# Patient Record
Sex: Female | Born: 1940 | Hispanic: No | Marital: Married | Smoking: Former smoker
Health system: Southern US, Community
[De-identification: ages and names within clinical notes are randomized; demographics above are authoritative.]

## PROBLEM LIST (undated history)

## (undated) DIAGNOSIS — Z9851 Tubal ligation status: Secondary | ICD-10-CM

## (undated) DIAGNOSIS — F32A Depression, unspecified: Secondary | ICD-10-CM

## (undated) DIAGNOSIS — Z9889 Other specified postprocedural states: Secondary | ICD-10-CM

## (undated) DIAGNOSIS — I219 Acute myocardial infarction, unspecified: Secondary | ICD-10-CM

## (undated) DIAGNOSIS — E039 Hypothyroidism, unspecified: Secondary | ICD-10-CM

## (undated) DIAGNOSIS — F0280 Dementia in other diseases classified elsewhere without behavioral disturbance: Secondary | ICD-10-CM

## (undated) DIAGNOSIS — F329 Major depressive disorder, single episode, unspecified: Secondary | ICD-10-CM

## (undated) DIAGNOSIS — E0590 Thyrotoxicosis, unspecified without thyrotoxic crisis or storm: Secondary | ICD-10-CM

## (undated) DIAGNOSIS — E538 Deficiency of other specified B group vitamins: Secondary | ICD-10-CM

## (undated) DIAGNOSIS — M1990 Unspecified osteoarthritis, unspecified site: Secondary | ICD-10-CM

## (undated) DIAGNOSIS — M810 Age-related osteoporosis without current pathological fracture: Secondary | ICD-10-CM

## (undated) DIAGNOSIS — F0390 Unspecified dementia without behavioral disturbance: Secondary | ICD-10-CM

## (undated) HISTORY — DX: Tubal ligation status: Z98.51

## (undated) HISTORY — DX: Dementia in other diseases classified elsewhere, unspecified severity, without behavioral disturbance, psychotic disturbance, mood disturbance, and anxiety: F02.80

## (undated) HISTORY — DX: Other specified postprocedural states: Z98.890

## (undated) HISTORY — PX: OTHER SURGICAL HISTORY: SHX169

## (undated) HISTORY — DX: Depression, unspecified: F32.A

## (undated) HISTORY — PX: TUBAL LIGATION: SHX77

## (undated) HISTORY — PX: REDUCTION MAMMAPLASTY: SUR839

## (undated) HISTORY — PX: BREAST SURGERY: SHX581

## (undated) HISTORY — DX: Age-related osteoporosis without current pathological fracture: M81.0

## (undated) HISTORY — DX: Major depressive disorder, single episode, unspecified: F32.9

## (undated) HISTORY — DX: Deficiency of other specified B group vitamins: E53.8

## (undated) HISTORY — PX: TONSILLECTOMY: SUR1361

## (undated) HISTORY — DX: Thyrotoxicosis, unspecified without thyrotoxic crisis or storm: E05.90

---

## 1997-11-13 ENCOUNTER — Ambulatory Visit (HOSPITAL_COMMUNITY): Admission: RE | Admit: 1997-11-13 | Discharge: 1997-11-13 | Payer: Self-pay | Admitting: Nephrology

## 1997-11-20 ENCOUNTER — Ambulatory Visit (HOSPITAL_COMMUNITY): Admission: RE | Admit: 1997-11-20 | Discharge: 1997-11-20 | Payer: Self-pay | Admitting: Nephrology

## 1998-07-06 ENCOUNTER — Encounter: Payer: Self-pay | Admitting: Nephrology

## 1998-07-06 ENCOUNTER — Ambulatory Visit (HOSPITAL_COMMUNITY): Admission: RE | Admit: 1998-07-06 | Discharge: 1998-07-06 | Payer: Self-pay | Admitting: Nephrology

## 1999-02-20 ENCOUNTER — Emergency Department (HOSPITAL_COMMUNITY): Admission: EM | Admit: 1999-02-20 | Discharge: 1999-02-20 | Payer: Self-pay | Admitting: Emergency Medicine

## 1999-02-20 ENCOUNTER — Encounter: Payer: Self-pay | Admitting: Emergency Medicine

## 1999-12-27 ENCOUNTER — Ambulatory Visit (HOSPITAL_BASED_OUTPATIENT_CLINIC_OR_DEPARTMENT_OTHER): Admission: RE | Admit: 1999-12-27 | Discharge: 1999-12-27 | Payer: Self-pay | Admitting: Orthopedic Surgery

## 2001-12-25 ENCOUNTER — Encounter: Payer: Self-pay | Admitting: Nephrology

## 2001-12-25 ENCOUNTER — Encounter: Admission: RE | Admit: 2001-12-25 | Discharge: 2001-12-25 | Payer: Self-pay | Admitting: Nephrology

## 2002-11-08 ENCOUNTER — Emergency Department (HOSPITAL_COMMUNITY): Admission: EM | Admit: 2002-11-08 | Discharge: 2002-11-08 | Payer: Self-pay | Admitting: Emergency Medicine

## 2002-11-08 ENCOUNTER — Encounter: Payer: Self-pay | Admitting: Emergency Medicine

## 2002-11-29 ENCOUNTER — Encounter: Payer: Self-pay | Admitting: Nephrology

## 2002-11-29 ENCOUNTER — Ambulatory Visit (HOSPITAL_COMMUNITY): Admission: RE | Admit: 2002-11-29 | Discharge: 2002-11-29 | Payer: Self-pay | Admitting: Nephrology

## 2002-12-09 ENCOUNTER — Encounter: Payer: Self-pay | Admitting: Cardiology

## 2002-12-09 ENCOUNTER — Ambulatory Visit (HOSPITAL_COMMUNITY): Admission: RE | Admit: 2002-12-09 | Discharge: 2002-12-09 | Payer: Self-pay | Admitting: Cardiology

## 2003-03-20 ENCOUNTER — Other Ambulatory Visit: Admission: RE | Admit: 2003-03-20 | Discharge: 2003-03-20 | Payer: Self-pay | Admitting: Gynecology

## 2003-11-12 ENCOUNTER — Ambulatory Visit (HOSPITAL_COMMUNITY): Admission: RE | Admit: 2003-11-12 | Discharge: 2003-11-12 | Payer: Self-pay | Admitting: Neurosurgery

## 2004-03-12 ENCOUNTER — Ambulatory Visit (HOSPITAL_COMMUNITY): Admission: RE | Admit: 2004-03-12 | Discharge: 2004-03-12 | Payer: Self-pay | Admitting: Nephrology

## 2004-03-22 ENCOUNTER — Other Ambulatory Visit: Admission: RE | Admit: 2004-03-22 | Discharge: 2004-03-22 | Payer: Self-pay | Admitting: Gynecology

## 2004-05-01 ENCOUNTER — Encounter: Admission: RE | Admit: 2004-05-01 | Discharge: 2004-05-01 | Payer: Self-pay | Admitting: Orthopedic Surgery

## 2005-03-31 ENCOUNTER — Other Ambulatory Visit: Admission: RE | Admit: 2005-03-31 | Discharge: 2005-03-31 | Payer: Self-pay | Admitting: Gynecology

## 2005-12-06 ENCOUNTER — Ambulatory Visit (HOSPITAL_COMMUNITY): Admission: RE | Admit: 2005-12-06 | Discharge: 2005-12-06 | Payer: Self-pay | Admitting: Gastroenterology

## 2005-12-06 ENCOUNTER — Encounter (INDEPENDENT_AMBULATORY_CARE_PROVIDER_SITE_OTHER): Payer: Self-pay | Admitting: Specialist

## 2006-04-03 ENCOUNTER — Other Ambulatory Visit: Admission: RE | Admit: 2006-04-03 | Discharge: 2006-04-03 | Payer: Self-pay | Admitting: Gynecology

## 2007-04-05 ENCOUNTER — Other Ambulatory Visit: Admission: RE | Admit: 2007-04-05 | Discharge: 2007-04-05 | Payer: Self-pay | Admitting: Gynecology

## 2010-03-31 ENCOUNTER — Encounter: Admission: RE | Admit: 2010-03-31 | Discharge: 2010-03-31 | Payer: Self-pay | Admitting: Nephrology

## 2010-10-08 NOTE — Op Note (Signed)
[place]  Patient:    [name], [name]                      MRN: [id number] Proc. Date: 12/27/99 Adm. Date:  [id number] Attending:  Aldean Baker V                           Operative Report  PREOPERATIVE DIAGNOSIS:  Metatarsalgia left foot second metatarsal head.  POSTOPERATIVE DIAGNOSIS:  Metatarsalgia left foot second metatarsal head.  PROCEDURE:  Shortening osteotomy left second metatarsal.  SURGEON:  Nadara Mustard, M.D.  ANESTHESIA:  General endotracheal anesthesia.  ESTIMATED BLOOD LOSS:  Minimal  ANTIBIOTICS:  1 gram of Kefzol.  DRAINS:  None.  COMPLICATIONS:  None.  TOURNIQUET TIME: With the Esmarch at the ankle at 30 minutes.  DISPOSITION:  To PACU in stable condition.  INDICATIONS FOR PROCEDURE:  The patient is a 70 year old woman who previously had undergone a bunion procedure for her left great toe.  She had a significant amount of shortening and dorsal angulation of the first metatarsal after her bunion procedure and subsequent to this has had increasing pain over the second metatarsal head.  The patient has undergone conservative care with wide shoes custom orthotics to unload the metatarsal heads and has been able to perform activities of daily living, unable to walk barefoot in the house and presents at this time for shortening osteotomy of the second metatarsal.  Risks and benefits were discussed including infection, neurovascular injury, persistent pain, nonunion at the osteotomy site, failure of the hardware, need for additional surgery.  The patient states she understands and wishes to proceed at this time.  DESCRIPTION OF PROCEDURE:  The patient was brought to outpatient room 2 and underwent a general anesthetic.  After adequate level of anesthesia was obtained the patients left lower extremity was prepped using DuraPrep and draped into a sterile field.  A dorsal incision was made over the second metatarsal.  The extensor tendons were reflected laterally and medially and an oblique osteotomy was made in the mid shaft with approximately 4 cm of bone resected.  The osteotomy was shortened and stabilized with lab screw technique and reinforced with a 20 gauge cerclage wire.  C-arm fluoroscopy showed good alignment and proper length of the metatarsal compared to the other bones. The wound was irrigated with normal saline.  The skin incision was closed using a vertical mattress with 2-0 nylon.  The wound was covered with Adaptic, orthopedic sponges, sterile Webril and a loosely wrapped Coban.  The patient was extubated and taken to PACU in stable condition. Total Esmarch time at the ankle approximately 30 minutes.  PLAN:  Followup in the office in 2 weeks, and nonweightbearing on the left lower extremity. DD:  12/27/99 TD:  12/28/99 Job: [id number]

## 2011-02-16 ENCOUNTER — Other Ambulatory Visit: Payer: Self-pay | Admitting: Nephrology

## 2011-02-16 ENCOUNTER — Ambulatory Visit
Admission: RE | Admit: 2011-02-16 | Discharge: 2011-02-16 | Disposition: A | Discharge status: Home / Self Care | Payer: Medicare HMO | Source: Ambulatory Visit | Attending: Nephrology | Admitting: Nephrology

## 2011-02-16 DIAGNOSIS — R059 Cough, unspecified: Secondary | ICD-10-CM

## 2011-02-16 DIAGNOSIS — R05 Cough: Secondary | ICD-10-CM

## 2011-10-14 ENCOUNTER — Ambulatory Visit: Payer: Medicare Other | Attending: Podiatry | Admitting: Physical Therapy

## 2011-10-14 DIAGNOSIS — M25676 Stiffness of unspecified foot, not elsewhere classified: Secondary | ICD-10-CM | POA: Insufficient documentation

## 2011-10-14 DIAGNOSIS — R262 Difficulty in walking, not elsewhere classified: Secondary | ICD-10-CM | POA: Insufficient documentation

## 2011-10-14 DIAGNOSIS — M25579 Pain in unspecified ankle and joints of unspecified foot: Secondary | ICD-10-CM | POA: Insufficient documentation

## 2011-10-14 DIAGNOSIS — IMO0001 Reserved for inherently not codable concepts without codable children: Secondary | ICD-10-CM | POA: Insufficient documentation

## 2011-10-14 DIAGNOSIS — M25673 Stiffness of unspecified ankle, not elsewhere classified: Secondary | ICD-10-CM | POA: Insufficient documentation

## 2011-10-19 ENCOUNTER — Ambulatory Visit: Payer: Medicare Other | Admitting: Physical Therapy

## 2011-10-21 ENCOUNTER — Ambulatory Visit: Payer: Medicare Other | Admitting: Physical Therapy

## 2011-10-24 ENCOUNTER — Ambulatory Visit: Payer: Medicare Other | Attending: Podiatry | Admitting: Physical Therapy

## 2011-10-24 DIAGNOSIS — M25673 Stiffness of unspecified ankle, not elsewhere classified: Secondary | ICD-10-CM | POA: Insufficient documentation

## 2011-10-24 DIAGNOSIS — M25676 Stiffness of unspecified foot, not elsewhere classified: Secondary | ICD-10-CM | POA: Insufficient documentation

## 2011-10-24 DIAGNOSIS — IMO0001 Reserved for inherently not codable concepts without codable children: Secondary | ICD-10-CM | POA: Insufficient documentation

## 2011-10-24 DIAGNOSIS — R262 Difficulty in walking, not elsewhere classified: Secondary | ICD-10-CM | POA: Insufficient documentation

## 2011-10-24 DIAGNOSIS — M25579 Pain in unspecified ankle and joints of unspecified foot: Secondary | ICD-10-CM | POA: Insufficient documentation

## 2011-10-26 ENCOUNTER — Ambulatory Visit: Payer: Medicare Other | Admitting: Physical Therapy

## 2011-10-28 ENCOUNTER — Ambulatory Visit: Payer: Medicare Other | Admitting: Physical Therapy

## 2011-10-31 ENCOUNTER — Ambulatory Visit: Payer: Medicare Other | Admitting: Physical Therapy

## 2011-11-02 ENCOUNTER — Ambulatory Visit: Payer: Medicare Other | Admitting: Physical Therapy

## 2011-11-07 ENCOUNTER — Ambulatory Visit: Payer: Medicare Other | Admitting: Physical Therapy

## 2011-11-09 ENCOUNTER — Ambulatory Visit: Payer: Medicare Other | Admitting: Physical Therapy

## 2011-11-11 ENCOUNTER — Ambulatory Visit: Payer: Medicare Other | Admitting: Physical Therapy

## 2011-11-16 ENCOUNTER — Encounter: Payer: Medicare HMO | Admitting: Physical Therapy

## 2011-11-18 ENCOUNTER — Encounter: Payer: Medicare HMO | Admitting: Physical Therapy

## 2011-12-07 ENCOUNTER — Other Ambulatory Visit: Payer: Self-pay | Admitting: Nephrology

## 2012-03-02 ENCOUNTER — Other Ambulatory Visit: Payer: Self-pay | Admitting: Obstetrics and Gynecology

## 2012-03-29 ENCOUNTER — Other Ambulatory Visit: Payer: Self-pay | Admitting: Orthopedic Surgery

## 2012-03-29 ENCOUNTER — Encounter (HOSPITAL_BASED_OUTPATIENT_CLINIC_OR_DEPARTMENT_OTHER): Payer: Self-pay | Admitting: *Deleted

## 2012-03-29 NOTE — Progress Notes (Signed)
Pt instructed npo p mn11/10.  X meds w sip of water.  To [facility] 11/11 @ 0715.  Needs hgb

## 2012-03-29 NOTE — H&P (Signed)
Jaime Dome Ernest is an 71 y.o. female.   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  She has failed nonoperative management and wished to proceed surgically.  Past Medical History  Diagnosis Date  . Hypothyroidism   . Dementia     Past Surgical History  Procedure Date  . Breast surgery     reduction    History reviewed. No pertinent family history. Social History:  reports that she has quit smoking. Her smoking use included Cigarettes. She does not have any smokeless tobacco history on file. She reports that she does not drink alcohol. Her drug history not on file.  Allergies:  Allergies  Allergen Reactions  . Asa (Aspirin) Nausea And Vomiting    No prescriptions prior to admission    No results found for this or any previous visit (from the past 48 hour(s)). No results found.  ROS  Height 5\' 4"  (1.626 m), weight 72.576 kg (160 lb). Physical Exam Constitutional:  WD, WN, NAD HEENT:  NCAT, EOMI Neuro/Psych:  Alert & oriented to person, place, and time; appropriate mood & affect Lymphatic: No generalized UE edema or lymphadenopathy Extremities / MSK:  Both UE are normal with respect to appearance, ranges of motion, joint stability, muscle strength/tone, sensation, & perfusion except as otherwise noted:  Left thumb is exquisitely tender with palpation at the [facility] joint, with positive stress and grind testing.  Crepitus is experienced.  Assessment/Plan Left thumb TMC osteoarthritis  These findings were discussed with the patient and her husband I proposed a traditional thumb suspension arthroplasty (LRTI).    The details of the operative procedure were discussed with the patient.  Specifically, we discussed the extended time line to full patient's satisfaction that is typically seen with this operation.  Questions were invited and answered.  In addition to the goal of  the procedure, the risks of the procedure to include but not limited to bleeding; infection; damage to the nerves or blood vessels that could result in bleeding, numbness, weakness, chronic pain, and the need for additional procedures; stiffness; the need for revision surgery; and anesthetic risks, the worst of which is death, were reviewed.  No specific outcome was guaranteed or implied.  Informed consent was obtained.  Prescriptions for postoperative analgesia were also written.  A return appointment 10-15 days postop was made.  Saintclair Schroader A. 03/29/2012, 3:12 PM

## 2012-04-02 ENCOUNTER — Ambulatory Visit (HOSPITAL_BASED_OUTPATIENT_CLINIC_OR_DEPARTMENT_OTHER): Payer: Medicare Other | Admitting: Anesthesiology

## 2012-04-02 ENCOUNTER — Encounter (HOSPITAL_BASED_OUTPATIENT_CLINIC_OR_DEPARTMENT_OTHER): Payer: Self-pay | Admitting: *Deleted

## 2012-04-02 ENCOUNTER — Encounter (HOSPITAL_BASED_OUTPATIENT_CLINIC_OR_DEPARTMENT_OTHER)
Admission: RE | Disposition: A | Discharge status: Home / Self Care | Payer: Self-pay | Source: Ambulatory Visit | Attending: Orthopedic Surgery

## 2012-04-02 ENCOUNTER — Encounter (HOSPITAL_BASED_OUTPATIENT_CLINIC_OR_DEPARTMENT_OTHER): Payer: Self-pay | Admitting: Anesthesiology

## 2012-04-02 ENCOUNTER — Ambulatory Visit (HOSPITAL_BASED_OUTPATIENT_CLINIC_OR_DEPARTMENT_OTHER)
Admission: RE | Admit: 2012-04-02 | Discharge: 2012-04-02 | Disposition: A | Discharge status: Home / Self Care | Payer: Medicare Other | Source: Ambulatory Visit | Attending: Orthopedic Surgery | Admitting: Orthopedic Surgery

## 2012-04-02 DIAGNOSIS — E039 Hypothyroidism, unspecified: Secondary | ICD-10-CM | POA: Insufficient documentation

## 2012-04-02 DIAGNOSIS — M19049 Primary osteoarthritis, unspecified hand: Secondary | ICD-10-CM | POA: Insufficient documentation

## 2012-04-02 HISTORY — DX: Hypothyroidism, unspecified: E03.9

## 2012-04-02 HISTORY — PX: FINGER ARTHROPLASTY: SHX5017

## 2012-04-02 HISTORY — DX: Unspecified dementia, unspecified severity, without behavioral disturbance, psychotic disturbance, mood disturbance, and anxiety: F03.90

## 2012-04-02 SURGERY — ARTHROPLASTY, FINGER
Anesthesia: General | Site: Thumb | Laterality: Left | Wound class: Clean

## 2012-04-02 MED ORDER — HYDROMORPHONE HCL PF 1 MG/ML IJ SOLN
0.5000 mg | INTRAMUSCULAR | Status: DC | PRN
  Filled 2012-04-02: qty 1

## 2012-04-02 MED ORDER — PROPOFOL 10 MG/ML IV BOLUS
INTRAVENOUS | Status: DC | PRN
  Administered 2012-04-02: 40 mg via INTRAVENOUS
  Administered 2012-04-02: 160 mg via INTRAVENOUS

## 2012-04-02 MED ORDER — LACTATED RINGERS IV SOLN
INTRAVENOUS | Status: DC
  Filled 2012-04-02: qty 1000

## 2012-04-02 MED ORDER — HYDROCODONE-ACETAMINOPHEN 5-325 MG PO TABS
1.0000 | ORAL_TABLET | ORAL | Status: DC | PRN
  Filled 2012-04-02: qty 2

## 2012-04-02 MED ORDER — ONDANSETRON HCL 4 MG/2ML IJ SOLN
4.0000 mg | Freq: Four times a day (QID) | INTRAMUSCULAR | Status: DC | PRN
  Filled 2012-04-02: qty 2

## 2012-04-02 MED ORDER — ONDANSETRON HCL 4 MG/2ML IJ SOLN
INTRAMUSCULAR | Status: DC | PRN
  Administered 2012-04-02: 4 mg via INTRAVENOUS

## 2012-04-02 MED ORDER — OXYCODONE-ACETAMINOPHEN 5-325 MG PO TABS
1.0000 | ORAL_TABLET | ORAL | Status: DC | PRN
Start: 2012-04-02 — End: 2012-04-02
  Filled 2012-04-02: qty 2

## 2012-04-02 MED ORDER — CEFAZOLIN SODIUM-DEXTROSE 2-3 GM-% IV SOLR
2.0000 g | INTRAVENOUS | Status: DC
  Filled 2012-04-02: qty 50

## 2012-04-02 MED ORDER — OXYCODONE-ACETAMINOPHEN 5-325 MG PO TABS
1.0000 | ORAL_TABLET | ORAL | Status: DC | PRN
  Administered 2012-04-02: 1 via ORAL
  Filled 2012-04-02: qty 2

## 2012-04-02 MED ORDER — CHLORHEXIDINE GLUCONATE 4 % EX LIQD
60.0000 mL | Freq: Once | CUTANEOUS | Status: DC
  Filled 2012-04-02: qty 60

## 2012-04-02 MED ORDER — DEXAMETHASONE SODIUM PHOSPHATE 4 MG/ML IJ SOLN
INTRAMUSCULAR | Status: DC | PRN
  Administered 2012-04-02: 10 mg via INTRAVENOUS

## 2012-04-02 MED ORDER — LACTATED RINGERS IV SOLN
INTRAVENOUS | Status: DC | PRN
  Administered 2012-04-02 (×2): via INTRAVENOUS

## 2012-04-02 MED ORDER — BUPIVACAINE-EPINEPHRINE PF 0.5-1:200000 % IJ SOLN
INTRAMUSCULAR | Status: DC | PRN
  Administered 2012-04-02: 10 mL

## 2012-04-02 MED ORDER — CEFAZOLIN SODIUM-DEXTROSE 2-3 GM-% IV SOLR
INTRAVENOUS | Status: DC | PRN
  Administered 2012-04-02: 2 g via INTRAVENOUS

## 2012-04-02 MED ORDER — FENTANYL CITRATE 0.05 MG/ML IJ SOLN
25.0000 ug | INTRAMUSCULAR | Status: DC | PRN
  Administered 2012-04-02 (×2): 0.25 ug via INTRAVENOUS
  Filled 2012-04-02: qty 1

## 2012-04-02 MED ORDER — LIDOCAINE HCL (CARDIAC) 20 MG/ML IV SOLN
INTRAVENOUS | Status: DC | PRN
  Administered 2012-04-02: 75 mg via INTRAVENOUS

## 2012-04-02 MED ORDER — SODIUM CHLORIDE 0.9 % IR SOLN
Status: DC | PRN
  Administered 2012-04-02: 500 mL

## 2012-04-02 MED ORDER — LACTATED RINGERS IV SOLN
INTRAVENOUS | Status: DC
  Administered 2012-04-02: 08:00:00 via INTRAVENOUS
  Filled 2012-04-02: qty 1000

## 2012-04-02 MED ORDER — FENTANYL CITRATE 0.05 MG/ML IJ SOLN
INTRAMUSCULAR | Status: DC | PRN
  Administered 2012-04-02 (×3): 25 ug via INTRAVENOUS
  Administered 2012-04-02: 50 ug via INTRAVENOUS
  Administered 2012-04-02 (×3): 25 ug via INTRAVENOUS

## 2012-04-02 SURGICAL SUPPLY — 55 items
BANDAGE CONFORM 3  STR LF (GAUZE/BANDAGES/DRESSINGS) ×2 IMPLANT
BANDAGE GAUZE ELAST BULKY 4 IN (GAUZE/BANDAGES/DRESSINGS) ×2 IMPLANT
BLADE AVERAGE 25X9 (BLADE) ×1 IMPLANT
BLADE MINI RND TIP GREEN BEAV (BLADE) ×1 IMPLANT
BLADE SURG 15 STRL LF DISP TIS (BLADE) ×1 IMPLANT
BLADE SURG 15 STRL SS (BLADE) ×2
BNDG CMPR 9X4 STRL LF SNTH (GAUZE/BANDAGES/DRESSINGS) ×1
BNDG COHESIVE 3X5 TAN STRL LF (GAUZE/BANDAGES/DRESSINGS) ×4 IMPLANT
BNDG COHESIVE 4X5 TAN NS LF (GAUZE/BANDAGES/DRESSINGS) ×1 IMPLANT
BNDG ESMARK 4X9 LF (GAUZE/BANDAGES/DRESSINGS) ×1 IMPLANT
CHLORAPREP W/TINT 26ML (MISCELLANEOUS) ×2 IMPLANT
CLOTH BEACON ORANGE TIMEOUT ST (SAFETY) ×2 IMPLANT
CORDS BIPOLAR (ELECTRODE) ×1 IMPLANT
COVER MAYO STAND STRL (DRAPES) ×4 IMPLANT
COVER TABLE BACK 60X90 (DRAPES) ×2 IMPLANT
DRAPE EXTREMITY T 121X128X90 (DRAPE) ×2 IMPLANT
DRAPE SURG 17X23 STRL (DRAPES) ×2 IMPLANT
DRSG EMULSION OIL 3X3 NADH (GAUZE/BANDAGES/DRESSINGS) ×2 IMPLANT
ELECT NDL BLADE 2-5/6 (NEEDLE) IMPLANT
ELECT NEEDLE BLADE 2-5/6 (NEEDLE) IMPLANT
GAUZE SPONGE 4X4 12PLY STRL LF (GAUZE/BANDAGES/DRESSINGS) ×1 IMPLANT
GLOVE BIO SURGEON STRL SZ7.5 (GLOVE) ×2 IMPLANT
GLOVE INDICATOR 8.0 STRL GRN (GLOVE) ×4 IMPLANT
GOWN PREVENTION PLUS LG XLONG (DISPOSABLE) ×2 IMPLANT
GOWN PREVENTION PLUS XLARGE (GOWN DISPOSABLE) ×2 IMPLANT
KWIRE 4.0 X .062IN (WIRE) ×1 IMPLANT
LOOP VESSEL MAXI BLUE (MISCELLANEOUS) IMPLANT
LOOP VESSEL MINI RED (MISCELLANEOUS) IMPLANT
NEEDLE HYPO 22GX1.5 SAFETY (NEEDLE) ×1 IMPLANT
NS IRRIG 500ML POUR BTL (IV SOLUTION) ×1 IMPLANT
PACK BASIN DAY SURGERY FS (CUSTOM PROCEDURE TRAY) ×2 IMPLANT
PAD CAST 3X4 CTTN HI CHSV (CAST SUPPLIES) ×1 IMPLANT
PAD CAST 4YDX4 CTTN HI CHSV (CAST SUPPLIES) ×1 IMPLANT
PADDING CAST ABS 3INX4YD NS (CAST SUPPLIES) ×1
PADDING CAST ABS 4INX4YD NS (CAST SUPPLIES) ×1
PADDING CAST ABS COTTON 3X4 (CAST SUPPLIES) ×1 IMPLANT
PADDING CAST ABS COTTON 4X4 ST (CAST SUPPLIES) ×1 IMPLANT
PADDING CAST COTTON 3X4 STRL (CAST SUPPLIES) ×2
PADDING CAST COTTON 4X4 STRL (CAST SUPPLIES)
SPLINT PLASTER CAST XFAST 3X15 (CAST SUPPLIES) IMPLANT
SPLINT PLASTER CAST XFAST 4X15 (CAST SUPPLIES) ×1 IMPLANT
SPLINT PLASTER XTRA FAST SET 4 (CAST SUPPLIES) ×1
SPLINT PLASTER XTRA FASTSET 3X (CAST SUPPLIES)
SPONGE GAUZE 4X4 12PLY (GAUZE/BANDAGES/DRESSINGS) ×2 IMPLANT
SUT ETHIBOND 3-0 V-5 (SUTURE) ×1 IMPLANT
SUT MERSILENE 4 0 P 3 (SUTURE) IMPLANT
SUT PDS AB 2-0 CT2 27 (SUTURE) IMPLANT
SUT STEEL 4 0 (SUTURE) IMPLANT
SUT VIC AB 2-0 PS2 27 (SUTURE) IMPLANT
SUT VICRYL 4-0 PS2 18IN ABS (SUTURE) IMPLANT
SUT VICRYL RAPID 5 0 P 3 (SUTURE) IMPLANT
SUT VICRYL RAPIDE 4/0 PS 2 (SUTURE) ×2 IMPLANT
SYRINGE 10CC LL (SYRINGE) ×2 IMPLANT
TUBE CONNECTING 12X1/4 (SUCTIONS) IMPLANT
UNDERPAD 30X30 INCONTINENT (UNDERPADS AND DIAPERS) ×2 IMPLANT

## 2012-04-02 NOTE — Op Note (Addendum)
04/02/2012  9:21 AM  PATIENT:  [name]  71 y.o. female  PRE-OPERATIVE DIAGNOSIS:  left thumb TMC osteoarthritis   POST-OPERATIVE DIAGNOSIS:  same  PROCEDURE:  Left thumb Suspension Arthroplasty (LRTI)  SURGEON:  Surgeon(s): Cliffton Asters. [name], MD  PHYSICIAN ASSISTANT: None  ANESTHESIA:  general  SPECIMENS:  None  DRAINS:   None  PREOPERATIVE INDICATIONS:  [name] is a  71 y.o. female with a diagnosis of left thumb TMC osteoarthritis  who failed conservative measures and elected for surgical management.    The risks benefits and alternatives were discussed with the patient preoperatively including but not limited to the risks of infection, bleeding, nerve injury, cardiopulmonary complications, the need for revision surgery, among others, and the patient verbalized understanding and consented to proceed.  OPERATIVE IMPLANTS: None  OPERATIVE FINDINGS: Eburnated bone at the [facility] joint, a focal chondral lesion of the scaphoid at the scaphotrapezial joint  OPERATIVE PROCEDURE: After receiving prophylactic antibiotics, the patient was escorted to the operative theatre and placed in a supine position. A surgical "time-out" was performed during which the planned procedure, proposed operative site, and the correct patient identity were compared to the operative consent and agreement confirmed by the circulating nurse according to current facility policy. Following application of a tourniquet to the operative extremity, the exposed skin was prepped with Chloraprep and draped in the usual sterile fashion. The limb was exsanguinated with an Esmarch bandage and the tourniquet inflated to approximately higher than systolic BP.  A Wagner incision was marked at the thumb as was an oblique incision over the FCR just distal to the muscle tendon junction. TMC joint was then made sharply with a scalpel. Subcutaneous tissues were dissected with blunt, spreading dissection with care to  identify and protect and preserve the cutaneous nerve branches that were retracted and the dorsal flap. The thenar muscles were reflected extra periosteally and then the longitudinal deep incision was made at the palmar edge of the abductor tendon, allowing some of the volar portion to go with the volar capsular reflection and the remainder with a dorsal capsular reflection. The [facility] joint was inspected and found to be badly arthritic. The trapezium was excised piecemeal. The scaphotrapezoid joint was inspected and found to be acceptable. The base of the metacarpal was resected with an oscillating saw and then the hole was made in the trapezium to accept the suspension. It was made with a 0.062 inch K wire first, followed by a 2.7 then 3.5 mm drill bit. A longitudinal half strip of the FCR was then harvested making an accessory incision more proximally. It was brought into the wound where it was then used to create a suspension for the metacarpal of the thumb, first bringing it through the tunnel that had been prepared, then passing it back down through the capsule between APL and EPB tendons. It was tied and a half hitch upon itself and this was secured with 3-0 Ethibond suture. Everything was again copiously irrigated and the remainder of the tendon fanfolded and secured with additional 3-0 suture that had been placed into the capsule and the depth of the wound. This allowed for retention of the interposition material within the resection space. The tourniquet was released and the distal hemostasis obtained. Wounds were again copiously irrigated and then the capsule at the [facility] joint was closed with 3-0 Ethibond suture. A 4-0 Vicryl Rapide suture was used to repair the reflected muscle of the thenar cone and then all  the skin was closed with 4-0 Vicryl Rapide sutures. The proximal 2 incisions were with running interrupted subcuticular suture, the most distal incision was with interrupted horizontal mattress and  simple sutures. Applied to the 2 proximal incisions and then 10 mL's of half percent Marcaine with epinephrine was instilled around the incisions for postoperative pain control. The hand dressing with a thumb spica plaster component was applied and the patient was awakened and taken to the recovery room in stable condition, breathing spontaneously   DISPOSITION: Will be discharged home today with typical postop instructions, returning in 10-15 days for reassessment. At that time her splint dressing should be removed and she will transition directly to hand therapy for fabrication of a custom forearm-based thumb spica splint and initiation of rehabilitation.

## 2012-04-02 NOTE — Anesthesia Procedure Notes (Signed)
Procedure Name: LMA Insertion Date/Time: 04/02/2012 9:30 AM Performed by: Jessica Priest Pre-anesthesia Checklist: Patient identified, Emergency Drugs available, Suction available and Patient being monitored Patient Re-evaluated:Patient Re-evaluated prior to inductionOxygen Delivery Method: Circle System Utilized Preoxygenation: Pre-oxygenation with 100% oxygen Intubation Type: IV induction Ventilation: Mask ventilation without difficulty LMA: LMA inserted LMA Size: 4.0 Number of attempts: 1 Airway Equipment and Method: bite block Placement Confirmation: positive ETCO2 Tube secured with: Tape Dental Injury: Teeth and Oropharynx as per pre-operative assessment

## 2012-04-02 NOTE — Interval H&P Note (Signed)
History and Physical Interval Note:  04/02/2012 9:00 AM  [name]  has presented today for surgery, with the diagnosis of left thumb TMC osteoarthritis   The various methods of treatment have been discussed with the patient and family. After consideration of risks, benefits and other options for treatment, the patient has consented to  Procedure(s) (LRB) with comments: FINGER ARTHROPLASTY (Left) - Left Thumb Suspension Arthroplasty  as a surgical intervention .  The patient's history has been reviewed, patient examined, no change in status, stable for surgery.  I have reviewed the patient's chart and labs.  Questions were answered to the patient's satisfaction.     [name] A.

## 2012-04-02 NOTE — Anesthesia Postprocedure Evaluation (Signed)
  Anesthesia Post-op Note  Patient: [name]  Procedure(s) Performed: Procedure(s) (LRB): FINGER ARTHROPLASTY (Left)  Patient Location: PACU  Anesthesia Type: General  Level of Consciousness: awake and alert   Airway and Oxygen Therapy: Patient Spontanous Breathing  Post-op Pain: mild  Post-op Assessment: Post-op Vital signs reviewed, Patient's Cardiovascular Status Stable, Respiratory Function Stable, Patent Airway and No signs of Nausea or vomiting  Post-op Vital Signs: stable  Complications: No apparent anesthesia complications

## 2012-04-02 NOTE — Transfer of Care (Signed)
Immediate Anesthesia Transfer of Care Note  Patient: [name]  Procedure(s) Performed: Procedure(s) (LRB): FINGER ARTHROPLASTY (Left)  Patient Location: PACU  Anesthesia Type: General  Level of Consciousness: awake, sedated, patient cooperative and responds to stimulation  Airway & Oxygen Therapy: Patient Spontanous Breathing and Patient connected to face mask oxygen  Post-op Assessment: Report given to PACU RN, Post -op Vital signs reviewed and stable and Patient moving all extremities  Post vital signs: Reviewed and stable  Complications: No apparent anesthesia complications

## 2012-04-02 NOTE — Anesthesia Preprocedure Evaluation (Addendum)
Anesthesia Evaluation  Patient identified by MRN, date of birth, ID band Patient awake    Reviewed: Allergy & Precautions, H&P , NPO status , Patient's Chart, lab work & pertinent test results  Airway Mallampati: II TM Distance: >3 FB Neck ROM: full    Dental No notable dental hx. (+) Teeth Intact and Dental Advisory Given   Pulmonary neg pulmonary ROS,  breath sounds clear to auscultation  Pulmonary exam normal       Cardiovascular Exercise Tolerance: Good negative cardio ROS  Rhythm:regular Rate:Normal     Neuro/Psych Mild dementia negative neurological ROS  negative psych ROS   GI/Hepatic negative GI ROS, Neg liver ROS,   Endo/Other  negative endocrine ROSHypothyroidism   Renal/GU negative Renal ROS  negative genitourinary   Musculoskeletal   Abdominal   Peds  Hematology negative hematology ROS (+)   Anesthesia Other Findings   Reproductive/Obstetrics negative OB ROS                          Anesthesia Physical Anesthesia Plan  ASA: II  Anesthesia Plan: General   Post-op Pain Management:    Induction: Intravenous  Airway Management Planned: LMA  Additional Equipment:   Intra-op Plan:   Post-operative Plan:   Informed Consent: I have reviewed the patients History and Physical, chart, labs and discussed the procedure including the risks, benefits and alternatives for the proposed anesthesia with the patient or authorized representative who has indicated his/her understanding and acceptance.   Dental Advisory Given  Plan Discussed with: CRNA and Surgeon  Anesthesia Plan Comments:         Anesthesia Quick Evaluation

## 2012-04-03 ENCOUNTER — Encounter (HOSPITAL_BASED_OUTPATIENT_CLINIC_OR_DEPARTMENT_OTHER): Payer: Self-pay | Admitting: Orthopedic Surgery

## 2012-04-03 LAB — POCT HEMOGLOBIN-HEMACUE: Hemoglobin: 13 g/dL (ref 12.0–15.0)

## 2012-06-13 ENCOUNTER — Other Ambulatory Visit: Payer: Self-pay | Admitting: Gastroenterology

## 2012-10-01 ENCOUNTER — Telehealth: Payer: Self-pay | Admitting: Neurology

## 2012-10-01 ENCOUNTER — Telehealth: Payer: Self-pay | Admitting: Nurse Practitioner

## 2012-10-01 MED ORDER — DONEPEZIL HCL 10 MG PO TABS: 10.0000 mg | ORAL_TABLET | Freq: Every day | ORAL | Status: DC

## 2012-10-01 MED ORDER — MEMANTINE HCL ER 28 MG PO CP24: 28.0000 mg | ORAL_CAPSULE | Freq: Every day | ORAL | Status: DC

## 2012-10-01 NOTE — Telephone Encounter (Signed)
I clarified what patient needed.  They said regular Namenda BID was d/c and they need a new rx for the Namenda XR once daily.  This is a 1:1 conversion and patients are able to go directly from one to the other.  The manufacturer is discontinuing the regular release version and XR will be the only form available.  I have updated Rx

## 2012-11-05 ENCOUNTER — Ambulatory Visit: Payer: Self-pay | Admitting: Nurse Practitioner

## 2012-12-04 ENCOUNTER — Other Ambulatory Visit: Payer: Self-pay | Admitting: Neurology

## 2012-12-31 ENCOUNTER — Encounter: Payer: Self-pay | Admitting: Nurse Practitioner

## 2012-12-31 ENCOUNTER — Ambulatory Visit (INDEPENDENT_AMBULATORY_CARE_PROVIDER_SITE_OTHER): Payer: Medicare Other | Admitting: Nurse Practitioner

## 2012-12-31 VITALS — BP 117/63 | HR 61 | Ht 65.0 in | Wt 180.0 lb

## 2012-12-31 DIAGNOSIS — D518 Other vitamin B12 deficiency anemias: Secondary | ICD-10-CM | POA: Insufficient documentation

## 2012-12-31 DIAGNOSIS — F0280 Dementia in other diseases classified elsewhere without behavioral disturbance: Secondary | ICD-10-CM

## 2012-12-31 DIAGNOSIS — G309 Alzheimer's disease, unspecified: Secondary | ICD-10-CM

## 2012-12-31 MED ORDER — DONEPEZIL HCL 10 MG PO TABS: 10.0000 mg | ORAL_TABLET | Freq: Every day | ORAL | Status: DC

## 2012-12-31 NOTE — Progress Notes (Signed)
  Reason for visit  Follow up for dementia. HPI: [name] is a 52 white female with a history  of a progressive memory disturbance. The patient has had ongoing problems with memory and depression. The patient has become a bit more moody, and the Zoloft was changed to Celexa.  The patient continues to read quite a bit. The patient has not given up any activities of daily living since last seen. She has stopped some  of her hobbies. Appetite is reportedly good and she sleeps well at night. She goes to [facility] twice weekly for 4 hours. The pt had side effects to the 23mg  of Aricept in the past so she is back to 10mg  daily. She could not tolerate axona in the past.She is also on Namenda XR 28mg . Care giver claims memory is stable.    ROS:  Negative except for feeling cold, memory loss, depression, and disinterest in activities   Medications Current Outpatient Prescriptions on File Prior to Visit  Medication Sig Dispense Refill  . alendronate (FOSAMAX) 10 MG tablet Take 10 mg by mouth daily before breakfast. Take with a full glass of water on an empty stomach.      . donepezil (ARICEPT) 10 MG tablet Take 1 tablet (10 mg total) by mouth daily.  30 tablet  1  . levothyroxine (SYNTHROID, LEVOTHROID) 100 MCG tablet Take 100 mcg by mouth daily.      Marland Kitchen lovastatin (MEVACOR) 20 MG tablet Take 20 mg by mouth at bedtime.      Marland Kitchen NAMENDA XR 28 MG CP24 TAKE ONE CAPSULE BY MOUTH DAILY  30 capsule  3   No current facility-administered medications on file prior to visit.    Allergies  Allergies  Allergen Reactions  . Asa (Aspirin) Nausea And Vomiting    Physical Exam General: well developed, well nourished, seated, in no evident distress Neurologic Exam Mental Status: Awake and  alert. MMSE 18/30 missing items in orientation and, concentration and recall. AFT 10.   Follows all commands. Speech and language normal.   Cranial Nerves: Pupils equal, briskly reactive to light. Extraocular movements full without  nystagmus. Visual fields full to confrontation. Hearing intact and symmetric to finger snap. Facial sensation intact. Face, tongue, palate move normally and symmetrically. Neck flexion and extension normal.  Motor: Normal bulk and tone. Normal strength in all tested extremity muscles.No focal weakness Coordination:  Finger-to-nose and heel-to-shin performed accurately bilaterally. Gait and Station: Arises from chair without difficulty. Stance is normal. Gait demonstrates normal stride length and balance . Able to heel, toe and tandem walk without difficulty. Romberg negative Reflexes: 1+ and symmetric. Toes downgoing.     ASSESSMENT: Dementia, currently on Aricept 10mg  daily and Namenda XR 28mg  daily     PLAN: Will continue Aricept and Namenda. Followup in 6 months Next appointment with Dr. Alda Ponder Darrol Angel, GNP-BC APRN

## 2012-12-31 NOTE — Patient Instructions (Addendum)
Pt will continue Aricept 10mg  daily Continue Namenda 28mg  daily F/U in 6 months

## 2012-12-31 NOTE — Progress Notes (Signed)
I have read the note, and I agree with the clinical assessment and plan.  

## 2013-04-01 ENCOUNTER — Encounter: Payer: Self-pay | Admitting: Podiatry

## 2013-04-01 ENCOUNTER — Ambulatory Visit (INDEPENDENT_AMBULATORY_CARE_PROVIDER_SITE_OTHER): Payer: Medicare Other | Admitting: Podiatry

## 2013-04-01 ENCOUNTER — Ambulatory Visit (INDEPENDENT_AMBULATORY_CARE_PROVIDER_SITE_OTHER): Payer: Medicare Other

## 2013-04-01 VITALS — BP 147/79 | HR 66 | Resp 16

## 2013-04-01 DIAGNOSIS — R52 Pain, unspecified: Secondary | ICD-10-CM

## 2013-04-01 DIAGNOSIS — S93609A Unspecified sprain of unspecified foot, initial encounter: Secondary | ICD-10-CM

## 2013-04-01 NOTE — Patient Instructions (Signed)
Wear a surgical shoe and reduce the amount of standing and walking. Reappoint in 3 weeks for repeat x-ray.

## 2013-04-01 NOTE — Progress Notes (Signed)
  Subjective:    Patient ID: [name], female    DOB: 01-Jul-1940, 72 y.o.   MRN: [id number]  HPIright foot has been going on for about 2 weeks and hurts on top and painful and hurts to walk  She presents with her husband complaining of right foot pain with walking no direct injury.    Review of Systems  Constitutional: Negative.   HENT: Negative.   Eyes: Negative.   Respiratory: Negative.   Cardiovascular: Negative.   Gastrointestinal: Negative.   Endocrine: Negative.   Genitourinary: Negative.   Musculoskeletal: Positive for neck pain.  Skin: Negative.   Allergic/Immunologic: Negative.   Neurological: Negative.   Hematological: Bruises/bleeds easily.  Psychiatric/Behavioral: Negative.        Objective:   Physical Exam Confused 72 year-old white female presents with her husband.  Vascular: DP and PT pulses are two over four bilaterally.  Neurological: Sensation intact  Dermatological low-grade erythema and edema noted in the dorsal second and third right MPJ area.  Musculoskeletal: Palpable tenderness in the shafts of the second and third right metatarsal areas without a palpable lesions.        Assessment & Plan:   Assessment: Suspect stress fracture right foot  Plan: Patient placed in surgical shoe with instructions to limit standing walking. Recheck x3 weeks and repeat x-ray right foot.  Richard C.Leeanne Deed, DPM

## 2013-04-08 ENCOUNTER — Other Ambulatory Visit: Payer: Self-pay | Admitting: Neurology

## 2013-04-22 ENCOUNTER — Ambulatory Visit (INDEPENDENT_AMBULATORY_CARE_PROVIDER_SITE_OTHER): Payer: Medicare Other | Admitting: Podiatry

## 2013-04-22 ENCOUNTER — Encounter: Payer: Self-pay | Admitting: Podiatry

## 2013-04-22 ENCOUNTER — Ambulatory Visit (INDEPENDENT_AMBULATORY_CARE_PROVIDER_SITE_OTHER): Payer: Medicare Other

## 2013-04-22 VITALS — BP 122/51 | HR 61 | Resp 18

## 2013-04-22 DIAGNOSIS — M79609 Pain in unspecified limb: Secondary | ICD-10-CM

## 2013-04-22 DIAGNOSIS — S92309A Fracture of unspecified metatarsal bone(s), unspecified foot, initial encounter for closed fracture: Secondary | ICD-10-CM

## 2013-04-22 NOTE — Progress Notes (Signed)
   Subjective:    Patient ID: [name], female    DOB: 11-Jul-1940, 72 y.o.   MRN: [id number]  HPI Still no better and hurts on top and bottom of my right foot. Patient presents with her husband for followup visit of 04/01/2013. At that time patient was placed in surgical shoe for a possible stress fracture in the right foot. She is wearing the surgical shoe, however her right foot is still uncomfortable.    Review of Systems     Objective:   Physical Exam A pleasant 72 year old white female presents with her husband.  Vascular: DP and PT pulses are two over four bilaterally.  Neurological: Sensation intact  Dermatological: Low-grade edema over the dorsal midfoot area the right foot.  Musculoskeletal: Palpable tenderness dorsal basis second right metatarsal, without any crepitus.  X-ray report see attached report, demonstrates fractured of the proximal one third of the second right metatarsal without any displacement.        Assessment & Plan:   Assessment: Stress fracture base of second right metatarsal without any displacement noted.  Plan: Patient and husband advised today that the x-rayed demonstrates a fracture in the second right metatarsal. Because patient is still uncomfortable when wearing a surgical shoe, I dispensed a Cam Walker boot for her to wear on the right leg and foot when walking. Also, advised to reduce the amount of standing walking. Reappoint x4 weeks for progress x-ray on the right foot.

## 2013-04-22 NOTE — Patient Instructions (Signed)
Wear boot on the right leg when walking. Please limit the amount of standing walking at any one time. Return x4 weeks for progress x-ray for fracture evaluation second metatarsal right foot.

## 2013-05-20 ENCOUNTER — Ambulatory Visit (INDEPENDENT_AMBULATORY_CARE_PROVIDER_SITE_OTHER): Payer: Medicare Other

## 2013-05-20 ENCOUNTER — Encounter: Payer: Self-pay | Admitting: Podiatry

## 2013-05-20 ENCOUNTER — Ambulatory Visit (INDEPENDENT_AMBULATORY_CARE_PROVIDER_SITE_OTHER): Payer: Medicare Other | Admitting: Podiatry

## 2013-05-20 VITALS — BP 122/60 | HR 61 | Resp 12 | Ht 64.0 in | Wt 180.0 lb

## 2013-05-20 DIAGNOSIS — S92309A Fracture of unspecified metatarsal bone(s), unspecified foot, initial encounter for closed fracture: Secondary | ICD-10-CM

## 2013-05-20 NOTE — Patient Instructions (Signed)
Wear boot an additional 2 weeks. Then continue to wear a surgical shoe until all the foot pain ends.

## 2013-05-21 NOTE — Progress Notes (Signed)
Patient ID: [name], female   DOB: 1941-01-14, 72 y.o.   MRN: [id number]  Subjective: Patient presents with her husband for followup care for stress fracture second right metatarsal under care since 04/01/2013.  Objective: Low-grade edema noted over the dorsal right midfoot area. Point tenderness over basis second right metatarsal without any palpable lesions or crepitus noted.  X-ray today demonstrates abundant callus formation around the fracture site without any angular deformity.  Assessment: Healing stress fractures base a second right metatarsal  Plan: Advised patient and and husband to continue wearing the Cam Walker boot additional 2 weeks and then transition into the surgical shoe and wear shoe until all pain subsides. Reappoint at patient's request

## 2013-05-28 ENCOUNTER — Other Ambulatory Visit: Payer: Self-pay

## 2013-05-28 MED ORDER — MEMANTINE HCL ER 28 MG PO CP24: 28.0000 mg | ORAL_CAPSULE | Freq: Every day | ORAL | Status: DC

## 2013-07-11 NOTE — Telephone Encounter (Signed)
Pt came in for her apt closing encounter

## 2013-07-19 ENCOUNTER — Telehealth: Payer: Self-pay | Admitting: Neurology

## 2013-07-19 ENCOUNTER — Ambulatory Visit: Payer: Medicare Other | Admitting: Neurology

## 2013-07-19 NOTE — Telephone Encounter (Signed)
Information was given to Dr. Willis assistant Sonya, CMA. °

## 2013-07-19 NOTE — Telephone Encounter (Signed)
Pt's husband called.  Pt had appointment on 2/27 @ 9:00.  They want an earlier appointment than the first available on August 19th.  Please call to discuss.  Thank you.

## 2013-07-22 ENCOUNTER — Encounter: Payer: Self-pay | Admitting: Neurology

## 2013-07-22 ENCOUNTER — Encounter (INDEPENDENT_AMBULATORY_CARE_PROVIDER_SITE_OTHER): Payer: Self-pay

## 2013-07-22 ENCOUNTER — Ambulatory Visit (INDEPENDENT_AMBULATORY_CARE_PROVIDER_SITE_OTHER): Payer: Medicare HMO | Admitting: Neurology

## 2013-07-22 VITALS — BP 127/67 | HR 73 | Wt 183.0 lb

## 2013-07-22 DIAGNOSIS — G309 Alzheimer's disease, unspecified: Principal | ICD-10-CM

## 2013-07-22 DIAGNOSIS — F0280 Dementia in other diseases classified elsewhere without behavioral disturbance: Secondary | ICD-10-CM

## 2013-07-22 MED ORDER — MEMANTINE HCL ER 28 MG PO CP24: 28.0000 mg | ORAL_CAPSULE | Freq: Every day | ORAL | Status: DC

## 2013-07-22 MED ORDER — DONEPEZIL HCL 10 MG PO TABS: 10.0000 mg | ORAL_TABLET | Freq: Every day | ORAL | Status: DC

## 2013-07-22 NOTE — Progress Notes (Signed)
Reason for visit: Memory disorder  Brandi SanesLydia F [name] is an 73 y.o. female  History of present illness:  [name] is a 73 year old right-handed white female with a history of a progressive memory disorder. The patient has had ongoing issues with memory. The patient has been on Aricept and Namenda. The patient has had some diarrhea on Aricept, but she is able to tolerate this. The patient indicates that in the past she was unable to tolerate Axona. The patient is not operating motor vehicle at this time. The patient needs assistance with keeping up with her medications and her appointments. The patient otherwise does all of her other activities of daily living. No significant changes in her day to day activities have occurred since last seen.  Past Medical History  Diagnosis Date  . Hypothyroidism   . Dementia   . Depression   . Osteoporosis   . Hx of cervical spine surgery   . Vitamin B12 deficiency   . H/O tubal ligation     Past Surgical History  Procedure Laterality Date  . Breast surgery      reduction  . Finger arthroplasty  04/02/2012    Procedure: FINGER ARTHROPLASTY;  Surgeon: Cliffton Astersavid A. Janee Mornhompson, MD;  [facility] [place];  Service: Orthopedics;  Laterality: Left;  Left Thumb Suspension Arthroplasty   . Bunionectomies      Family History  Problem Relation Age of Onset  . Cancer Mother     mesothelioma  . Cancer Father     mesothelioma  . Dementia Paternal Grandmother     Social history:  reports that she has quit smoking. Her smoking use included Cigarettes. She smoked 0.00 packs per day. She has never used smokeless tobacco. She reports that she does not drink alcohol or use illicit drugs.    Allergies  Allergen Reactions  . Asa [Aspirin] Nausea And Vomiting    Medications:  Current Outpatient Prescriptions on File Prior to Visit  Medication Sig Dispense Refill  . alendronate (FOSAMAX) 10 MG tablet Take 10 mg by mouth daily before  breakfast. Take with a full glass of water on an empty stomach.      . citalopram (CELEXA) 20 MG tablet Take 20 mg by mouth daily.      . cyanocobalamin 1000 MCG tablet Take 1,000 mcg by mouth daily.      Marland Kitchen. doxycycline (VIBRA-TABS) 100 MG tablet       . FLUZONE HIGH-DOSE injection       . levothyroxine (SYNTHROID, LEVOTHROID) 100 MCG tablet Take 75 mcg by mouth daily.        No current facility-administered medications on file prior to visit.    ROS:  Out of a complete 14 system review of symptoms, the patient complains only of the following symptoms, and all other reviewed systems are negative.  Activity change Memory loss Agitation  Blood pressure 127/67, pulse 73, weight 183 lb (83.008 kg).  Physical Exam  General: The patient is alert and cooperative at the time of the examination.  Skin: No significant peripheral edema is noted.   Neurologic Exam  Mental status: The Mini-Mental status examination done today shows a total score of 18/30.  Cranial nerves: Facial symmetry is present. Speech is normal, no aphasia or dysarthria is noted. Extraocular movements are full. Visual fields are full.  Motor: The patient has good strength in all 4 extremities.  Sensory examination: Soft touch sensation is symmetric on the face, arms, and legs.  Coordination:  The patient has good finger-nose-finger and heel-to-shin bilaterally.  Gait and station: The patient has a normal gait. Tandem gait is normal. Romberg is negative. No drift is seen.  Reflexes: Deep tendon reflexes are symmetric.   Assessment/Plan:  1. Memory disorder  The patient continues to have issues with memory. The patient has had very slow progression over time. The patient will continue her Aricept and Namenda, and prescriptions were given for these medications. The patient will followup in 9 months.  Marlan Palau MD 07/22/2013 7:06 PM  [facility] [address] [place],  [place] [address]  Phone [phone] Fax [phone]

## 2014-02-10 ENCOUNTER — Other Ambulatory Visit: Payer: Self-pay | Admitting: Gastroenterology

## 2014-02-10 DIAGNOSIS — R1013 Epigastric pain: Secondary | ICD-10-CM

## 2014-02-19 ENCOUNTER — Ambulatory Visit
Admission: RE | Admit: 2014-02-19 | Discharge: 2014-02-19 | Disposition: A | Discharge status: Home / Self Care | Payer: Medicare HMO | Source: Ambulatory Visit | Attending: Gastroenterology | Admitting: Gastroenterology

## 2014-02-19 DIAGNOSIS — R1013 Epigastric pain: Secondary | ICD-10-CM

## 2014-04-09 ENCOUNTER — Encounter: Payer: Self-pay | Admitting: Neurology

## 2014-04-15 ENCOUNTER — Encounter: Payer: Self-pay | Admitting: Neurology

## 2014-04-23 ENCOUNTER — Ambulatory Visit (INDEPENDENT_AMBULATORY_CARE_PROVIDER_SITE_OTHER): Payer: Medicare HMO | Admitting: Nurse Practitioner

## 2014-04-23 ENCOUNTER — Encounter: Payer: Self-pay | Admitting: Nurse Practitioner

## 2014-04-23 VITALS — BP 130/77 | HR 59 | Temp 98.5°F | Resp 16 | Ht 64.0 in | Wt 174.8 lb

## 2014-04-23 DIAGNOSIS — D518 Other vitamin B12 deficiency anemias: Secondary | ICD-10-CM

## 2014-04-23 DIAGNOSIS — F0280 Dementia in other diseases classified elsewhere without behavioral disturbance: Secondary | ICD-10-CM

## 2014-04-23 DIAGNOSIS — G309 Alzheimer's disease, unspecified: Secondary | ICD-10-CM

## 2014-04-23 NOTE — Patient Instructions (Signed)
Continue Aricept and Namenda at current doses does not need refills F/U in 6 months

## 2014-04-23 NOTE — Progress Notes (Signed)
[facility]  PATIENT: [name] DOB: 29-Oct-1940   REASON FOR VISIT: Follow-up for dementia  HISTORY OF PRESENT ILLNESS:[name] is a 73 year old right-handed white female with a history of a progressive memory disorder. She was last seen in this office by Dr. Anne [name] 07/22/2013. The patient has had ongoing issues with memory. The patient has been on Aricept and Namenda. The patient has had some diarrhea on Aricept, but she is able to tolerate this. The patient indicates that in the past she was unable to tolerate Axona. The patient is not operating motor vehicle at this time. The patient needs assistance with keeping up with her medications and her appointments. She can feed herself and dress herself after clothes have been laid out for her.  No significant changes in her day to day activities have occurred since last seen. She was recently taken off of her thyroid medication. She returns for reevaluation with her husband  REVIEW OF SYSTEMS: Full 14 system review of systems performed and notable only for those listed, all others are neg:  Constitutional: N/A  Cardiovascular: N/A  Ear/Nose/Throat: N/A  Skin: N/A  Eyes: N/A  Respiratory: N/A  Gastroitestinal: N/A  Hematology/Lymphatic: N/A  Endocrine: N/A Musculoskeletal: Muscle cramps  Allergy/Immunology: N/A  Neurological: Memory loss  Psychiatric: Decreased concentration  Sleep : NA   ALLERGIES: Allergies  Allergen Reactions  . Asa [Aspirin] Nausea And Vomiting    Bleeding ulcers    HOME MEDICATIONS: Outpatient Prescriptions Prior to Visit  Medication Sig Dispense Refill  . alendronate (FOSAMAX) 10 MG tablet Take 10 mg by mouth daily before breakfast. Take with a full glass of water on an empty stomach.    . citalopram (CELEXA) 20 MG tablet Take 20 mg by mouth daily.    . cyanocobalamin 1000 MCG tablet Take 1,000 mcg by mouth daily.    Marland Kitchen. donepezil (ARICEPT) 10 MG tablet Take 1 tablet (10 mg  total) by mouth daily. 90 tablet 3  . Memantine HCl ER (NAMENDA XR) 28 MG CP24 Take 28 mg by mouth daily. 90 capsule 3  . doxycycline (VIBRA-TABS) 100 MG tablet     . FLUZONE HIGH-DOSE injection     . levothyroxine (SYNTHROID, LEVOTHROID) 100 MCG tablet Take 75 mcg by mouth daily.      No facility-administered medications prior to visit.    PAST MEDICAL HISTORY: Past Medical History  Diagnosis Date  . Hypothyroidism   . Dementia   . Depression   . Osteoporosis   . Hx of cervical spine surgery   . Vitamin B12 deficiency   . H/O tubal ligation     PAST SURGICAL HISTORY: Past Surgical History  Procedure Laterality Date  . Breast surgery      reduction  . Finger arthroplasty  04/02/2012    Procedure: FINGER ARTHROPLASTY;  Surgeon: Brandi Astersavid A. Janee Mornhompson, MD;  [facility] [place];  Service: Orthopedics;  Laterality: Left;  Left Thumb Suspension Arthroplasty   . Bunionectomies      FAMILY HISTORY: Family History  Problem Relation Age of Onset  . Cancer Mother     mesothelioma  . Cancer Father     mesothelioma  . Dementia Paternal Grandmother     SOCIAL HISTORY: History   Social History  . Marital Status: Married    Spouse Name: N/A    Number of Children: 2  . Years of Education: college   Occupational History  . Retired    Social History Main Topics  .  Smoking status: Former Smoker    Types: Cigarettes  . Smokeless tobacco: Never Used  . Alcohol Use: No  . Drug Use: No  . Sexual Activity: Not on file   Other Topics Concern  . Not on file   Social History Narrative     PHYSICAL EXAM  Filed Vitals:   04/23/14 1513  BP: 130/77  Pulse: 59  Temp: 98.5 F (36.9 C)  TempSrc: Oral  Resp: 16  Height: 5\' 4"  (1.626 m)  Weight: 174 lb 12.8 oz (79.289 kg)   Body mass index is 29.99 kg/(m^2). General: The patient is alert and cooperative at the time of the examination. Skin: No significant peripheral edema is noted. Neurologic Exam Mental  status: The Mini-Mental status examination done today shows a total score of 10/30. Unable to draw a clock Cranial nerves: Facial symmetry is present. Speech is normal, no aphasia or dysarthria is noted. Extraocular movements are full. Visual fields are full. Motor: The patient has good strength in all 4 extremities. Sensory examination: Soft touch sensation is symmetric on the face, arms, and legs. Coordination: The patient has good finger-nose-finger and heel-to-shin bilaterally. Gait and station: The patient has a normal gait. Tandem gait is normal. Romberg is negative. No drift is seen. Reflexes: Deep tendon reflexes are symmetric.    DIAGNOSTIC DATA (LABS, IMAGING, TESTING) - ASSESSMENT AND PLAN  73 y.o. year old female  has a past medical history of Hypothyroidism; Dementia; Depression;  Vitamin B12 deficiency; . here to follow-up.  Continue Aricept and Namenda at current doses does not need refills F/U in 6 months [name], [facility] CenterGNP, [facility], APRN  [facility] [address], [address] [place], [place] [address] [phone]

## 2014-04-23 NOTE — Progress Notes (Signed)
I have read the note, and I agree with the clinical assessment and plan.  Corda Shutt KEITH   

## 2014-05-12 ENCOUNTER — Other Ambulatory Visit: Payer: Self-pay | Admitting: Family Medicine

## 2014-05-12 DIAGNOSIS — R7989 Other specified abnormal findings of blood chemistry: Secondary | ICD-10-CM

## 2014-05-13 ENCOUNTER — Ambulatory Visit
Admission: RE | Admit: 2014-05-13 | Discharge: 2014-05-13 | Disposition: A | Discharge status: Home / Self Care | Payer: Medicare HMO | Source: Ambulatory Visit | Attending: Family Medicine | Admitting: Family Medicine

## 2014-05-13 DIAGNOSIS — R7989 Other specified abnormal findings of blood chemistry: Secondary | ICD-10-CM

## 2014-06-23 DIAGNOSIS — E0590 Thyrotoxicosis, unspecified without thyrotoxic crisis or storm: Secondary | ICD-10-CM

## 2014-06-23 HISTORY — DX: Thyrotoxicosis, unspecified without thyrotoxic crisis or storm: E05.90

## 2014-07-28 ENCOUNTER — Other Ambulatory Visit (HOSPITAL_COMMUNITY): Payer: Self-pay | Admitting: Endocrinology

## 2014-07-28 DIAGNOSIS — E0590 Thyrotoxicosis, unspecified without thyrotoxic crisis or storm: Secondary | ICD-10-CM

## 2014-08-05 ENCOUNTER — Encounter (HOSPITAL_COMMUNITY)
Admission: RE | Admit: 2014-08-05 | Discharge: 2014-08-05 | Disposition: A | Discharge status: Home / Self Care | Payer: Medicare Other | Source: Ambulatory Visit | Attending: Endocrinology | Admitting: Endocrinology

## 2014-08-05 DIAGNOSIS — E0590 Thyrotoxicosis, unspecified without thyrotoxic crisis or storm: Secondary | ICD-10-CM | POA: Insufficient documentation

## 2014-08-05 MED ORDER — SODIUM IODIDE I 131 CAPSULE
12.0000 | Freq: Once | INTRAVENOUS | Status: AC | PRN
  Administered 2014-08-05: 12 via ORAL

## 2014-08-06 ENCOUNTER — Encounter (HOSPITAL_COMMUNITY)
Admission: RE | Admit: 2014-08-06 | Discharge: 2014-08-06 | Disposition: A | Discharge status: Home / Self Care | Payer: Medicare Other | Source: Ambulatory Visit | Attending: Endocrinology | Admitting: Endocrinology

## 2014-08-06 DIAGNOSIS — E0590 Thyrotoxicosis, unspecified without thyrotoxic crisis or storm: Secondary | ICD-10-CM | POA: Diagnosis not present

## 2014-08-06 MED ORDER — SODIUM PERTECHNETATE TC 99M INJECTION
10.0000 | Freq: Once | INTRAVENOUS | Status: AC | PRN
  Administered 2014-08-06: 10 via INTRAVENOUS

## 2014-08-20 ENCOUNTER — Other Ambulatory Visit: Payer: Self-pay | Admitting: Neurology

## 2014-10-29 ENCOUNTER — Ambulatory Visit (INDEPENDENT_AMBULATORY_CARE_PROVIDER_SITE_OTHER): Payer: Medicare Other | Admitting: Nurse Practitioner

## 2014-10-29 ENCOUNTER — Encounter: Payer: Self-pay | Admitting: Nurse Practitioner

## 2014-10-29 VITALS — BP 120/65 | HR 56 | Ht 64.0 in | Wt 178.5 lb

## 2014-10-29 DIAGNOSIS — G309 Alzheimer's disease, unspecified: Secondary | ICD-10-CM

## 2014-10-29 DIAGNOSIS — D518 Other vitamin B12 deficiency anemias: Secondary | ICD-10-CM | POA: Diagnosis not present

## 2014-10-29 DIAGNOSIS — F0280 Dementia in other diseases classified elsewhere without behavioral disturbance: Secondary | ICD-10-CM

## 2014-10-29 MED ORDER — DONEPEZIL HCL 10 MG PO TABS: 10.0000 mg | ORAL_TABLET | Freq: Every day | ORAL | Status: DC

## 2014-10-29 MED ORDER — MEMANTINE HCL ER 28 MG PO CP24: 28.0000 mg | ORAL_CAPSULE | Freq: Every day | ORAL | Status: DC

## 2014-10-29 NOTE — Progress Notes (Signed)
I have read the note, and I agree with the clinical assessment and plan.  [name]   

## 2014-10-29 NOTE — Patient Instructions (Signed)
Continue Aricept at current dose will refill Continue Namenda at current dose will refill Follow-up in 6 months 

## 2014-10-29 NOTE — Progress Notes (Signed)
[facility]  PATIENT: [name] DOB: [birth year]   REASON FOR VISIT: Follow-up for dementia HISTORY FROM: Patient and husband    HISTORY OF PRESENT ILLNESS:[name] is a 74 year old right-handed white female with a history of a progressive memory disorder. She was last seen in this office 04/23/14. The patient has had ongoing issues with memory. Husband claims her memory issues are stable. The patient has been on Aricept and Namenda. The patient has had some diarrhea initially on Aricept, but she is able to tolerate this. The patient indicates that in the past she was unable to tolerate Axona. The patient is not operating motor vehicle at this time. The patient needs assistance with keeping up with her medications and her appointments. She can feed herself and dress herself after clothes have been laid out for her. No significant changes in her day to day activities have occurred since last seen. She goes to the [facility] program 2 days a week for 8 hours so that her husband has some respite. She has excellent appetite and sleeps well without any wandering behavior. She returns for reevaluation   REVIEW OF SYSTEMS: Full 14 system review of systems performed and notable only for those listed, all others are neg:  Constitutional: neg  Cardiovascular: neg Ear/Nose/Throat: neg  Skin: neg Eyes: neg Respiratory: neg Gastroitestinal: neg  Hematology/Lymphatic: neg  Endocrine: Intolerance to heat and cold  Musculoskeletal: Occasional joint pain  Allergy/Immunology: neg Neurological: Memory loss Psychiatric: neg Sleep : neg   ALLERGIES: Allergies  Allergen Reactions  . Asa [Aspirin] Nausea And Vomiting    Bleeding ulcers    HOME MEDICATIONS: Outpatient Prescriptions Prior to Visit  Medication Sig Dispense Refill  . alendronate (FOSAMAX) 10 MG tablet Take 10 mg by mouth daily before breakfast. Take with a full glass of water on an empty stomach.    . citalopram  (CELEXA) 20 MG tablet Take 20 mg by mouth daily.    . cyanocobalamin 1000 MCG tablet Take 1,000 mcg by mouth daily.    Marland Kitchen. donepezil (ARICEPT) 10 MG tablet Take 1 tablet (10 mg total) by mouth daily. 90 tablet 3  . NAMENDA XR 28 MG CP24 24 hr capsule TAKE ONE CAPSULE BY MOUTH ONCE DAILY 90 capsule 0  . promethazine (PHENERGAN) 25 MG tablet Take 25 mg by mouth every 6 (six) hours as needed.   0  . esomeprazole (NEXIUM) 20 MG capsule Take 20 mg by mouth daily at 12 noon.     No facility-administered medications prior to visit.    PAST MEDICAL HISTORY: Past Medical History  Diagnosis Date  . Hypothyroidism   . Dementia   . Depression   . Osteoporosis   . Hx of cervical spine surgery   . Vitamin B12 deficiency   . H/O tubal ligation   . Hyperthyroidism 06-2014    PAST SURGICAL HISTORY: Past Surgical History  Procedure Laterality Date  . Breast surgery      reduction  . Finger arthroplasty  04/02/2012    Procedure: FINGER ARTHROPLASTY;  Surgeon: Cliffton Astersavid A. Janee Mornhompson, MD;  [facility] [place];  Service: Orthopedics;  Laterality: Left;  Left Thumb Suspension Arthroplasty   . Bunionectomies      FAMILY HISTORY: Family History  Problem Relation Age of Onset  . Cancer Mother     mesothelioma  . Cancer Father     mesothelioma  . Dementia Paternal Grandmother     SOCIAL HISTORY: History   Social History  .  Marital Status: Married    Spouse Name: N/A  . Number of Children: 2  . Years of Education: college   Occupational History  . Retired    Social History Main Topics  . Smoking status: Former Smoker    Types: Cigarettes  . Smokeless tobacco: Never Used  . Alcohol Use: No  . Drug Use: No  . Sexual Activity: Not on file   Other Topics Concern  . Not on file   Social History Narrative     PHYSICAL EXAM  Filed Vitals:   10/29/14 0856  BP: 120/65  Pulse: 56  Height:  (1.626 m)  Weight: 178 lb 8 oz (80.967 kg)   Body mass index is 30.62  kg/(m^2). General: The patient is alert and cooperative at the time of the examination. Skin: No significant peripheral edema is noted. Neurologic Exam Mental status: The Mini-Mental status examination done today shows a total score of 12/30. AFT 5. Unable to draw a clock. Cranial nerves: Facial symmetry is present. Speech is normal, no aphasia or dysarthria is noted. Extraocular movements are full. Visual fields are full. Motor: The patient has good strength in all 4 extremities. Sensory examination: Soft touch sensation is symmetric on the face, arms, and legs. Coordination: The patient has good finger-nose-finger and heel-to-shin bilaterally. Gait and station: The patient has a normal gait. Tandem gait is normal. Romberg is negative. No drift is seen. Reflexes: Deep tendon reflexes are symmetric.  DIAGNOSTIC DATA (LABS, IMAGING, TESTING) -  ASSESSMENT AND PLAN  74 y.o. year old female  has a past medical history of  Dementia; Depression;  Vitamin B12 deficiency; ; and Hyperthyroidism (06-2014). here to follow-up. Her memory is stable according to the husband  Continue Aricept at current dose will refill Continue Namenda at current dose will refill Follow-up in 6 months Nilda Riggs, [facility], [facility], APRN  [facility] [address], [address] [place], [place] [address] [phone]

## 2014-11-17 ENCOUNTER — Other Ambulatory Visit: Payer: Self-pay

## 2014-11-26 ENCOUNTER — Other Ambulatory Visit: Payer: Self-pay | Admitting: Family Medicine

## 2014-11-26 DIAGNOSIS — IMO0002 Reserved for concepts with insufficient information to code with codable children: Secondary | ICD-10-CM

## 2014-11-26 DIAGNOSIS — R229 Localized swelling, mass and lump, unspecified: Principal | ICD-10-CM

## 2014-12-05 ENCOUNTER — Ambulatory Visit
Admission: RE | Admit: 2014-12-05 | Discharge: 2014-12-05 | Disposition: A | Discharge status: Home / Self Care | Payer: Medicare Other | Source: Ambulatory Visit | Attending: Family Medicine | Admitting: Family Medicine

## 2014-12-05 ENCOUNTER — Other Ambulatory Visit: Payer: Self-pay | Admitting: Family Medicine

## 2014-12-05 DIAGNOSIS — IMO0002 Reserved for concepts with insufficient information to code with codable children: Secondary | ICD-10-CM

## 2014-12-05 DIAGNOSIS — R229 Localized swelling, mass and lump, unspecified: Principal | ICD-10-CM

## 2015-03-28 ENCOUNTER — Other Ambulatory Visit: Payer: Self-pay | Admitting: Orthopedic Surgery

## 2015-03-28 DIAGNOSIS — M542 Cervicalgia: Secondary | ICD-10-CM

## 2015-04-13 ENCOUNTER — Ambulatory Visit
Admission: RE | Admit: 2015-04-13 | Discharge: 2015-04-13 | Disposition: A | Discharge status: Home / Self Care | Payer: Medicare Other | Source: Ambulatory Visit | Attending: Orthopedic Surgery | Admitting: Orthopedic Surgery

## 2015-04-13 DIAGNOSIS — M542 Cervicalgia: Secondary | ICD-10-CM

## 2015-05-06 ENCOUNTER — Encounter: Payer: Self-pay | Admitting: Nurse Practitioner

## 2015-05-06 ENCOUNTER — Ambulatory Visit (INDEPENDENT_AMBULATORY_CARE_PROVIDER_SITE_OTHER): Payer: Medicare Other | Admitting: Nurse Practitioner

## 2015-05-06 VITALS — BP 114/62 | HR 60 | Ht 64.0 in | Wt 182.8 lb

## 2015-05-06 DIAGNOSIS — F0280 Dementia in other diseases classified elsewhere without behavioral disturbance: Secondary | ICD-10-CM | POA: Diagnosis not present

## 2015-05-06 DIAGNOSIS — D518 Other vitamin B12 deficiency anemias: Secondary | ICD-10-CM

## 2015-05-06 DIAGNOSIS — G309 Alzheimer's disease, unspecified: Secondary | ICD-10-CM

## 2015-05-06 NOTE — Progress Notes (Signed)
[facility]  PATIENT: Keely Drennan Delap DOB: 1940-06-29   REASON FOR VISIT: Follow-up for Alzheimer's dementia HISTORY FROM: Patient and husband    HISTORY OF PRESENT ILLNESS:Ms. Nawabi is a 74 year old right-handed white female with a history of a progressive memory disorder. She was last seen in this office 10/29/14. The patient has had ongoing issues with memory. Husband claims her memory issues are stable. The patient has been on Aricept and Namenda. The patient has had some diarrhea initially on Aricept, but this is no longer a problem.. The husband  indicates that in the past she was unable to tolerate Axona. The patient is not operating motor vehicle at this time. The patient needs assistance with keeping up with her medications and her appointments. She can feed herself and dress herself after clothes have been laid out for her. No significant changes in her day to day activities have occurred since last seen. She goes to the [employer] program 3 days a week for 8 hours so that her husband has some respite. She has excellent appetite and sleeps well without any wandering behavior. She returns for reevaluation .    REVIEW OF SYSTEMS: Full 14 system review of systems performed and notable only for those listed, all others are neg:  Constitutional: neg  Cardiovascular: neg Ear/Nose/Throat: neg  Skin: neg Eyes: neg Respiratory: neg Gastroitestinal: neg  Hematology/Lymphatic: neg  Endocrine: Intolerance to cold Musculoskeletal:neg Allergy/Immunology: neg Neurological: Memory loss Psychiatric: neg Sleep : neg   ALLERGIES: Allergies  Allergen Reactions  . Asa [Aspirin] Nausea And Vomiting    Bleeding ulcers    HOME MEDICATIONS: Outpatient Prescriptions Prior to Visit  Medication Sig Dispense Refill  . alendronate (FOSAMAX) 10 MG tablet Take 10 mg by mouth daily before breakfast. Take with a full glass of water on an empty stomach.    . calcium carbonate  (OS-CAL - DOSED IN MG OF ELEMENTAL CALCIUM) 1250 (500 CA) MG tablet Take 1 tablet by mouth. With 1000 units vit D.    . citalopram (CELEXA) 20 MG tablet Take 20 mg by mouth daily.    . cyanocobalamin 1000 MCG tablet Take 1,000 mcg by mouth daily.    Marland Kitchen donepezil (ARICEPT) 10 MG tablet Take 1 tablet (10 mg total) by mouth daily. 90 tablet 3  . famotidine (PEPCID) 20 MG tablet Take 20 mg by mouth daily.    . memantine (NAMENDA XR) 28 MG CP24 24 hr capsule Take 1 capsule (28 mg total) by mouth daily. 90 capsule 3  . promethazine (PHENERGAN) 25 MG tablet Take 25 mg by mouth every 6 (six) hours as needed.   0  . methimazole (TAPAZOLE) 10 MG tablet Reported on 05/06/2015     No facility-administered medications prior to visit.    PAST MEDICAL HISTORY: Past Medical History  Diagnosis Date  . Hypothyroidism   . Dementia   . Depression   . Osteoporosis   . Hx of cervical spine surgery   . Vitamin B12 deficiency   . H/O tubal ligation   . Hyperthyroidism 06-2014    PAST SURGICAL HISTORY: Past Surgical History  Procedure Laterality Date  . Breast surgery      reduction  . Finger arthroplasty  04/02/2012    Procedure: FINGER ARTHROPLASTY;  Surgeon: Cliffton Asters. [name], MD;  Location: [facility];  Service: Orthopedics;  Laterality: Left;  Left Thumb Suspension Arthroplasty   . Bunionectomies      FAMILY HISTORY: Family History  Problem Relation  Age of Onset  . Cancer Mother     mesothelioma  . Cancer Father     mesothelioma  . Dementia Paternal Grandmother     SOCIAL HISTORY: Social History   Social History  . Marital Status: Married    Spouse Name: N/A  . Number of Children: 2  . Years of Education: college   Occupational History  . Retired    Social History Main Topics  . Smoking status: Former Smoker    Types: Cigarettes  . Smokeless tobacco: Never Used  . Alcohol Use: No  . Drug Use: No  . Sexual Activity: Not on file   Other Topics Concern  . Not  on file   Social History Narrative     PHYSICAL EXAM  Filed Vitals:   05/06/15 0840  BP: 114/62  Pulse: 60  Height: 5\' 4"  (1.626 m)  Weight: 182 lb 12.8 oz (82.918 kg)   Body mass index is 31.36 kg/(m^2). General: The patient is alert and cooperative at the time of the examination. Skin: No significant peripheral edema is noted. Neurologic Exam Mental status: The Mini-Mental status examination done today shows a total score of 9/30. AFT 4. Unable to draw a clock. Cranial nerves: Facial symmetry is present. Speech is normal, no aphasia or dysarthria but word finding difficulty is noted  Extraocular movements are full. Visual fields are full. Motor: The patient has good strength in all 4 extremities. No focal weakness Sensory examination: Soft touch sensation is symmetric on the face, arms, and legs. Coordination: The patient has good finger-nose-finger and heel-to-shin bilaterally. Gait and station: The patient has a normal gait. Tandem gait is mildly unsteady . Romberg is negative. No drift is seen. Reflexes: Deep tendon reflexes are symmetric.   DIAGNOSTIC DATA (LABS, IMAGING, TESTING) -ASSESSMENT AND PLAN  74 y.o. year old female  has a past medical history of  Dementia; Depression; Osteoporosis; cervical spine surgery; Vitamin B12 deficiency;  here to follow-up.  Continue Aricept at current dose  Continue Namenda at current dose  Exercise memory, continue exercise at [facility] program Given reference material on dementia and discussed creating a safety environment in the home, reducing confusion, using effective communication with simple words, calm voice, reduce nighttime restlessness, good nutrition and hydration, seek medical attention for any infection or worsening of acute confusion  Follow-up in 6 monthsVst time 25 min Nilda RiggsNancy Carolyn Bob Eastwood, [facility]GNP, [facility], APRN  [facility] [address], [address] [place], [place] [address] [phone]

## 2015-05-06 NOTE — Patient Instructions (Addendum)
Continue Aricept at current dose  Continue Namenda at current dose  Exercise memory, continue exercise at [facility] program Given reference material Follow-up in 6 months

## 2015-05-06 NOTE — Progress Notes (Signed)
I have read the note, and I agree with the clinical assessment and plan.  WILLIS,CHARLES KEITH   

## 2015-06-12 ENCOUNTER — Ambulatory Visit: Payer: Medicare Other | Attending: Orthopedic Surgery | Admitting: Physical Therapy

## 2015-06-12 DIAGNOSIS — M25511 Pain in right shoulder: Secondary | ICD-10-CM

## 2015-06-12 DIAGNOSIS — M6281 Muscle weakness (generalized): Secondary | ICD-10-CM | POA: Diagnosis present

## 2015-06-12 DIAGNOSIS — M25611 Stiffness of right shoulder, not elsewhere classified: Secondary | ICD-10-CM | POA: Diagnosis present

## 2015-06-12 NOTE — Therapy (Signed)
[facility] [address] [place], [place], [address] Phone: [phone]   Fax:  [phone]  Physical Therapy Evaluation  Patient Details  Name: [name] MRN: [id number] Date of Birth: 07/27/1940 Referring Provider: Jason Coop PA (Dr. Ave Filter)  Encounter Date: 06/12/2015      PT End of Session - 06/12/15 1214    Visit Number 1   Number of Visits 10   Date for PT Re-Evaluation 08/07/15   Authorization Type Medicare G codes; KX at 15   PT Start Time 0847   PT Stop Time 0940   PT Time Calculation (min) 53 min   Activity Tolerance Patient tolerated treatment well      Past Medical History  Diagnosis Date  . Hypothyroidism   . Dementia   . Depression   . Osteoporosis   . Hx of cervical spine surgery   . Vitamin B12 deficiency   . H/O tubal ligation   . Hyperthyroidism 06-2014    Past Surgical History  Procedure Laterality Date  . Breast surgery      reduction  . Finger arthroplasty  04/02/2012    Procedure: FINGER ARTHROPLASTY;  Surgeon: Cliffton Asters. [name], MD;  Location: [facility];  Service: Orthopedics;  Laterality: Left;  Left Thumb Suspension Arthroplasty   . Bunionectomies      There were no vitals filed for this visit.  Visit Diagnosis:  Right shoulder pain - Plan: PT plan of care cert/re-cert  Shoulder stiffness, right - Plan: PT plan of care cert/re-cert  Muscle weakness of right upper extremity - Plan: PT plan of care cert/re-cert      Subjective Assessment - 06/12/15 0856    Subjective Right shoulder pain, anterior 6 months.  Thought it was referred from neck but Had a neck nerve block no change. No referred to shoulder doctor in 2 weeks .  Hurts to move it.  Diff putting arm in shirt.     Patient is accompained by: Family member   Pertinent History Alzheimer's ; osteoporosis;  hx of shoulder dislocation right 20 years ago; neck fusion 20 years ago   Diagnostic  tests shoulder x-ray some OA   Patient Stated Goals not have pain   Currently in Pain? Yes   Pain Score 7    Pain Location Shoulder   Pain Orientation Right   Pain Type Chronic pain   Pain Onset More than a month ago   Pain Frequency Intermittent   Aggravating Factors  no patterns on movement, even at rest sometimes   Pain Relieving Factors rubbing it.              [facility] PT Assessment - 06/12/15 0902    Assessment   Medical Diagnosis right shoulder pain   Referring Provider Jason Coop PA  Dr. Ave Filter   Onset Date/Surgical Date --  6 months   Hand Dominance Right   Next MD Visit 2 weeks Dr. Ave Filter   Prior Therapy back and neck recently   Precautions   Precautions None   Restrictions   Weight Bearing Restrictions No   Balance Screen   Has the patient fallen in the past 6 months Yes   How many times? 1  in parking lot no injury   Has the patient had a decrease in activity level because of a fear of falling?  No   Is the patient reluctant to leave their home because of a fear of falling?  No  Home Environment   Living Environment Private residence   Living Arrangements Spouse/significant other   Available Help at Discharge Family   Type of Home House   Home Access Stairs to enter   Entrance Stairs-Number of Steps 2   Prior Function   Level of Independence Independent with basic ADLs   Vocation Retired   Leisure reading, [employer] play games   Cognition   Overall Cognitive Status History of cognitive impairments - at baseline   Observation/Other Assessments   Focus on Therapeutic Outcomes (FOTO)  33% limitation   AROM   AROM Assessment Site Shoulder   Right/Left Shoulder Right;Left   Right Shoulder Flexion 150 Degrees   Right Shoulder ABduction 165 Degrees   Right Shoulder Internal Rotation --  L5   Right Shoulder External Rotation 85 Degrees   Left Shoulder Flexion 175 Degrees   Left Shoulder ABduction 175 Degrees   Left Shoulder Internal Rotation --  T4    Left Shoulder External Rotation 95 Degrees   Strength   Strength Assessment Site Shoulder   Right/Left Shoulder Right;Left   Right Shoulder Flexion 4-/5  pain endrange flexion in supine   Right Shoulder Extension 4-/5   Right Shoulder ABduction 4-/5   Right Shoulder Internal Rotation 4-/5   Right Shoulder External Rotation 4-/5   Left Shoulder Flexion 4+/5   Special Tests    Special Tests Rotator Cuff Impingement   Rotator Cuff Impingment tests HawkinsKyung Rudd test;Empty Can test;Drop Arm test   Hawkins-Kennedy test   Findings Positive   Side Right   Empty Can test   Findings Negative   Drop Arm test   Findings Negative                   [facility] Adult PT Treatment/Exercise - 06/12/15 0001    Moist Heat Therapy   Number Minutes Moist Heat 10 Minutes   Moist Heat Location Shoulder                PT Education - 06/12/15 1213    Education provided Yes   Education Details supine cane flexion; shoulder isometrics   Person(s) Educated Patient;Spouse   Methods Explanation;Demonstration;Handout   Comprehension Verbalized understanding;Returned demonstration          PT Short Term Goals - 06/12/15 1223    PT SHORT TERM GOAL #1   Title The patient/husband will have basic self management strategies for pain control including heat/ice   07/10/15   Time 4   Period Weeks   Status New   PT SHORT TERM GOAL #2   Title Right shoulder flexion to 155 degrees needed for putting on a pullover shirt.   Time 4   Period Weeks   Status New   PT SHORT TERM GOAL #3   Title The patient/husband will report a 25% reduction in overall daily pain   Time 4   Period Weeks   Status New           PT Long Term Goals - 06/12/15 1225    PT LONG TERM GOAL #1   Title The patient and husband will be independent in HEP for ROM, strength and pain control strategies  08/07/15   Time 8   Period Weeks   Status New   PT LONG TERM GOAL #2   Title The patient will have improved  shoulder flexion to 160 needed for reaching shoulder height shelves    Time 8   Period Weeks   Status New  PT LONG TERM GOAL #3   Title The patient will have improved shoulder strength to 4/5 needed for carrying light household objects with min complaint of pain   Time 8   Period Weeks   Status New   PT LONG TERM GOAL #4   Title Patient and husband will report an overall 50% improvement in pain/function   Time 8   Period Weeks   Status New               Plan - 2015/06/23 1216    Clinical Impression Statement The patient is of moderate complexity evaluation.  She presents with her husband with a referral for right shoulder pain for 6 months.  The patient has Alzheimers and therefore her husband relays her history.  Her shoulder pain is intermittent with no known movements that would provoke.  Can have pain at  rest.  She is able to dress and groom herself.  Pain is primarily anterior /prox biceps tendon region.  X-rays show OA, referred to see orthopedist in 1 week.  Decreased shoulder flexion 150 degrees and IR to L5 region.  Decreased shoulder strength grossly 4-/5.  She would benefit from PT to address pain and deficitis.     Pt will benefit from skilled therapeutic intervention in order to improve on the following deficits Decreased activity tolerance;Decreased range of motion;Decreased strength;Pain;Impaired UE functional use   Rehab Potential Good   Clinical Impairments Affecting Rehab Potential Alzheimers; osteoporosis   PT Frequency 2x / week   PT Duration 8 weeks   PT Treatment/Interventions ADLs/Self Care Home Management;Cryotherapy;Electrical Stimulation;Moist Heat;Iontophoresis /ml Dexamethasone;Ultrasound;Therapeutic exercise;Patient/family education;Taping;Manual techniques   PT Next Visit Plan ionto if cert signed;  review isometrics; shoulder AROM/AAROM as tolerated;  Kinesiotaping; e-stim/moist heat/ice   PT Home Exercise Plan supine cane flexion; shoulder  isometrics          G-Codes - 06/23/2015 1231    Functional Assessment Tool Used FOTO clinical judgement   Functional Limitation Carrying, moving and handling objects   Carrying, Moving and Handling Objects Current Status (Z6109) At least 20 percent but less than 40 percent impaired, limited or restricted   Carrying, Moving and Handling Objects Goal Status (U0454) At least 20 percent but less than 40 percent impaired, limited or restricted       Problem List Patient Active Problem List   Diagnosis Date Noted  . Alzheimer's disease 12/31/2012  . Other vitamin B12 deficiency anemia 12/31/2012   Lavinia Sharps, PT 06-23-2015 12:34 PM Phone: [phone] Fax: [phone] Vivien Presto 23-Jun-2015, 12:33 PM  [facility] [address] [place], [place], [address] Phone: [phone]   Fax:  [phone]  Name: [name] MRN: [id number] Date of Birth: 1941/05/17

## 2015-06-12 NOTE — Patient Instructions (Signed)
Strengthening: Isometric Flexion  Using wall for resistance, press right fist into ball using light pressure. Hold _5___ seconds. Repeat __5__ times per set. Do __1__ sets per session. Do _1___ sessions per day.  SHOULDER: Abduction (Isometric)  Use wall as resistance. Press arm against pillow. Keep elbow straight. Hold ___5 seconds. __5_ reps per set, _1__ sets per day, __7_ days per week  Extension (Isometric)  Place left bent elbow and back of arm against wall. Press elbow against wall. Hold __4__ seconds. Repeat ___5_ times. Do __1__ sessions per day.  Internal Rotation (Isometric)  Place palm of right fist against door frame, with elbow bent. Press fist against door frame. Hold ____ seconds. Repeat ____ times. Do ____ sessions per day.  External Rotation (Isometric)  Place back of left fist against door frame, with elbow bent. Press fist against door frame. Hold _5___ seconds. Repeat _5___ times. Do __1__ sessions per day.  Copyright  VHI. All rights reserved.

## 2015-06-15 ENCOUNTER — Encounter: Payer: Self-pay | Admitting: Physical Therapy

## 2015-06-15 ENCOUNTER — Ambulatory Visit: Payer: Medicare Other | Admitting: Physical Therapy

## 2015-06-15 DIAGNOSIS — M25511 Pain in right shoulder: Secondary | ICD-10-CM

## 2015-06-15 DIAGNOSIS — M25611 Stiffness of right shoulder, not elsewhere classified: Secondary | ICD-10-CM

## 2015-06-15 DIAGNOSIS — M6281 Muscle weakness (generalized): Secondary | ICD-10-CM

## 2015-06-15 NOTE — Therapy (Signed)
[facility] [address] [place], [place], [address] Phone: [phone]   Fax:  [phone]  Physical Therapy Treatment  Patient Details  Name: [name] MRN: [id number] Date of Birth: [birth year] Referring Provider: Jason Coop PA (Dr. Ave Filter)  Encounter Date: 06/15/2015      PT End of Session - 06/15/15 1157    Visit Number 2   Number of Visits 10   Date for PT Re-Evaluation 08/07/15   Authorization Type Medicare G codes; KX at 15   PT Start Time 1146   PT Stop Time 1230   PT Time Calculation (min) 44 min   Activity Tolerance Patient tolerated treatment well      Past Medical History  Diagnosis Date  . Hypothyroidism   . Dementia   . Depression   . Osteoporosis   . Hx of cervical spine surgery   . Vitamin B12 deficiency   . H/O tubal ligation   . Hyperthyroidism 06-2014    Past Surgical History  Procedure Laterality Date  . Breast surgery      reduction  . Finger arthroplasty  04/02/2012    Procedure: FINGER ARTHROPLASTY;  Surgeon: Cliffton Asters. [name], MD;  Location: [facility];  Service: Orthopedics;  Laterality: Left;  Left Thumb Suspension Arthroplasty   . Bunionectomies      There were no vitals filed for this visit.  Visit Diagnosis:  Right shoulder pain  Shoulder stiffness, right  Muscle weakness of right upper extremity      Subjective Assessment - 06/15/15 1156    Subjective Pt reports right shoulder hurts at times in deltoid area and feels stiff this morning.    Patient is accompained by: Family member  husband in waiting area   Pertinent History Alzheimer's ; osteoporosis;  hx of shoulder dislocation right 20 years ago; neck fusion 20 years ago   Diagnostic tests shoulder x-ray some OA   Patient Stated Goals not have pain   Currently in Pain? No/denies   Pain Onset More than a month ago   Multiple Pain Sites No                          [facility] Adult PT Treatment/Exercise - 06/15/15 0001    Exercises   Exercises Shoulder   Shoulder Exercises: Seated   Flexion Strengthening;20 reps  with cane bil in sitting 2 x 10   Shoulder Exercises: Standing   Flexion AAROM;20 reps   ABduction AAROM;10 reps   Shoulder Exercises: Pulleys   Flexion 3 minutes   ABduction 3 minutes   Modalities   Modalities Ultrasound   Ultrasound   Ultrasound Location Lt ant shoulder   Ultrasound Parameters 50%, , 0,8W/cm   Ultrasound Goals Pain   Manual Therapy   Manual Therapy Soft tissue mobilization   Manual therapy comments TP   Soft tissue mobilization STW to Rt deltoid, TP release on biceps                PT Education - 06/15/15 1231    Education provided Yes   Education Details Standing flexion along wall with Rt UE   Person(s) Educated Patient;Spouse   Methods Explanation;Demonstration;Handout   Comprehension Returned demonstration          PT Short Term Goals - 06/15/15 1207    PT SHORT TERM GOAL #1   Title The patient/husband will have basic self management strategies for pain control including  heat/ice   07/10/15   Time 4   Period Weeks   Status On-going   PT SHORT TERM GOAL #2   Title Right shoulder flexion to 155 degrees needed for putting on a pullover shirt.   Time 4   Period Weeks   Status On-going   PT SHORT TERM GOAL #3   Title The patient/husband will report a 25% reduction in overall daily pain   Time 4   Period Weeks   Status On-going           PT Long Term Goals - 06/12/15 1225    PT LONG TERM GOAL #1   Title The patient and husband will be independent in HEP for ROM, strength and pain control strategies  08/07/15   Time 8   Period Weeks   Status New   PT LONG TERM GOAL #2   Title The patient will have improved shoulder flexion to 160 needed for reaching shoulder height shelves    Time 8   Period Weeks   Status New   PT LONG TERM GOAL #3   Title The patient will have improved  shoulder strength to 4/5 needed for carrying light household objects with min complaint of pain   Time 8   Period Weeks   Status New   PT LONG TERM GOAL #4   Title Patient and husband will report an overall 50% improvement in pain/function   Time 8   Period Weeks   Status New               Plan - 06/15/15 1200    Clinical Impression Statement Pt able to participate in activities with constant reminders for technique, pain with AAROM into flexion Rt UE. Pt will continue to benfit from skilled PT.    Pt will benefit from skilled therapeutic intervention in order to improve on the following deficits Decreased activity tolerance;Decreased range of motion;Decreased strength;Pain;Impaired UE functional use   Rehab Potential Good   Clinical Impairments Affecting Rehab Potential Alzheimers; osteoporosis, history of Rt shoulder luxation   PT Frequency 2x / week   PT Duration 8 weeks   PT Treatment/Interventions ADLs/Self Care Home Management;Cryotherapy;Electrical Stimulation;Moist Heat;Iontophoresis /ml Dexamethasone;Ultrasound;Therapeutic exercise;Patient/family education;Taping;Manual techniques   PT Next Visit Plan ionto if cert signed;  review isometrics; shoulder AROM/AAROM as tolerated;  e-stim/moist heat/ice   PT Home Exercise Plan added flexion in standing, supine cane flexion; shoulder isometrics   Consulted and Agree with Plan of Care Patient        Problem List Patient Active Problem List   Diagnosis Date Noted  . Alzheimer's disease 12/31/2012  . Other vitamin B12 deficiency anemia 12/31/2012    NAUMANN-HOUEGNIFIO,Chasya Zenz  PTA  06/15/2015, 12:47 PM  [facility] [address] [place], [place], [address] Phone: [phone]   Fax:  [phone]  Name: [name] MRN: [id number] Date of Birth: 01/31/1941

## 2015-06-15 NOTE — Patient Instructions (Addendum)
Copyright  VHI. All rights reserved.  ROM: Flexion (Alternate)    Slide right arm up wall, with palm out, by leaning toward wall. Hold _5___ seconds. Repeat __10__ times per set. Do __2__ sets per session. Do _2-3___ sessions per day.  http://orth.exer.us/758   Copyright  VHI. All rights reserved.

## 2015-06-19 ENCOUNTER — Encounter: Payer: Self-pay | Admitting: Physical Therapy

## 2015-06-19 ENCOUNTER — Ambulatory Visit: Payer: Medicare Other | Admitting: Physical Therapy

## 2015-06-19 DIAGNOSIS — M25611 Stiffness of right shoulder, not elsewhere classified: Secondary | ICD-10-CM

## 2015-06-19 DIAGNOSIS — M6281 Muscle weakness (generalized): Secondary | ICD-10-CM

## 2015-06-19 DIAGNOSIS — M25511 Pain in right shoulder: Secondary | ICD-10-CM

## 2015-06-19 NOTE — Therapy (Addendum)
[facility] [address] [place], [place], [address] Phone: [phone]   Fax:  [phone]  Physical Therapy Treatment/Discharge Summary  Patient Details  Name: [name] MRN: [id number] Date of Birth: 09-Oct-1940 Referring Provider: Pricilla Holm PA (Dr. Tamera Punt)  Encounter Date: 06/19/2015      PT End of Session - 06/19/15 0856    Visit Number 3   Number of Visits 10   Date for PT Re-Evaluation 08/07/15   Authorization Type Medicare G codes; KX at 26   PT Start Time 1015   PT Stop Time 1100   PT Time Calculation (min) 45 min   Activity Tolerance Patient tolerated treatment well      Past Medical History  Diagnosis Date  . Hypothyroidism   . Dementia   . Depression   . Osteoporosis   . Hx of cervical spine surgery   . Vitamin B12 deficiency   . H/O tubal ligation   . Hyperthyroidism 06-2014    Past Surgical History  Procedure Laterality Date  . Breast surgery      reduction  . Finger arthroplasty  04/02/2012    Procedure: FINGER ARTHROPLASTY;  Surgeon: Rayvon Char. Grandville Silos, MD;  Location: [facility];  Service: Orthopedics;  Laterality: Left;  Left Thumb Suspension Arthroplasty   . Bunionectomies      There were no vitals filed for this visit.  Visit Diagnosis:  Right shoulder pain  Shoulder stiffness, right  Muscle weakness of right upper extremity      Subjective Assessment - 06/19/15 0846    Subjective Pain in Rt shoulder and limited motion, sometimes it does not hurt at all sometimes it is bad.    Patient is accompained by: Family member  husband   Pertinent History Alzheimer's ; osteoporosis;  hx of shoulder dislocation right 20 years ago; neck fusion 20 years ago   Diagnostic tests shoulder x-ray some OA   Patient Stated Goals not have pain   Currently in Pain? Yes   Pain Score 4    Pain Location Shoulder   Pain Orientation Right   Pain Descriptors / Indicators  --  it hurts pt unable to describe it   Pain Type Chronic pain   Pain Onset More than a month ago   Pain Frequency Intermittent   Aggravating Factors  no patterns on movement, can hurt even at resting position   Pain Relieving Factors rubbing it   Multiple Pain Sites No                         [facility] Adult PT Treatment/Exercise - 06/19/15 0001    Exercises   Exercises Shoulder   Shoulder Exercises: Seated   Flexion Strengthening;20 reps  with cane x20, to 90 degrees   Other Seated Exercises Overhead flexion x2x10 with cane   Shoulder Exercises: Standing   Flexion AAROM  red physioball up the wall 2x10   Shoulder Exercises: Pulleys   Flexion 3 minutes   ABduction 3 minutes   Shoulder Exercises: Isometric Strengthening   Flexion 3X5"   Extension 3X5"   External Rotation 3X5"   Internal Rotation 3X5"   ABduction 3X5"   Modalities   Modalities Ultrasound   Ultrasound   Ultrasound Location Lt ant shoulder   Ultrasound Parameters 50%, 1Mhz, 0.8W/cm, 23mn   Ultrasound Goals Pain   Manual Therapy   Manual Therapy Soft tissue mobilization   Manual therapy comments TP  Soft tissue mobilization STW to Rt deltoid, TP release on biceps                  PT Short Term Goals - 06/15/15 1207    PT SHORT TERM GOAL #1   Title The patient/husband will have basic self management strategies for pain control including heat/ice   07/10/15   Time 4   Period Weeks   Status On-going   PT SHORT TERM GOAL #2   Title Right shoulder flexion to 155 degrees needed for putting on a pullover shirt.   Time 4   Period Weeks   Status On-going   PT SHORT TERM GOAL #3   Title The patient/husband will report a 25% reduction in overall daily pain   Time 4   Period Weeks   Status On-going           PT Long Term Goals - 06/12/15 1225    PT LONG TERM GOAL #1   Title The patient and husband will be independent in HEP for ROM, strength and pain control strategies  08/07/15    Time 8   Period Weeks   Status New   PT LONG TERM GOAL #2   Title The patient will have improved shoulder flexion to 160 needed for reaching shoulder height shelves    Time 8   Period Weeks   Status New   PT LONG TERM GOAL #3   Title The patient will have improved shoulder strength to 4/5 needed for carrying light household objects with min complaint of pain   Time 8   Period Weeks   Status New   PT LONG TERM GOAL #4   Title Patient and husband will report an overall 50% improvement in pain/function   Time 8   Period Weeks   Status New               Plan - 06/19/15 5597    Clinical Impression Statement Pt with improved ROM after pully activities. Pt with sudden/short onset of discomfort with activities. Pt will continue to benefit from skilled PT to improve activity tolerance with less pain.    Pt will benefit from skilled therapeutic intervention in order to improve on the following deficits Decreased activity tolerance;Decreased range of motion;Decreased strength;Pain;Impaired UE functional use   Rehab Potential Good   Clinical Impairments Affecting Rehab Potential Alzheimers; osteoporosis, history of Rt shoulder luxation   PT Frequency 2x / week   PT Duration 8 weeks   PT Treatment/Interventions ADLs/Self Care Home Management;Cryotherapy;Electrical Stimulation;Moist Heat;Iontophoresis '4mg'$ /ml Dexamethasone;Ultrasound;Therapeutic exercise;Patient/family education;Taping;Manual techniques   PT Next Visit Plan ionto if cert signed;  review isometrics; shoulder AROM/AAROM as tolerated;  e-stim/moist heat/ice   PT Home Exercise Plan current HEP   Consulted and Agree with Plan of Care Patient     G code:  Current CJ, Goal CJ, Discharge CJ   PHYSICAL THERAPY DISCHARGE SUMMARY  Visits from Start of Care: 3  Current functional level related to goals / functional outcomes: The patient's husband called to hold PT secondary to getting injections from the doctor.  Called  patient and left message.  Has not called to resume.   Remaining deficits: No progress toward goals   Education / Equipment: Basic HEP  Plan: Patient agrees to discharge.  Patient goals were not met. Patient is being discharged due to the patient's request.  ?????       Problem List Patient Active Problem List   Diagnosis Date Noted  .  Alzheimer's disease 12/31/2012  . Other vitamin B12 deficiency anemia 12/31/2012  Ruben Im, PT 07/30/2015 7:47 AM Phone: [phone] Fax: [phone]  NAUMANN-HOUEGNIFIO,Gerldine Suleiman PTA 06/19/2015, 10:29 AM  [facility] [address] [place], [place], [address] Phone: [phone]   Fax:  [phone]  Name: [name] MRN: [id number] Date of Birth: 1941/01/23

## 2015-06-22 ENCOUNTER — Ambulatory Visit: Payer: Medicare Other | Admitting: Physical Therapy

## 2015-06-24 ENCOUNTER — Encounter: Payer: Medicare Other | Admitting: Physical Therapy

## 2015-06-30 ENCOUNTER — Other Ambulatory Visit: Payer: Self-pay | Admitting: Obstetrics & Gynecology

## 2015-06-30 DIAGNOSIS — M79621 Pain in right upper arm: Secondary | ICD-10-CM

## 2015-07-06 ENCOUNTER — Ambulatory Visit
Admission: RE | Admit: 2015-07-06 | Discharge: 2015-07-06 | Disposition: A | Discharge status: Home / Self Care | Payer: Medicare Other | Source: Ambulatory Visit | Attending: Obstetrics & Gynecology | Admitting: Obstetrics & Gynecology

## 2015-07-06 DIAGNOSIS — M79621 Pain in right upper arm: Secondary | ICD-10-CM

## 2015-07-13 ENCOUNTER — Other Ambulatory Visit: Payer: Self-pay | Admitting: Orthopedic Surgery

## 2015-07-13 DIAGNOSIS — M25511 Pain in right shoulder: Secondary | ICD-10-CM

## 2015-07-20 ENCOUNTER — Ambulatory Visit
Admission: RE | Admit: 2015-07-20 | Discharge: 2015-07-20 | Disposition: A | Discharge status: Home / Self Care | Payer: Medicare Other | Source: Ambulatory Visit | Attending: Orthopedic Surgery | Admitting: Orthopedic Surgery

## 2015-07-20 DIAGNOSIS — M25511 Pain in right shoulder: Secondary | ICD-10-CM

## 2015-07-28 ENCOUNTER — Telehealth: Payer: Self-pay | Admitting: Physical Therapy

## 2015-07-28 NOTE — Telephone Encounter (Signed)
Called and left message regarding continuation vs. Discharge from PT

## 2015-09-11 ENCOUNTER — Telehealth: Payer: Self-pay | Admitting: Neurology

## 2015-09-11 MED ORDER — MEMANTINE HCL ER 28 MG PO CP24: 28.0000 mg | ORAL_CAPSULE | Freq: Every day | ORAL | Status: DC

## 2015-09-11 NOTE — Telephone Encounter (Signed)
Pt's husband called requesting refill formemantine (NAMENDA XR) 28 MG CP24 24 hr capsule 90 day supply  to [facility] on [address]. Pt will out of medication on Monday or Tuesday

## 2015-09-11 NOTE — Telephone Encounter (Signed)
Pharmacy had only 60 day refill available rather than 90 day. New 90 day rx retailed per pt/husband's request.

## 2015-11-04 ENCOUNTER — Encounter: Payer: Self-pay | Admitting: Nurse Practitioner

## 2015-11-04 ENCOUNTER — Ambulatory Visit (INDEPENDENT_AMBULATORY_CARE_PROVIDER_SITE_OTHER): Payer: Medicare Other | Admitting: Nurse Practitioner

## 2015-11-04 VITALS — BP 108/71 | HR 65 | Ht 64.0 in | Wt 187.8 lb

## 2015-11-04 DIAGNOSIS — D518 Other vitamin B12 deficiency anemias: Secondary | ICD-10-CM | POA: Diagnosis not present

## 2015-11-04 DIAGNOSIS — G309 Alzheimer's disease, unspecified: Secondary | ICD-10-CM

## 2015-11-04 DIAGNOSIS — F0280 Dementia in other diseases classified elsewhere without behavioral disturbance: Secondary | ICD-10-CM | POA: Diagnosis not present

## 2015-11-04 MED ORDER — DONEPEZIL HCL 10 MG PO TABS: 10.0000 mg | ORAL_TABLET | Freq: Every day | ORAL | Status: DC

## 2015-11-04 NOTE — Patient Instructions (Signed)
Continue Aricept at current dose  Continue Namenda at current dose XR 28mg  Ask pharmacist about your cost for combination drug Namzaric Exercise memory, continue exercise at [facility] program Follow-up in 6 months, next with Dr. Anne HahnWillis

## 2015-11-04 NOTE — Progress Notes (Signed)
I agree with the assessment and plan as directed by NP .   Dempsey Knotek, MD  

## 2015-11-04 NOTE — Progress Notes (Signed)
[facility]  PATIENT: [name] DOB: 03/25/1941   REASON FOR VISIT: Follow-up for Alzheimer's dementia , B12 deficiency HISTORY FROM: Patient and husband    HISTORY OF PRESENT ILLNESS:[name] is a 75 year old right-handed white female with a history of a progressive memory disorder. She was last seen in this office 05/06/15.  The patient has had ongoing issues with memory. Husband claims her memory issues are stable. The patient has been on Aricept and Namenda. The patient has had some diarrhea initially on Aricept, but this is no longer a problem.. The husband indicates that in the past she was unable to tolerate Axona. The patient is not operating motor vehicle at this time. The patient needs assistance with keeping up with her medications and her appointments. She can feed herself and dress herself after clothes have been laid out for her. No significant changes in her day to day activities have occurred since last seen. She goes to the [employer] program 3 days a week for 8 hours so that her husband has some respite. She has excellent appetite and sleeps well without any wandering behavior Husband is asking about taking Namenda  po bid since cost of Namenda  is about $500.mo She returns for reevaluation .   REVIEW OF SYSTEMS: Full 14 system review of systems performed and notable only for those listed, all others are neg:  Constitutional: neg  Cardiovascular: neg Ear/Nose/Throat: neg  Skin: neg Eyes: neg Respiratory: neg Gastroitestinal: neg  Hematology/Lymphatic: neg  Endocrine: Intolerance to cold Musculoskeletal: Joint pain Allergy/Immunology: neg Neurological: Memory loss Psychiatric: neg Sleep : neg   ALLERGIES: Allergies  Allergen Reactions  . Asa [Aspirin] Nausea And Vomiting    Bleeding ulcers    HOME MEDICATIONS: Outpatient Prescriptions Prior to Visit  Medication Sig Dispense Refill  . alendronate (FOSAMAX) 10 MG tablet Take  10 mg by mouth daily before breakfast. Take with a full glass of water on an empty stomach.    . calcium carbonate (OS-CAL - DOSED IN MG OF ELEMENTAL CALCIUM) 1250 (500 CA) MG tablet Take 1 tablet by mouth. With 1000 units vit D.    . citalopram (CELEXA) 20 MG tablet Take 20 mg by mouth daily.    . cyanocobalamin 1000 MCG tablet Take 1,000 mcg by mouth daily.    Marland Kitchen donepezil (ARICEPT) 10 MG tablet Take 1 tablet (10 mg total) by mouth daily. 90 tablet 3  . memantine (NAMENDA XR) 28 MG CP24 24 hr capsule Take 1 capsule (28 mg total) by mouth daily. 90 capsule 3  . methimazole (TAPAZOLE) 5 MG tablet 5 mg. Twice weekly    . famotidine (PEPCID) 20 MG tablet Take 20 mg by mouth daily. Reported on 11/04/2015    . promethazine (PHENERGAN) 25 MG tablet Take 25 mg by mouth every 6 (six) hours as needed. Reported on 11/04/2015  0  . rosuvastatin (CRESTOR) 10 MG tablet Reported on 11/04/2015     No facility-administered medications prior to visit.    PAST MEDICAL HISTORY: Past Medical History  Diagnosis Date  . Hypothyroidism   . Dementia   . Depression   . Osteoporosis   . Hx of cervical spine surgery   . Vitamin B12 deficiency   . H/O tubal ligation   . Hyperthyroidism 06-2014    PAST SURGICAL HISTORY: Past Surgical History  Procedure Laterality Date  . Breast surgery      reduction  . Finger arthroplasty  04/02/2012    Procedure: FINGER ARTHROPLASTY;  Surgeon: Cliffton Astersavid A. Janee Mornhompson, MD;  [facility];  Service: Orthopedics;  Laterality: Left;  Left Thumb Suspension Arthroplasty   . Bunionectomies      FAMILY HISTORY: Family History  Problem Relation Age of Onset  . Cancer Mother     mesothelioma  . Cancer Father     mesothelioma  . Dementia Paternal Grandmother     SOCIAL HISTORY: Social History   Social History  . Marital Status: Married    Spouse Name: N/A  . Number of Children: 2  . Years of Education: college   Occupational History  . Retired     Social History Main Topics  . Smoking status: Former Smoker    Types: Cigarettes  . Smokeless tobacco: Never Used  . Alcohol Use: No  . Drug Use: No  . Sexual Activity: Not on file   Other Topics Concern  . Not on file   Social History Narrative     PHYSICAL EXAM  Filed Vitals:   11/04/15 0839  BP: 108/71  Pulse: 65  Height: 5\' 4"  (1.626 m)  Weight: 187 lb 12.8 oz (85.186 kg)   Body mass index is 32.22 kg/(m^2). General: The patient is alert and cooperative at the time of the examination. Skin: No significant peripheral edema is noted. Neurologic Exam Mental status: The Mini-Mental status examination done today shows a total score of 9/30. Last was 9/30. AFT 4. Unable to draw a clock. Cranial nerves: Facial symmetry is present. Speech is normal, no aphasia or dysarthria but word finding difficulty is noted Extraocular movements are full. Visual fields are full. Motor: The patient has good strength in all 4 extremities. No focal weakness Sensory examination: Soft touch sensation is symmetric on the face, arms, and legs. 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 Gait and station: The patient has a normal gait. Tandem gait is mildly unsteady . Romberg is negative. No drift is seen. Reflexes: Deep tendon reflexes are symmetric.  DIAGNOSTIC DATA (LABS, IMAGING, TESTING)  ASSESSMENT AND PLAN 75 y.o. year old female has a past medical history of Dementia; Depression; Osteoporosis; cervical spine surgery; Vitamin B12 deficiency; here to follow-up.The patient is a current patient of Dr. Anne HahnWillis   who is out of the office today . This note is sent to the work in doctor.     Continue Aricept at current dose  Continue Namenda at current dose XR 28mg  she will continue this for now Ask pharmacist about your cost for combination drug Namzaric Exercise memory, continue exercise at [facility] program 3 times weekly Follow-up in 6  months, next with Dr. Woody SellerWillis Clovis [place] Carolyn Klark Vanderhoef, [facility] PavilionGNP, [facility] CenterBC, APRN  [facility] [address], [address] [place], [place] [address] [phone]

## 2015-11-06 ENCOUNTER — Telehealth: Payer: Self-pay | Admitting: Nurse Practitioner

## 2015-11-06 MED ORDER — MEMANTINE HCL-DONEPEZIL HCL ER 28-10 MG PO CP24: 1.0000 | ORAL_CAPSULE | Freq: Every day | ORAL | Status: DC

## 2015-11-06 NOTE — Telephone Encounter (Addendum)
Spouse called to advise, they were informed by insurance company and pharmacy that [employer] is cheaper than memantine (NAMENDA XR) 28 MG CP24 24 hr capsule and donepezil (ARICEPT) 10 MG tablet. Spouse also advises, [facility] will [facility] PA and requests 90 day supply.

## 2015-11-06 NOTE — Telephone Encounter (Signed)
RX sent

## 2015-11-11 NOTE — Addendum Note (Signed)
Addended byHermenia Fiscal: Jeffrey Voth on: 11/11/2015 12:03 PM   Modules accepted: Medications

## 2015-11-11 NOTE — Telephone Encounter (Signed)
PA initiated for namzaric.  Last ofv note faxed as well to cover my meds.  Received this am, note from [employer] that clinical prior authorization not required on this drug.  ID # [id number]

## 2016-02-16 ENCOUNTER — Other Ambulatory Visit: Payer: Self-pay | Admitting: Orthopedic Surgery

## 2016-02-16 DIAGNOSIS — M545 Low back pain: Secondary | ICD-10-CM

## 2016-02-22 DIAGNOSIS — G309 Alzheimer's disease, unspecified: Secondary | ICD-10-CM | POA: Diagnosis not present

## 2016-02-22 DIAGNOSIS — M5134 Other intervertebral disc degeneration, thoracic region: Secondary | ICD-10-CM | POA: Insufficient documentation

## 2016-02-22 DIAGNOSIS — Z87891 Personal history of nicotine dependence: Secondary | ICD-10-CM | POA: Diagnosis not present

## 2016-02-22 DIAGNOSIS — M549 Dorsalgia, unspecified: Secondary | ICD-10-CM | POA: Diagnosis present

## 2016-02-22 DIAGNOSIS — E039 Hypothyroidism, unspecified: Secondary | ICD-10-CM | POA: Insufficient documentation

## 2016-02-22 DIAGNOSIS — Z79899 Other long term (current) drug therapy: Secondary | ICD-10-CM | POA: Diagnosis not present

## 2016-02-22 NOTE — ED Triage Notes (Signed)
Pt has had back pain that has been going on for a while, pt has schedule for MRI on Thursday of the back, today back pain became severe and sharp, also making it hard for her to breath. Pt unable to lay down.

## 2016-02-23 ENCOUNTER — Emergency Department (HOSPITAL_COMMUNITY)
Admission: EM | Admit: 2016-02-23 | Discharge: 2016-02-23 | Disposition: A | Discharge status: Home / Self Care | Payer: Medicare Other | Attending: Emergency Medicine | Admitting: Emergency Medicine

## 2016-02-23 ENCOUNTER — Emergency Department (HOSPITAL_COMMUNITY): Payer: Medicare Other

## 2016-02-23 ENCOUNTER — Encounter (HOSPITAL_COMMUNITY): Payer: Self-pay

## 2016-02-23 DIAGNOSIS — M5134 Other intervertebral disc degeneration, thoracic region: Secondary | ICD-10-CM

## 2016-02-23 LAB — I-STAT TROPONIN, ED: Troponin i, poc: 0.01 ng/mL (ref 0.00–0.08)

## 2016-02-23 MED ORDER — OXYCODONE-ACETAMINOPHEN 5-325 MG PO TABS
1.0000 | ORAL_TABLET | Freq: Once | ORAL | Status: AC
  Administered 2016-02-23: 1 via ORAL
  Filled 2016-02-23: qty 1

## 2016-02-23 MED ORDER — METHOCARBAMOL 500 MG PO TABS
500.0000 mg | ORAL_TABLET | Freq: Two times a day (BID) | ORAL | 0 refills | Status: DC
Start: 2016-02-23 — End: 2016-03-31

## 2016-02-23 MED ORDER — DIAZEPAM 5 MG PO TABS
5.0000 mg | ORAL_TABLET | Freq: Once | ORAL | Status: AC
  Administered 2016-02-23: 5 mg via ORAL
  Filled 2016-02-23: qty 1

## 2016-02-23 NOTE — ED Notes (Signed)
Patient transported to X-ray 

## 2016-02-23 NOTE — ED Provider Notes (Signed)
[facility]-EMERGENCY DEPT Provider Note   CSN: [id number] Arrival date & time: 02/22/16  2349     History   Chief Complaint Chief Complaint  Patient presents with  . Back Pain  . Shortness of Breath    HPI [name] is a 75 y.o. female.  HPI Pt comes in with cc of back pain. Pt has hx of DJD of the back and is supposed to get MRI on Thursday. Pt reports that she had severe pain that woke her up from her sleep. There is no pain in the front of the chest. Pt denies dib, nausea, diophoresis.    ROS 10 Systems reviewed and are negative for acute change except as noted in the HPI.      Past Medical History:  Diagnosis Date  . Dementia   . Depression   . H/O tubal ligation   . Hx of cervical spine surgery   . Hyperthyroidism 06-2014  . Hypothyroidism   . Osteoporosis   . Vitamin B12 deficiency     Patient Active Problem List   Diagnosis Date Noted  . Alzheimer's disease 12/31/2012  . Other vitamin B12 deficiency anemia 12/31/2012    Past Surgical History:  Procedure Laterality Date  . BREAST SURGERY     reduction  . bunionectomies    . FINGER ARTHROPLASTY  04/02/2012   Procedure: FINGER ARTHROPLASTY;  Surgeon: Cliffton Astersavid A. Janee Mornhompson, MD;  [facility];  Service: Orthopedics;  Laterality: Left;  Left Thumb Suspension Arthroplasty     OB History    No data available       Home Medications    Prior to Admission medications   Medication Sig Start Date End Date Taking? Authorizing Provider  alendronate (FOSAMAX) 10 MG tablet Take 10 mg by mouth daily before breakfast. Take with a full glass of water on an empty stomach.    Historical Provider, MD  calcium carbonate (OS-CAL - DOSED IN MG OF ELEMENTAL CALCIUM) 1250 (500 CA) MG tablet Take 1 tablet by mouth. With 1000 units vit D.    Historical Provider, MD  citalopram (CELEXA) 20 MG tablet Take 20 mg by mouth daily.    Historical Provider, MD  cyanocobalamin 1000 MCG tablet Take 1,000 mcg by  mouth daily.    Historical Provider, MD  Memantine HCl-Donepezil HCl (NAMZARIC) 28-10 MG CP24 Take 1 capsule by mouth daily. 11/06/15   Nilda RiggsNancy Carolyn Martin, NP  methimazole (TAPAZOLE) 5 MG tablet 5 mg. Twice weekly 04/20/15   Historical Provider, MD  methocarbamol (ROBAXIN) 500 MG tablet Take 1 tablet (500 mg total) by mouth 2 (two) times daily. 02/23/16   Derwood KaplanAnkit Davinci Glotfelty, MD    Family History Family History  Problem Relation Age of Onset  . Cancer Mother     mesothelioma  . Cancer Father     mesothelioma  . Dementia Paternal Grandmother     Social History Social History  Substance Use Topics  . Smoking status: Former Smoker    Types: Cigarettes  . Smokeless tobacco: Never Used  . Alcohol use No     Allergies   Asa [aspirin]   Review of Systems Review of Systems   Physical Exam Updated Vital Signs BP 125/61   Pulse (!) 59   Temp 97.4 F (36.3 C) (Oral)   Resp 18   SpO2 95%   Physical Exam  Constitutional: She is oriented to person, place, and time. She appears well-developed and well-nourished.  HENT:  Head: Normocephalic and atraumatic.  Eyes: EOM are normal. Pupils are equal, round, and reactive to light.  Neck: Neck supple.  Cardiovascular: Normal rate, regular rhythm and normal heart sounds.   No murmur heard. Pulmonary/Chest: Effort normal. No respiratory distress.  Abdominal: Soft. She exhibits no distension. There is no tenderness. There is no rebound and no guarding.  Musculoskeletal:  Pt has tenderness over the thoracic spine diffusely No step offs, no erythema. Pt has 2+ patellar reflex bilaterally. Able to discriminate between sharp and dull. Able to ambulate Motor and sensory exam are grossly normal  Pt has paraspinal tenderness, and also tenderness over the ribs.  Neurological: She is alert and oriented to person, place, and time.  Skin: Skin is warm and dry.  Nursing note and vitals reviewed.    ED Treatments / Results  Labs (all labs  ordered are listed, but only abnormal results are displayed) Labs Reviewed  Rosezena Sensor, ED    EKG  EKG Interpretation  Date/Time:  Tuesday February 23 2016 54:09:81 EDT Ventricular Rate:  61 PR Interval:  148 QRS Duration: 80 QT Interval:  424 QTC Calculation: 426 R Axis:   78 Text Interpretation:  Normal sinus rhythm Normal ECG No acute changes Confirmed by Rhunette Croft, MD, Janey Genta [phone]) on 02/23/2016 4:42:44 AM       Radiology Dg Chest 2 View  Result Date: 02/23/2016 CLINICAL DATA:  75 year old female with shortness of breath EXAM: CHEST  2 VIEW COMPARISON:  Chest radiograph dated 02/16/2011 FINDINGS: The lungs are clear. There is no pleural effusion or pneumothorax. The cardiac silhouette is within normal limits. Lower cervical fixation hardware. Osteopenia with degenerative changes of the spine. No acute fracture. IMPRESSION: No active cardiopulmonary disease. Electronically Signed   By: Elgie Collard M.D.   On: 02/23/2016 00:57   Dg Thoracic Spine 2 View  Result Date: 02/23/2016 CLINICAL DATA:  Mid back pain for months. Worse today. No known injury EXAM: THORACIC SPINE 2 VIEWS COMPARISON:  Two-view chest 02/23/2016 FINDINGS: Mild thoracic curvature convex towards the left. Postoperative changes in the cervical spine. No anterior subluxation. Degenerative changes throughout the thoracic spine with narrowed interspaces and endplate hypertrophic changes. Mild anterior wedge deformities of mid thoracic vertebra, unchanged since previous study. No focal bone lesion or bone destruction. No paraspinal soft tissue swelling. IMPRESSION: Degenerative changes in the thoracic spine. Chronic wedge deformities in mid thoracic vertebrae. No acute displaced fractures identified. Electronically Signed   By: Burman Nieves M.D.   On: 02/23/2016 05:40    Procedures Procedures (including critical care time)  Medications Ordered in ED Medications  oxyCODONE-acetaminophen (PERCOCET/ROXICET)  5-325 MG per tablet 1 tablet (1 tablet Oral Given 02/23/16 0538)  diazepam (VALIUM) tablet 5 mg (5 mg Oral Given 02/23/16 0538)     Initial Impression / Assessment and Plan / ED Course  I have reviewed the triage vital signs and the nursing notes.  Pertinent labs & imaging results that were available during my care of the patient were reviewed by me and considered in my medical decision making (see chart for details).  Clinical Course    Pt has focal spine tenderness. EKG is neg. Trop is neg. Xrays ordered - and show DJD. Will d/c. No red flags indicating cord compression.  Final Clinical Impressions(s) / ED Diagnoses   Final diagnoses:  Degenerative disc disease, thoracic    New Prescriptions New Prescriptions   METHOCARBAMOL (ROBAXIN) 500 MG TABLET    Take 1 tablet (500 mg total) by mouth 2 (two) times daily.  Derwood Kaplan, MD 02/23/16 [phone]

## 2016-02-25 ENCOUNTER — Ambulatory Visit
Admission: RE | Admit: 2016-02-25 | Discharge: 2016-02-25 | Disposition: A | Discharge status: Home / Self Care | Payer: Medicare Other | Source: Ambulatory Visit | Attending: Orthopedic Surgery | Admitting: Orthopedic Surgery

## 2016-02-25 DIAGNOSIS — M545 Low back pain, unspecified: Secondary | ICD-10-CM

## 2016-02-27 ENCOUNTER — Other Ambulatory Visit: Payer: Medicare Other

## 2016-03-31 ENCOUNTER — Ambulatory Visit
Admission: RE | Admit: 2016-03-31 | Discharge: 2016-03-31 | Disposition: A | Discharge status: Home / Self Care | Payer: Medicare Other | Source: Ambulatory Visit | Attending: Orthopedic Surgery | Admitting: Orthopedic Surgery

## 2016-03-31 ENCOUNTER — Other Ambulatory Visit: Payer: Self-pay | Admitting: Orthopedic Surgery

## 2016-03-31 DIAGNOSIS — M546 Pain in thoracic spine: Principal | ICD-10-CM

## 2016-03-31 DIAGNOSIS — G8929 Other chronic pain: Secondary | ICD-10-CM

## 2016-03-31 DIAGNOSIS — M545 Low back pain, unspecified: Secondary | ICD-10-CM

## 2016-04-06 ENCOUNTER — Encounter (HOSPITAL_COMMUNITY): Payer: Self-pay | Admitting: *Deleted

## 2016-04-06 ENCOUNTER — Other Ambulatory Visit: Payer: Self-pay | Admitting: Orthopedic Surgery

## 2016-04-07 ENCOUNTER — Encounter (HOSPITAL_COMMUNITY)
Admission: RE | Disposition: A | Discharge status: Home / Self Care | Payer: Self-pay | Source: Ambulatory Visit | Attending: Orthopedic Surgery

## 2016-04-07 ENCOUNTER — Ambulatory Visit (HOSPITAL_COMMUNITY)
Admission: RE | Admit: 2016-04-07 | Discharge: 2016-04-07 | Disposition: A | Discharge status: Home / Self Care | Payer: Medicare Other | Source: Ambulatory Visit | Attending: Orthopedic Surgery | Admitting: Orthopedic Surgery

## 2016-04-07 ENCOUNTER — Ambulatory Visit (HOSPITAL_COMMUNITY): Payer: Medicare Other

## 2016-04-07 ENCOUNTER — Ambulatory Visit (HOSPITAL_COMMUNITY): Payer: Medicare Other | Admitting: Anesthesiology

## 2016-04-07 ENCOUNTER — Encounter (HOSPITAL_COMMUNITY): Payer: Self-pay | Admitting: Urology

## 2016-04-07 DIAGNOSIS — F0280 Dementia in other diseases classified elsewhere without behavioral disturbance: Secondary | ICD-10-CM | POA: Insufficient documentation

## 2016-04-07 DIAGNOSIS — Z886 Allergy status to analgesic agent status: Secondary | ICD-10-CM | POA: Insufficient documentation

## 2016-04-07 DIAGNOSIS — I252 Old myocardial infarction: Secondary | ICD-10-CM | POA: Diagnosis not present

## 2016-04-07 DIAGNOSIS — G309 Alzheimer's disease, unspecified: Secondary | ICD-10-CM | POA: Diagnosis not present

## 2016-04-07 DIAGNOSIS — M4854XA Collapsed vertebra, not elsewhere classified, thoracic region, initial encounter for fracture: Secondary | ICD-10-CM | POA: Diagnosis not present

## 2016-04-07 DIAGNOSIS — E039 Hypothyroidism, unspecified: Secondary | ICD-10-CM | POA: Diagnosis not present

## 2016-04-07 DIAGNOSIS — S22060A Wedge compression fracture of T7-T8 vertebra, initial encounter for closed fracture: Secondary | ICD-10-CM

## 2016-04-07 DIAGNOSIS — E0590 Thyrotoxicosis, unspecified without thyrotoxic crisis or storm: Secondary | ICD-10-CM | POA: Diagnosis not present

## 2016-04-07 DIAGNOSIS — M810 Age-related osteoporosis without current pathological fracture: Secondary | ICD-10-CM | POA: Diagnosis not present

## 2016-04-07 DIAGNOSIS — F329 Major depressive disorder, single episode, unspecified: Secondary | ICD-10-CM | POA: Insufficient documentation

## 2016-04-07 DIAGNOSIS — Z87891 Personal history of nicotine dependence: Secondary | ICD-10-CM | POA: Insufficient documentation

## 2016-04-07 DIAGNOSIS — M1990 Unspecified osteoarthritis, unspecified site: Secondary | ICD-10-CM | POA: Diagnosis not present

## 2016-04-07 DIAGNOSIS — E538 Deficiency of other specified B group vitamins: Secondary | ICD-10-CM | POA: Insufficient documentation

## 2016-04-07 HISTORY — PX: KYPHOPLASTY: SHX5884

## 2016-04-07 HISTORY — DX: Acute myocardial infarction, unspecified: I21.9

## 2016-04-07 HISTORY — DX: Unspecified osteoarthritis, unspecified site: M19.90

## 2016-04-07 LAB — COMPREHENSIVE METABOLIC PANEL
ALBUMIN: 3.6 g/dL (ref 3.5–5.0)
ALK PHOS: 102 U/L (ref 38–126)
ALT: 13 U/L — AB (ref 14–54)
AST: 18 U/L (ref 15–41)
Anion gap: 7 (ref 5–15)
BILIRUBIN TOTAL: 0.6 mg/dL (ref 0.3–1.2)
BUN: 14 mg/dL (ref 6–20)
CALCIUM: 8.8 mg/dL — AB (ref 8.9–10.3)
CO2: 24 mmol/L (ref 22–32)
CREATININE: 0.66 mg/dL (ref 0.44–1.00)
Chloride: 107 mmol/L (ref 101–111)
GFR calc Af Amer: >60 mL/min (ref >60)
GLUCOSE: 99 mg/dL (ref 65–99)
POTASSIUM: 4.4 mmol/L (ref 3.5–5.1)
Sodium: 138 mmol/L (ref 135–145)
TOTAL PROTEIN: 6.1 g/dL — AB (ref 6.5–8.1)

## 2016-04-07 LAB — CBC WITH DIFFERENTIAL/PLATELET
BASOS ABS: 0 10*3/uL (ref 0.0–0.1)
Basophils Relative: 0 %
Eosinophils Absolute: 0.1 10*3/uL (ref 0.0–0.7)
Eosinophils Relative: 2 %
HEMATOCRIT: 40.4 % (ref 36.0–46.0)
HEMOGLOBIN: 12.8 g/dL (ref 12.0–15.0)
LYMPHS PCT: 17 %
Lymphs Abs: 1.2 10*3/uL (ref 0.7–4.0)
MCH: 27.5 pg (ref 26.0–34.0)
MCHC: 31.7 g/dL (ref 30.0–36.0)
MCV: 86.9 fL (ref 78.0–100.0)
MONO ABS: 0.4 10*3/uL (ref 0.1–1.0)
Monocytes Relative: 6 %
NEUTROS ABS: 5.6 10*3/uL (ref 1.7–7.7)
NEUTROS PCT: 75 %
Platelets: 217 10*3/uL (ref 150–400)
RBC: 4.65 MIL/uL (ref 3.87–5.11)
RDW: 13.7 % (ref 11.5–15.5)
WBC: 7.4 10*3/uL (ref 4.0–10.5)

## 2016-04-07 LAB — URINALYSIS, ROUTINE W REFLEX MICROSCOPIC
BILIRUBIN URINE: NEGATIVE
GLUCOSE, UA: NEGATIVE mg/dL
HGB URINE DIPSTICK: NEGATIVE
KETONES UR: NEGATIVE mg/dL
Leukocytes, UA: NEGATIVE
Nitrite: NEGATIVE
PH: 6.5 (ref 5.0–8.0)
Protein, ur: NEGATIVE mg/dL
SPECIFIC GRAVITY, URINE: 1.018 (ref 1.005–1.030)

## 2016-04-07 LAB — PROTIME-INR
INR: 1.08
PROTHROMBIN TIME: 14.1 s (ref 11.4–15.2)

## 2016-04-07 LAB — APTT: APTT: 33 s (ref 24–36)

## 2016-04-07 SURGERY — KYPHOPLASTY
Anesthesia: General

## 2016-04-07 MED ORDER — IOPAMIDOL (ISOVUE-300) INJECTION 61%
INTRAVENOUS | Status: DC | PRN
  Administered 2016-04-07: 100 mL

## 2016-04-07 MED ORDER — IOPAMIDOL (ISOVUE-300) INJECTION 61%
INTRAVENOUS | Status: AC
  Filled 2016-04-07: qty 50

## 2016-04-07 MED ORDER — EPHEDRINE 5 MG/ML INJ
INTRAVENOUS | Status: AC
  Filled 2016-04-07: qty 10

## 2016-04-07 MED ORDER — LIDOCAINE HCL (CARDIAC) 20 MG/ML IV SOLN
INTRAVENOUS | Status: DC | PRN
  Administered 2016-04-07: 100 mg via INTRAVENOUS

## 2016-04-07 MED ORDER — BUPIVACAINE HCL (PF) 0.25 % IJ SOLN
INTRAMUSCULAR | Status: AC
  Filled 2016-04-07: qty 30

## 2016-04-07 MED ORDER — LACTATED RINGERS IV SOLN
INTRAVENOUS | Status: DC
  Administered 2016-04-07 (×2): via INTRAVENOUS

## 2016-04-07 MED ORDER — FENTANYL CITRATE (PF) 100 MCG/2ML IJ SOLN
INTRAMUSCULAR | Status: AC
  Filled 2016-04-07: qty 2

## 2016-04-07 MED ORDER — PROPOFOL 10 MG/ML IV BOLUS
INTRAVENOUS | Status: DC | PRN
  Administered 2016-04-07: 20 mg via INTRAVENOUS
  Administered 2016-04-07: 130 mg via INTRAVENOUS
  Administered 2016-04-07: 30 mg via INTRAVENOUS

## 2016-04-07 MED ORDER — CEFAZOLIN SODIUM-DEXTROSE 2-4 GM/100ML-% IV SOLN
INTRAVENOUS | Status: AC
  Filled 2016-04-07: qty 100

## 2016-04-07 MED ORDER — SUGAMMADEX SODIUM 200 MG/2ML IV SOLN
INTRAVENOUS | Status: AC
  Filled 2016-04-07: qty 2

## 2016-04-07 MED ORDER — ROCURONIUM BROMIDE 100 MG/10ML IV SOLN
INTRAVENOUS | Status: DC | PRN
  Administered 2016-04-07: 5 mg via INTRAVENOUS
  Administered 2016-04-07: 50 mg via INTRAVENOUS

## 2016-04-07 MED ORDER — ARTIFICIAL TEARS OP OINT
TOPICAL_OINTMENT | OPHTHALMIC | Status: AC
  Filled 2016-04-07: qty 3.5

## 2016-04-07 MED ORDER — ONDANSETRON HCL 4 MG/2ML IJ SOLN
INTRAMUSCULAR | Status: DC | PRN
  Administered 2016-04-07: 4 mg via INTRAVENOUS

## 2016-04-07 MED ORDER — CEFAZOLIN SODIUM-DEXTROSE 2-4 GM/100ML-% IV SOLN
2.0000 g | INTRAVENOUS | Status: AC
  Administered 2016-04-07: 2 g via INTRAVENOUS

## 2016-04-07 MED ORDER — BACITRACIN ZINC 500 UNIT/GM EX OINT
TOPICAL_OINTMENT | CUTANEOUS | Status: AC
  Filled 2016-04-07: qty 28.35

## 2016-04-07 MED ORDER — FENTANYL CITRATE (PF) 100 MCG/2ML IJ SOLN
INTRAMUSCULAR | Status: DC | PRN
  Administered 2016-04-07: 50 ug via INTRAVENOUS
  Administered 2016-04-07: 100 ug via INTRAVENOUS

## 2016-04-07 MED ORDER — SUGAMMADEX SODIUM 200 MG/2ML IV SOLN
INTRAVENOUS | Status: DC | PRN
  Administered 2016-04-07: 160 mg via INTRAVENOUS

## 2016-04-07 MED ORDER — POVIDONE-IODINE 7.5 % EX SOLN
Freq: Once | CUTANEOUS | Status: DC
  Filled 2016-04-07: qty 118

## 2016-04-07 MED ORDER — ROCURONIUM BROMIDE 10 MG/ML (PF) SYRINGE
PREFILLED_SYRINGE | INTRAVENOUS | Status: AC
  Filled 2016-04-07: qty 10

## 2016-04-07 MED ORDER — FENTANYL CITRATE (PF) 100 MCG/2ML IJ SOLN
25.0000 ug | INTRAMUSCULAR | Status: DC | PRN
  Administered 2016-04-07: 50 ug via INTRAVENOUS
  Administered 2016-04-07 (×2): 25 ug via INTRAVENOUS

## 2016-04-07 MED ORDER — ARTIFICIAL TEARS OP OINT
TOPICAL_OINTMENT | OPHTHALMIC | Status: DC | PRN
  Administered 2016-04-07: 1 via OPHTHALMIC

## 2016-04-07 MED ORDER — EPHEDRINE SULFATE 50 MG/ML IJ SOLN
INTRAMUSCULAR | Status: DC | PRN
  Administered 2016-04-07: 10 mg via INTRAVENOUS

## 2016-04-07 MED ORDER — ONDANSETRON HCL 4 MG/2ML IJ SOLN
INTRAMUSCULAR | Status: AC
  Filled 2016-04-07: qty 2

## 2016-04-07 MED ORDER — LIDOCAINE 2% (20 MG/ML) 5 ML SYRINGE
INTRAMUSCULAR | Status: AC
  Filled 2016-04-07: qty 5

## 2016-04-07 MED ORDER — PROPOFOL 10 MG/ML IV BOLUS
INTRAVENOUS | Status: AC
  Filled 2016-04-07: qty 20

## 2016-04-07 SURGICAL SUPPLY — 43 items
BLADE SURG 15 STRL LF DISP TIS (BLADE) ×1 IMPLANT
BLADE SURG 15 STRL SS (BLADE) ×2
CEMENT BONE KYPHX HV R (Orthopedic Implant) ×2 IMPLANT
COVER MAYO STAND STRL (DRAPES) ×2 IMPLANT
COVER SURGICAL LIGHT HANDLE (MISCELLANEOUS) ×1 IMPLANT
CURETTE EXPRESS SZ2 7MM (INSTRUMENTS) ×1 IMPLANT
CURRETTE EXPRESS SZ2 7MM (INSTRUMENTS) ×2
DRAPE C-ARM 42X72 X-RAY (DRAPES) ×2 IMPLANT
DRAPE INCISE IOBAN 66X45 STRL (DRAPES) ×3 IMPLANT
DRAPE LAPAROTOMY T 102X78X121 (DRAPES) ×2 IMPLANT
DRAPE PROXIMA HALF (DRAPES) ×1 IMPLANT
DRAPE SURG 17X23 STRL (DRAPES) ×8 IMPLANT
DURAPREP 26ML APPLICATOR (WOUND CARE) ×2 IMPLANT
GAUZE SPONGE 4X4 12PLY STRL (GAUZE/BANDAGES/DRESSINGS) ×1 IMPLANT
GAUZE SPONGE 4X4 16PLY XRAY LF (GAUZE/BANDAGES/DRESSINGS) ×2 IMPLANT
GLOVE BIO SURGEON STRL SZ7 (GLOVE) ×2 IMPLANT
GLOVE BIO SURGEON STRL SZ8 (GLOVE) ×2 IMPLANT
GLOVE BIOGEL PI IND STRL 7.0 (GLOVE) ×1 IMPLANT
GLOVE BIOGEL PI IND STRL 8 (GLOVE) ×1 IMPLANT
GLOVE BIOGEL PI INDICATOR 7.0 (GLOVE) ×1
GLOVE BIOGEL PI INDICATOR 8 (GLOVE) ×1
GOWN STRL REUS W/ TWL LRG LVL3 (GOWN DISPOSABLE) ×2 IMPLANT
GOWN STRL REUS W/ TWL XL LVL3 (GOWN DISPOSABLE) ×1 IMPLANT
GOWN STRL REUS W/TWL LRG LVL3 (GOWN DISPOSABLE) ×4
GOWN STRL REUS W/TWL XL LVL3 (GOWN DISPOSABLE) ×2
KIT BASIN OR (CUSTOM PROCEDURE TRAY) ×2 IMPLANT
KIT ROOM TURNOVER OR (KITS) ×2 IMPLANT
NDL HYPO 25X1 1.5 SAFETY (NEEDLE) IMPLANT
NDL SPNL 18GX3.5 QUINCKE PK (NEEDLE) ×2 IMPLANT
NEEDLE 22X1 1/2 (OR ONLY) (NEEDLE) IMPLANT
NEEDLE HYPO 25X1 1.5 SAFETY (NEEDLE) IMPLANT
NEEDLE SPNL 18GX3.5 QUINCKE PK (NEEDLE) ×4 IMPLANT
NS IRRIG 1000ML POUR BTL (IV SOLUTION) ×4 IMPLANT
PACK SURGICAL SETUP 50X90 (CUSTOM PROCEDURE TRAY) ×2 IMPLANT
PAD ARMBOARD 7.5X6 YLW CONV (MISCELLANEOUS) ×4 IMPLANT
POSITIONER HEAD PRONE TRACH (MISCELLANEOUS) ×2 IMPLANT
SUT MNCRL AB 4-0 PS2 18 (SUTURE) ×3 IMPLANT
SYR BULB IRRIGATION 50ML (SYRINGE) ×2 IMPLANT
SYR CONTROL 10ML LL (SYRINGE) ×2 IMPLANT
TOWEL OR 17X24 6PK STRL BLUE (TOWEL DISPOSABLE) ×2 IMPLANT
TOWEL OR 17X26 10 PK STRL BLUE (TOWEL DISPOSABLE) ×2 IMPLANT
TRAY KYPHOPAK 15/2 EXPRESS (KITS) ×4 IMPLANT
TRAY KYPHOPAK 15/3 ONESTEP 1ST (MISCELLANEOUS) IMPLANT

## 2016-04-07 NOTE — Anesthesia Postprocedure Evaluation (Signed)
Anesthesia Post Note  Patient: [name]  Procedure(s) Performed: Procedure(s) (LRB): THORACIC 7 KYPHOPLASTY (N/A)  Patient location during evaluation: PACU Anesthesia Type: General Level of consciousness: awake and alert Pain management: pain level controlled Vital Signs Assessment: post-procedure vital signs reviewed and stable Respiratory status: spontaneous breathing, nonlabored ventilation and respiratory function stable Cardiovascular status: blood pressure returned to baseline and stable Postop Assessment: no signs of nausea or vomiting Anesthetic complications: no    Last Vitals:  Vitals:   04/07/16 1625 04/07/16 1640  BP: 105/64 121/61  Pulse: 76 76  Resp: 20 19  Temp:      Last Pain:  Vitals:   04/07/16 0942  TempSrc: Oral                 Andree Heeg,W. EDMOND

## 2016-04-07 NOTE — Anesthesia Procedure Notes (Signed)
Procedure Name: Intubation Date/Time: 04/07/2016 1:40 PM Performed by: Edmonia CaprioAUSTON, Brandi Correia M Pre-anesthesia Checklist: Patient identified, Emergency Drugs available, Patient being monitored, Timeout performed and Suction available Patient Re-evaluated:Patient Re-evaluated prior to inductionOxygen Delivery Method: Circle system utilized Preoxygenation: Pre-oxygenation with 100% oxygen Intubation Type: IV induction Ventilation: Mask ventilation without difficulty Laryngoscope Size: Miller and 2 Grade View: Grade II Tube type: Oral Tube size: 7.0 mm Number of attempts: 1 Airway Equipment and Method: Stylet Placement Confirmation: ETT inserted through vocal cords under direct vision,  CO2 detector and breath sounds checked- equal and bilateral Secured at: 22 cm Tube secured with: Tape Dental Injury: Teeth and Oropharynx as per pre-operative assessment

## 2016-04-07 NOTE — Transfer of Care (Signed)
Immediate Anesthesia Transfer of Care Note  Patient: [name]  Procedure(s) Performed: Procedure(s) with comments: THORACIC 7 KYPHOPLASTY (N/A) - THORACIC 7 KYPHOPLASTY  Patient Location: PACU  Anesthesia Type:General  Level of Consciousness: awake, alert  and confused (baseline)  Airway & Oxygen Therapy: Patient Spontanous Breathing and Patient connected to nasal cannula oxygen  Post-op Assessment: Report given to RN, Post -op Vital signs reviewed and stable and Patient moving all extremities X 4  Post vital signs: Reviewed and stable  Last Vitals:  Vitals:   04/07/16 0942  BP: 113/61  Pulse: (!) 58  Resp: 18  Temp: 36.7 C    Last Pain:  Vitals:   04/07/16 0942  TempSrc: Oral         Complications: No apparent anesthesia complications

## 2016-04-07 NOTE — H&P (Signed)
PREOPERATIVE H&P  Chief Complaint: back pain  HPI: [name] is a 75 y.o. female who presents with ongoing pain in the mid back  An updated MRI reveals edema in both the T7 and T6 levels  Patient has failed multiple forms of conservative care and continues to have pain (see office notes for additional details regarding the patient's full course of treatment)  Past Medical History:  Diagnosis Date  . Arthritis   . Dementia    Alzheimers  . Depression   . H/O tubal ligation   . Hx of cervical spine surgery   . Hyperthyroidism 06-2014  . Hypothyroidism   . Myocardial infarction   . Osteoporosis   . Vitamin B12 deficiency    Past Surgical History:  Procedure Laterality Date  . BREAST SURGERY     reduction  . bunionectomies    . FINGER ARTHROPLASTY  04/02/2012   Procedure: FINGER ARTHROPLASTY;  Surgeon: Cliffton Astersavid A. Janee Mornhompson, MD;  [facility];  Service: Orthopedics;  Laterality: Left;  Left Thumb Suspension Arthroplasty   . TONSILLECTOMY    . TUBAL LIGATION     Social History   Social History  . Marital status: Married    Spouse name: N/A  . Number of children: 2  . Years of education: college   Occupational History  . Retired    Social History Main Topics  . Smoking status: Former Smoker    Types: Cigarettes  . Smokeless tobacco: Never Used  . Alcohol use No     Comment: rare  . Drug use: No  . Sexual activity: Not Asked   Other Topics Concern  . None   Social History Narrative  . None   Family History  Problem Relation Age of Onset  . Cancer Mother     mesothelioma  . Cancer Father     mesothelioma  . Dementia Paternal Grandmother    Allergies  Allergen Reactions  . Asa [Aspirin] Nausea And Vomiting and Other (See Comments)    Bleeding ulcers   Prior to Admission medications   Medication Sig Start Date End Date Taking? Authorizing Provider  acetaminophen (TYLENOL) 500 MG tablet Take 500 mg by mouth every 6  (six) hours as needed for mild pain.   Yes Historical Provider, MD  calcium carbonate (OS-CAL - DOSED IN MG OF ELEMENTAL CALCIUM) 1250 (500 CA) MG tablet Take 1 tablet by mouth. With 1000 units vit D.   Yes Historical Provider, MD  cholecalciferol (VITAMIN D) 1000 units tablet Take 1,000 Units by mouth daily.   Yes Historical Provider, MD  citalopram (CELEXA) 20 MG tablet Take 20 mg by mouth daily.   Yes Historical Provider, MD  cyanocobalamin 1000 MCG tablet Take 1,000 mcg by mouth daily. Every other week   Yes Historical Provider, MD  donepezil (ARICEPT) 10 MG tablet Take 10 mg by mouth at bedtime.   Yes Historical Provider, MD  HYDROcodone-acetaminophen (NORCO/VICODIN) 5-325 MG tablet Take 1 tablet by mouth daily as needed. 02/29/16  Yes Historical Provider, MD  methimazole (TAPAZOLE) 5 MG tablet Take 5 mg by mouth daily. Mon-Fri 04/20/15  Yes Historical Provider, MD  NAMENDA XR 28 MG CP24 24 hr capsule Take 1 capsule by mouth daily. 03/04/16  Yes Historical Provider, MD  pravastatin (PRAVACHOL) 10 MG tablet Take 1 tablet by mouth daily. 02/26/16  Yes Historical Provider, MD     All other systems have been reviewed and were otherwise negative with the exception  of those mentioned in the HPI and as above.  Physical Exam: Vitals:   04/07/16 0942  BP: 113/61  Pulse: (!) 58  Resp: 18  Temp: 98 F (36.7 C)    General: Alert, no acute distress Cardiovascular: No pedal edema Respiratory: No cyanosis, no use of accessory musculature Skin: No lesions in the area of chief complaint Neurologic: Sensation intact distally Psychiatric: Patient is competent for consent with normal mood and affect Lymphatic: No axillary or cervical lymphadenopathy  MUSCULOSKELETAL: + TTP mid back  Assessment/Plan: Thoracic 6 and 7 compression fracture Plan for Procedure(s): THORACIC 6 and 7 KYPHOPLASTY   Emilee HeroUMONSKI,Layth Cerezo LEONARD, MD 04/07/2016 1:17 PM

## 2016-04-07 NOTE — Anesthesia Preprocedure Evaluation (Addendum)
Anesthesia Evaluation  Patient identified by MRN, date of birth, ID band Patient awake    Reviewed: Allergy & Precautions, NPO status , Patient's Chart, lab work & pertinent test results  History of Anesthesia Complications Negative for: history of anesthetic complications  Airway Mallampati: II  TM Distance: >3 FB Neck ROM: Full    Dental  (+) Teeth Intact, Dental Advisory Given   Pulmonary former smoker,    breath sounds clear to auscultation       Cardiovascular + Past MI   Rhythm:Regular Rate:Normal     Neuro/Psych alzheimers     GI/Hepatic negative GI ROS, Neg liver ROS,   Endo/Other  Hypothyroidism Hyperthyroidism   Renal/GU negative Renal ROS     Musculoskeletal  (+) Arthritis ,   Abdominal   Peds  Hematology   Anesthesia Other Findings   Reproductive/Obstetrics                            Anesthesia Physical Anesthesia Plan  ASA: III  Anesthesia Plan: General   Post-op Pain Management:    Induction: Intravenous  Airway Management Planned: Oral ETT  Additional Equipment:   Intra-op Plan:   Post-operative Plan: Extubation in OR  Informed Consent: I have reviewed the patients History and Physical, chart, labs and discussed the procedure including the risks, benefits and alternatives for the proposed anesthesia with the patient or authorized representative who has indicated his/her understanding and acceptance.   Dental advisory given  Plan Discussed with: CRNA  Anesthesia Plan Comments:         Anesthesia Quick Evaluation

## 2016-04-08 ENCOUNTER — Encounter (HOSPITAL_COMMUNITY): Payer: Self-pay | Admitting: Orthopedic Surgery

## 2016-04-08 NOTE — Op Note (Signed)
NAMBeatrix [name]:  [name], [name]               ACCOUNT NO.:  [id number]  MEDICAL RECORD NO.:  [id number]  LOCATION:  [facility]                         FACILITY:  [facility]  PHYSICIAN:  Estill BambergMark Ameilia Rattan, MD      DATE OF BIRTH:  [birth year]  DATE OF PROCEDURE:  04/07/2016 DATE OF DISCHARGE:  04/07/2016                              OPERATIVE REPORT   PREOPERATIVE DIAGNOSIS:  T6 and T7 compression fractures.  POSTOPERATIVE DIAGNOSIS:  T6 and T7 compression fractures.  PROCEDURE:  T6 and T7 kyphoplasty.  SURGEON:  Estill BambergMark Parker Sawatzky, M.D.  ASSISTANT:  Jason CoopKayla McKenzie, PA-C.  ANESTHESIA:  General endotracheal anesthesia.  COMPLICATIONS:  None.  DISPOSITION:  Stable.  ESTIMATED BLOOD LOSS:  Minimal.  INDICATIONS FOR SURGERY:  Briefly, [name] is a very pleasant 75- year-old female who I did evaluate for ongoing pain in her mid back.  An MRI did reveal a compression fracture with deformity at T7.  There was also edema noted in the lower endplate of T6 on repeat MRI imaging.  Her pain had been ongoing for over 3 months.  We did treat her with a brace and medications, but her pain did continue.  We therefore did discuss proceeding with the procedure reflected above.  The patient was fully aware of the risks and limitations of surgery, and did elect to proceed.  OPERATIVE DETAILS:  On April 07, 2016, the patient was brought to surgery and general endotracheal anesthesia was administered.  The patient was placed prone on a well-padded flat Jackson bed.  Gel rolls were placed under the patient's chest and hips.  All bony prominences were padded.  The back was prepped and draped and a time-out procedure was performed.  I then brought in AP and lateral fluoroscopy.  I then made stab incisions at the superior aspect of the T6 and T7 pedicles bilaterally.  Jamshidi needles were advanced across the T6 and T7 pedicles in the usual fashion.  I then inserted kyphoplasty balloons. The kyphoplasty balloons in T7 did  not expand very much and the bone was noted to be very sclerotic.  Of note, I did use a drill and a curette through the cannulas prior to advancing the balloons.  Again, there was not much motion at the T7 level and the balloons were only filled with approximately 0.5 mL of contrast bilaterally.  However, at T6, the balloons were able to be inflated with approximately 2 mL of contrast. There was much lower pressure encountered at T6.  I then introduced cement into the T6 and T7 vertebral bodies.  Excellent interdigitation of cement was noted at both T6 and T7.  A significant amount of cement was able to be introduced into the T6 vertebral body.  Only very minimal cement was introduced into T7, however, there was good interdigitation identified.  There was no abnormal extravasation posteriorly, laterally, or anteriorly outside the vertebral bodies.  The cement was then allowed to harden.  The wound was then irrigated.  The wound was then closed using 4-0 Monocryl.  A sterile dressing was then applied. The patient was then awakened from general endotracheal anesthesia and transferred to recovery in stable condition.     Loraine LericheMark  Yevette Edwardsumonski, MD     MD/MEDQ  D:  04/07/2016  T:  04/08/2016  Job:  [id number]

## 2016-05-09 ENCOUNTER — Ambulatory Visit (INDEPENDENT_AMBULATORY_CARE_PROVIDER_SITE_OTHER): Payer: Medicare Other | Admitting: Neurology

## 2016-05-09 ENCOUNTER — Encounter: Payer: Self-pay | Admitting: Neurology

## 2016-05-09 VITALS — BP 129/60 | HR 68 | Ht 64.0 in | Wt 185.0 lb

## 2016-05-09 DIAGNOSIS — G309 Alzheimer's disease, unspecified: Secondary | ICD-10-CM | POA: Diagnosis not present

## 2016-05-09 DIAGNOSIS — F0280 Dementia in other diseases classified elsewhere without behavioral disturbance: Secondary | ICD-10-CM | POA: Diagnosis not present

## 2016-05-09 MED ORDER — NAMENDA XR 28 MG PO CP24: 28.0000 mg | ORAL_CAPSULE | Freq: Every day | ORAL | 3 refills | Status: DC

## 2016-05-09 MED ORDER — DONEPEZIL HCL 10 MG PO TABS: 10.0000 mg | ORAL_TABLET | Freq: Every day | ORAL | 3 refills | Status: DC

## 2016-05-09 NOTE — Progress Notes (Signed)
Reason for visit: Alzheimer's disease  [name] is an 75 y.o. female  History of present illness:  [name] is a 75 year old right-handed white female with a history of Alzheimer's disease. The patient is on Namenda and Aricept, she is tolerating the medications well. The patient continues to decline slowly over time. She is still able to dress herself and take a bath, she may require some increased supervision, she is having some word finding problems. She is sleeping well at night, she has no significant troubles with wandering or with agitation. She lives at home with her husband.  Past Medical History:  Diagnosis Date  . Arthritis   . Dementia    Alzheimers  . Depression   . H/O tubal ligation   . Hx of cervical spine surgery   . Hyperthyroidism 06-2014  . Hypothyroidism   . Myocardial infarction   . Osteoporosis   . Vitamin B12 deficiency     Past Surgical History:  Procedure Laterality Date  . BREAST SURGERY     reduction  . bunionectomies    . FINGER ARTHROPLASTY  04/02/2012   Procedure: FINGER ARTHROPLASTY;  Surgeon: Cliffton Astersavid A. Janee Mornhompson, MD;  [facility];  Service: Orthopedics;  Laterality: Left;  Left Thumb Suspension Arthroplasty   . KYPHOPLASTY N/A 04/07/2016   Procedure: THORACIC 7 KYPHOPLASTY;  Surgeon: Estill BambergMark Dumonski, MD;  Location: [facility];  Service: Orthopedics;  Laterality: N/A;  THORACIC 7 KYPHOPLASTY  . TONSILLECTOMY    . TUBAL LIGATION      Family History  Problem Relation Age of Onset  . Cancer Mother     mesothelioma  . Cancer Father     mesothelioma  . Dementia Paternal Grandmother     Social history:  reports that she has quit smoking. Her smoking use included Cigarettes. She has never used smokeless tobacco. She reports that she does not drink alcohol or use drugs.    Allergies  Allergen Reactions  . Asa [Aspirin] Nausea And Vomiting and Other (See Comments)    Bleeding ulcers    Medications:  Prior to  Admission medications   Medication Sig Start Date End Date Taking? Authorizing Provider  acetaminophen (TYLENOL) 500 MG tablet Take 500 mg by mouth every 6 (six) hours as needed for mild pain.   Yes Historical Provider, MD  alendronate (FOSAMAX) 70 MG tablet  04/29/16  Yes Historical Provider, MD  calcium carbonate (OS-CAL - DOSED IN MG OF ELEMENTAL CALCIUM) 1250 (500 CA) MG tablet Take 1 tablet by mouth. With 1000 units vit D.   Yes Historical Provider, MD  cholecalciferol (VITAMIN D) 1000 units tablet Take 1,000 Units by mouth daily.   Yes Historical Provider, MD  citalopram (CELEXA) 20 MG tablet Take 20 mg by mouth daily.   Yes Historical Provider, MD  cyanocobalamin 1000 MCG tablet Take 1,000 mcg by mouth daily. Every other week   Yes Historical Provider, MD  diazepam (VALIUM) 5 MG tablet  04/07/16  Yes Historical Provider, MD  donepezil (ARICEPT) 10 MG tablet Take 10 mg by mouth at bedtime.   Yes Historical Provider, MD  methimazole (TAPAZOLE) 5 MG tablet Take 5 mg by mouth daily. Mon-Fri 04/20/15  Yes Historical Provider, MD  NAMENDA XR 28 MG CP24 24 hr capsule Take 1 capsule by mouth daily. 03/04/16  Yes Historical Provider, MD  oxyCODONE-acetaminophen (PERCOCET/ROXICET) 5-325 MG tablet  04/07/16  Yes Historical Provider, MD  pravastatin (PRAVACHOL) 10 MG tablet Take 1 tablet by mouth  daily. 02/26/16  Yes Historical Provider, MD    ROS:  Out of a complete 14 system review of symptoms, the patient complains only of the following symptoms, and all other reviewed systems are negative.  Chills Back pain Cold intolerance Memory loss  Blood pressure 129/60, pulse 68, height 5\' 4"  (1.626 m), weight 185 lb (83.9 kg).  Physical Exam  General: The patient is alert and cooperative at the time of the examination.  Skin: No significant peripheral edema is noted.   Neurologic Exam  Mental status: The patient is alert and oriented x 1 at the time of the examination (oriented only to  person). The Mini-Mental Status Examination done today shows a total score 4/30..   Cranial nerves: Facial symmetry is present. Speech is normal, no aphasia or dysarthria is noted. Extraocular movements are full. Visual fields are full.  Motor: The patient has good strength in all 4 extremities.  Sensory examination: Soft touch sensation is symmetric on the face, arms, and legs.  Coordination: The patient has good finger-nose-finger bilaterally. Apraxia is noted with heel-to-shin bilaterally.  Gait and station: The patient has a normal gait. Tandem gait is normal. Romberg is negative. No drift is seen.  Reflexes: Deep tendon reflexes are symmetric.   Assessment/Plan:  1. Alzheimer's disease  The patient continues to progress slowly. She will continue the Aricept and Namenda for now, he will follow-up in 6 months. A prescription was given for these medications.  Marlan Palau. Keith Willis MD 05/09/2016 10:34 AM  [facility] [address] [place], [place] [id number]  Phone [phone] Fax [phone]

## 2016-05-25 DIAGNOSIS — E785 Hyperlipidemia, unspecified: Secondary | ICD-10-CM | POA: Diagnosis not present

## 2016-05-25 DIAGNOSIS — M8580 Other specified disorders of bone density and structure, unspecified site: Secondary | ICD-10-CM | POA: Diagnosis not present

## 2016-05-25 DIAGNOSIS — G309 Alzheimer's disease, unspecified: Secondary | ICD-10-CM | POA: Diagnosis not present

## 2016-05-25 DIAGNOSIS — E039 Hypothyroidism, unspecified: Secondary | ICD-10-CM | POA: Diagnosis not present

## 2016-06-15 DIAGNOSIS — M47816 Spondylosis without myelopathy or radiculopathy, lumbar region: Secondary | ICD-10-CM | POA: Diagnosis not present

## 2016-06-15 DIAGNOSIS — R69 Illness, unspecified: Secondary | ICD-10-CM | POA: Diagnosis not present

## 2016-06-15 DIAGNOSIS — M810 Age-related osteoporosis without current pathological fracture: Secondary | ICD-10-CM | POA: Diagnosis not present

## 2016-06-15 DIAGNOSIS — Z0000 Encounter for general adult medical examination without abnormal findings: Secondary | ICD-10-CM | POA: Diagnosis not present

## 2016-06-15 DIAGNOSIS — Z7983 Long term (current) use of bisphosphonates: Secondary | ICD-10-CM | POA: Diagnosis not present

## 2016-06-15 DIAGNOSIS — E0590 Thyrotoxicosis, unspecified without thyrotoxic crisis or storm: Secondary | ICD-10-CM | POA: Diagnosis not present

## 2016-06-15 DIAGNOSIS — E669 Obesity, unspecified: Secondary | ICD-10-CM | POA: Diagnosis not present

## 2016-06-15 DIAGNOSIS — G309 Alzheimer's disease, unspecified: Secondary | ICD-10-CM | POA: Diagnosis not present

## 2016-06-15 DIAGNOSIS — E785 Hyperlipidemia, unspecified: Secondary | ICD-10-CM | POA: Diagnosis not present

## 2016-06-15 DIAGNOSIS — Z6832 Body mass index (BMI) 32.0-32.9, adult: Secondary | ICD-10-CM | POA: Diagnosis not present

## 2016-08-05 ENCOUNTER — Other Ambulatory Visit: Payer: Self-pay | Admitting: Obstetrics & Gynecology

## 2016-08-05 DIAGNOSIS — Z1231 Encounter for screening mammogram for malignant neoplasm of breast: Secondary | ICD-10-CM

## 2016-08-29 ENCOUNTER — Ambulatory Visit
Admission: RE | Admit: 2016-08-29 | Discharge: 2016-08-29 | Disposition: A | Discharge status: Home / Self Care | Payer: Medicare HMO | Source: Ambulatory Visit | Attending: Obstetrics & Gynecology | Admitting: Obstetrics & Gynecology

## 2016-08-29 DIAGNOSIS — Z1231 Encounter for screening mammogram for malignant neoplasm of breast: Secondary | ICD-10-CM

## 2016-09-12 DIAGNOSIS — K219 Gastro-esophageal reflux disease without esophagitis: Secondary | ICD-10-CM | POA: Diagnosis not present

## 2016-09-12 DIAGNOSIS — M549 Dorsalgia, unspecified: Secondary | ICD-10-CM | POA: Diagnosis not present

## 2016-09-12 DIAGNOSIS — E785 Hyperlipidemia, unspecified: Secondary | ICD-10-CM | POA: Diagnosis not present

## 2016-09-12 DIAGNOSIS — R69 Illness, unspecified: Secondary | ICD-10-CM | POA: Diagnosis not present

## 2016-09-12 DIAGNOSIS — E0590 Thyrotoxicosis, unspecified without thyrotoxic crisis or storm: Secondary | ICD-10-CM | POA: Diagnosis not present

## 2016-09-12 DIAGNOSIS — M1990 Unspecified osteoarthritis, unspecified site: Secondary | ICD-10-CM | POA: Diagnosis not present

## 2016-09-13 DIAGNOSIS — E0590 Thyrotoxicosis, unspecified without thyrotoxic crisis or storm: Secondary | ICD-10-CM | POA: Diagnosis not present

## 2016-09-19 ENCOUNTER — Telehealth: Payer: Self-pay | Admitting: Neurology

## 2016-09-19 MED ORDER — MEMANTINE HCL 10 MG PO TABS: 10.0000 mg | ORAL_TABLET | Freq: Two times a day (BID) | ORAL | 5 refills | Status: DC

## 2016-09-19 NOTE — Telephone Encounter (Signed)
Pt husband calling stating it has been 5 days that he has been trying to get the non extended NAMENDA XR 28 MG CP24 24 hr capsule he is asking to be called.  Pt has been without it for 5 days, please call

## 2016-09-19 NOTE — Addendum Note (Signed)
Addended by: York Spaniel on: 09/19/2016 05:26 PM   Modules accepted: Orders

## 2016-09-19 NOTE — Telephone Encounter (Signed)
Called and spoke with pharmacy tech and she stated patient husband requesting generic non-extended release, memantine. The Namenda is going to cost them around 300 dollars. Advised I will send message to CW,MD to see if ok to switch.

## 2016-09-19 NOTE — Telephone Encounter (Signed)
I have called in a prescription for the Namenda. She will take 10 mg twice daily.

## 2016-09-19 NOTE — Telephone Encounter (Signed)
Pt's husband called, said she is out of the medication. He was advised msg has been sent to provider and is waiting for response.

## 2016-09-26 DIAGNOSIS — R69 Illness, unspecified: Secondary | ICD-10-CM | POA: Diagnosis not present

## 2016-10-26 DIAGNOSIS — E0590 Thyrotoxicosis, unspecified without thyrotoxic crisis or storm: Secondary | ICD-10-CM | POA: Diagnosis not present

## 2016-11-07 ENCOUNTER — Ambulatory Visit (INDEPENDENT_AMBULATORY_CARE_PROVIDER_SITE_OTHER): Payer: Medicare HMO | Admitting: Adult Health

## 2016-11-07 ENCOUNTER — Encounter: Payer: Self-pay | Admitting: Adult Health

## 2016-11-07 VITALS — BP 96/61 | HR 62 | Wt 179.0 lb

## 2016-11-07 DIAGNOSIS — G309 Alzheimer's disease, unspecified: Secondary | ICD-10-CM | POA: Diagnosis not present

## 2016-11-07 DIAGNOSIS — F0280 Dementia in other diseases classified elsewhere without behavioral disturbance: Secondary | ICD-10-CM

## 2016-11-07 DIAGNOSIS — R69 Illness, unspecified: Secondary | ICD-10-CM | POA: Diagnosis not present

## 2016-11-07 NOTE — Progress Notes (Signed)
PATIENT: [name] DOB: [birth year]  REASON FOR VISIT: follow up- Alzheimer's disease HISTORY FROM: patient  HISTORY OF PRESENT ILLNESS: [name] is a 76 year old female with a history of Alzheimer's disease. She returns today for follow-up. She remains on Namenda and Aricept. At her last visit her MMSE was 4 out of 30. She requires assistance with most ADLs. Denies any trouble sleeping. Denies change in appetite. Denies hallucinations. No change in mood or behavior. Husband denies any changes in her gait or balance. She returns today for an evaluation.  HISTORY 05/09/16: [name] is a 76 year old right-handed white female with a history of Alzheimer's disease. The patient is on Namenda and Aricept, she is tolerating the medications well. The patient continues to decline slowly over time. She is still able to dress herself and take a bath, she may require some increased supervision, she is having some word finding problems. She is sleeping well at night, she has no significant troubles with wandering or with agitation. She lives at home with her husband.  REVIEW OF SYSTEMS: Out of a complete 14 system review of symptoms, the patient complains only of the following symptoms, and all other reviewed systems are negative.  ALLERGIES: Allergies  Allergen Reactions  . Asa [Aspirin] Nausea And Vomiting and Other (See Comments)    Bleeding ulcers    HOME MEDICATIONS: Outpatient Medications Prior to Visit  Medication Sig Dispense Refill  . acetaminophen (TYLENOL) 500 MG tablet Take 500 mg by mouth every 6 (six) hours as needed for mild pain.    Marland Kitchen alendronate (FOSAMAX) 70 MG tablet     . calcium carbonate (OS-CAL - DOSED IN MG OF ELEMENTAL CALCIUM) 1250 (500 CA) MG tablet Take 1 tablet by mouth. With 1000 units vit D.    . cholecalciferol (VITAMIN D) 1000 units tablet Take 1,000 Units by mouth daily.    . citalopram (CELEXA) 20 MG tablet Take 20 mg by mouth daily.    .  cyanocobalamin 1000 MCG tablet Take 1,000 mcg by mouth daily. Every other week    . diazepam (VALIUM) 5 MG tablet     . donepezil (ARICEPT) 10 MG tablet Take 1 tablet (10 mg total) by mouth at bedtime. 90 tablet 3  . memantine (NAMENDA) 10 MG tablet Take 1 tablet (10 mg total) by mouth 2 (two) times daily. 60 tablet 5  . methimazole (TAPAZOLE) 5 MG tablet Take 5 mg by mouth daily. Mon-Fri    . oxyCODONE-acetaminophen (PERCOCET/ROXICET) 5-325 MG tablet     . pravastatin (PRAVACHOL) 10 MG tablet Take 1 tablet by mouth daily.     No facility-administered medications prior to visit.     PAST MEDICAL HISTORY: Past Medical History:  Diagnosis Date  . Arthritis   . Dementia    Alzheimers  . Depression   . H/O tubal ligation   . Hx of cervical spine surgery   . Hyperthyroidism 06-2014  . Hypothyroidism   . Myocardial infarction (HCC)   . Osteoporosis   . Vitamin B12 deficiency     PAST SURGICAL HISTORY: Past Surgical History:  Procedure Laterality Date  . BREAST SURGERY     reduction  . bunionectomies    . FINGER ARTHROPLASTY  04/02/2012   Procedure: FINGER ARTHROPLASTY;  Surgeon: Cliffton Asters. [name], MD;  [facility];  Service: Orthopedics;  Laterality: Left;  Left Thumb Suspension Arthroplasty   . KYPHOPLASTY N/A 04/07/2016   Procedure: THORACIC 7 KYPHOPLASTY;  Surgeon: Estill Bamberg,  MD;  Location: [facility];  Service: Orthopedics;  Laterality: N/A;  THORACIC 7 KYPHOPLASTY  . REDUCTION MAMMAPLASTY    . TONSILLECTOMY    . TUBAL LIGATION      FAMILY HISTORY: Family History  Problem Relation Age of Onset  . Cancer Mother        mesothelioma  . Cancer Father        mesothelioma  . Dementia Paternal Grandmother     SOCIAL HISTORY: Social History   Social History  . Marital status: Married    Spouse name: N/A  . Number of children: 2  . Years of education: college   Occupational History  . Retired    Social History Main Topics  . Smoking status:  Former Smoker    Types: Cigarettes  . Smokeless tobacco: Never Used  . Alcohol use No     Comment: rare  . Drug use: No  . Sexual activity: Not on file   Other Topics Concern  . Not on file   Social History Narrative   Lives at home w/ her husband      PHYSICAL EXAM  Vitals:   11/07/16 0949  BP: 96/61  Pulse: 62  Weight: 179 lb (81.2 kg)   Body mass index is 30.73 kg/m.  Generalized: Well developed, in no acute distress   Neurological examination  Mentation: Alert.  Follows all commands  Cranial nerve II-XII: Pupils were equal round reactive to light. Extraocular movements were full, visual field were full on confrontational test. Facial sensation and strength were normal. Uvula tongue midline. Head turning and shoulder shrug  were normal and symmetric. Motor: The motor testing reveals 5 over 5 strength of all 4 extremities. Good symmetric motor tone is noted throughout.  Sensory: Sensory testing is intact to soft touch on all 4 extremities. No evidence of extinction is noted.  Coordination: Cerebellar testing reveals good finger-nose-finger and heel-to-shin bilaterally.  Gait and station: Gait is normal.  Reflexes: Deep tendon reflexes are symmetric and normal bilaterally.   DIAGNOSTIC DATA (LABS, IMAGING, TESTING) - I reviewed patient records, labs, notes, testing and imaging myself where available.  Lab Results  Component Value Date   WBC 7.4 04/07/2016   HGB 12.8 04/07/2016   HCT 40.4 04/07/2016   MCV 86.9 04/07/2016   PLT 217 04/07/2016      Component Value Date/Time   NA 138 04/07/2016 1034   K 4.4 04/07/2016 1034   CL 107 04/07/2016 1034   CO2 24 04/07/2016 1034   GLUCOSE 99 04/07/2016 1034   BUN 14 04/07/2016 1034   CREATININE 0.66 04/07/2016 1034   CALCIUM 8.8 (L) 04/07/2016 1034   PROT 6.1 (L) 04/07/2016 1034   ALBUMIN 3.6 04/07/2016 1034   AST 18 04/07/2016 1034   ALT 13 (L) 04/07/2016 1034   ALKPHOS 102 04/07/2016 1034   BILITOT 0.6  04/07/2016 1034   GFRNONAA >60 04/07/2016 1034   GFRAA >60 04/07/2016 1034      ASSESSMENT AND PLAN 76 y.o. year old female  has a past medical history of Arthritis; Dementia; Depression; H/O tubal ligation; cervical spine surgery; Hyperthyroidism (06-2014); Hypothyroidism; Myocardial infarction [facility]); Osteoporosis; and Vitamin B12 deficiency. here with:  1. Alzheimer's disease  The patient's memory score was not repeated at this visit as it was previously 4 out of 30. The patient will remain on Aricept and Namenda. Denies any other symptoms at this time. Patient and her husband are advised that if her symptoms worsen or she develops  new symptoms she should let us know. She will follow-up in 6 months with Dr. Anne HahnWillis .  I spent 15 minutes with the patient. 50% of this time was spent discussing diagnosis and treatment.      Butch PennyMegan Cylas Falzone, MSN, NP-C 11/07/2016, 9:47 AM [facility] [address], [address] [place], [place] [address] [phone]

## 2016-11-07 NOTE — Progress Notes (Signed)
I have read the note, and I agree with the clinical assessment and plan.  Ameriah Lint KEITH   

## 2016-11-07 NOTE — Patient Instructions (Signed)
Continue Aricept and Namenda Last memory score was 4/30- will continue to monitor If your symptoms worsen or you develop new symptoms please let us know.

## 2016-11-14 DIAGNOSIS — M546 Pain in thoracic spine: Secondary | ICD-10-CM | POA: Diagnosis not present

## 2016-11-14 DIAGNOSIS — M545 Low back pain: Secondary | ICD-10-CM | POA: Diagnosis not present

## 2016-11-16 DIAGNOSIS — M546 Pain in thoracic spine: Secondary | ICD-10-CM | POA: Diagnosis not present

## 2016-11-21 DIAGNOSIS — M546 Pain in thoracic spine: Secondary | ICD-10-CM | POA: Diagnosis not present

## 2016-12-05 DIAGNOSIS — E039 Hypothyroidism, unspecified: Secondary | ICD-10-CM | POA: Diagnosis not present

## 2016-12-05 DIAGNOSIS — G309 Alzheimer's disease, unspecified: Secondary | ICD-10-CM | POA: Diagnosis not present

## 2016-12-05 DIAGNOSIS — E7800 Pure hypercholesterolemia, unspecified: Secondary | ICD-10-CM | POA: Diagnosis not present

## 2016-12-05 DIAGNOSIS — M8580 Other specified disorders of bone density and structure, unspecified site: Secondary | ICD-10-CM | POA: Diagnosis not present

## 2016-12-05 DIAGNOSIS — H6122 Impacted cerumen, left ear: Secondary | ICD-10-CM | POA: Diagnosis not present

## 2016-12-05 DIAGNOSIS — E538 Deficiency of other specified B group vitamins: Secondary | ICD-10-CM | POA: Diagnosis not present

## 2016-12-05 DIAGNOSIS — Z23 Encounter for immunization: Secondary | ICD-10-CM | POA: Diagnosis not present

## 2016-12-05 DIAGNOSIS — Z0000 Encounter for general adult medical examination without abnormal findings: Secondary | ICD-10-CM | POA: Diagnosis not present

## 2017-01-11 DIAGNOSIS — M8588 Other specified disorders of bone density and structure, other site: Secondary | ICD-10-CM | POA: Diagnosis not present

## 2017-01-18 DIAGNOSIS — E0590 Thyrotoxicosis, unspecified without thyrotoxic crisis or storm: Secondary | ICD-10-CM | POA: Diagnosis not present

## 2017-02-08 DIAGNOSIS — N390 Urinary tract infection, site not specified: Secondary | ICD-10-CM | POA: Diagnosis not present

## 2017-02-11 DIAGNOSIS — R69 Illness, unspecified: Secondary | ICD-10-CM | POA: Diagnosis not present

## 2017-03-21 DIAGNOSIS — E0590 Thyrotoxicosis, unspecified without thyrotoxic crisis or storm: Secondary | ICD-10-CM | POA: Diagnosis not present

## 2017-03-22 DIAGNOSIS — Z0100 Encounter for examination of eyes and vision without abnormal findings: Secondary | ICD-10-CM | POA: Diagnosis not present

## 2017-03-22 DIAGNOSIS — H524 Presbyopia: Secondary | ICD-10-CM | POA: Diagnosis not present

## 2017-03-25 ENCOUNTER — Other Ambulatory Visit: Payer: Self-pay | Admitting: Neurology

## 2017-04-05 DIAGNOSIS — R69 Illness, unspecified: Secondary | ICD-10-CM | POA: Diagnosis not present

## 2017-04-12 DIAGNOSIS — K219 Gastro-esophageal reflux disease without esophagitis: Secondary | ICD-10-CM | POA: Diagnosis not present

## 2017-04-12 DIAGNOSIS — E0590 Thyrotoxicosis, unspecified without thyrotoxic crisis or storm: Secondary | ICD-10-CM | POA: Diagnosis not present

## 2017-04-12 DIAGNOSIS — E785 Hyperlipidemia, unspecified: Secondary | ICD-10-CM | POA: Diagnosis not present

## 2017-04-12 DIAGNOSIS — R69 Illness, unspecified: Secondary | ICD-10-CM | POA: Diagnosis not present

## 2017-05-10 ENCOUNTER — Ambulatory Visit: Payer: Medicare HMO | Admitting: Neurology

## 2017-05-18 ENCOUNTER — Other Ambulatory Visit: Payer: Self-pay | Admitting: Neurology

## 2017-06-19 DIAGNOSIS — E039 Hypothyroidism, unspecified: Secondary | ICD-10-CM | POA: Diagnosis not present

## 2017-06-19 DIAGNOSIS — E559 Vitamin D deficiency, unspecified: Secondary | ICD-10-CM | POA: Diagnosis not present

## 2017-06-19 DIAGNOSIS — E538 Deficiency of other specified B group vitamins: Secondary | ICD-10-CM | POA: Diagnosis not present

## 2017-06-19 DIAGNOSIS — G309 Alzheimer's disease, unspecified: Secondary | ICD-10-CM | POA: Diagnosis not present

## 2017-06-19 DIAGNOSIS — E785 Hyperlipidemia, unspecified: Secondary | ICD-10-CM | POA: Diagnosis not present

## 2017-06-20 DIAGNOSIS — E538 Deficiency of other specified B group vitamins: Secondary | ICD-10-CM | POA: Diagnosis not present

## 2017-06-20 DIAGNOSIS — E559 Vitamin D deficiency, unspecified: Secondary | ICD-10-CM | POA: Diagnosis not present

## 2017-06-20 DIAGNOSIS — E785 Hyperlipidemia, unspecified: Secondary | ICD-10-CM | POA: Diagnosis not present

## 2017-06-20 DIAGNOSIS — E039 Hypothyroidism, unspecified: Secondary | ICD-10-CM | POA: Diagnosis not present

## 2017-06-28 ENCOUNTER — Encounter: Payer: Self-pay | Admitting: Neurology

## 2017-06-28 ENCOUNTER — Ambulatory Visit: Payer: Medicare HMO | Admitting: Neurology

## 2017-06-28 ENCOUNTER — Telehealth: Payer: Self-pay | Admitting: Neurology

## 2017-06-28 VITALS — BP 117/70 | HR 70 | Ht 64.0 in | Wt 178.0 lb

## 2017-06-28 DIAGNOSIS — G309 Alzheimer's disease, unspecified: Secondary | ICD-10-CM

## 2017-06-28 DIAGNOSIS — F0280 Dementia in other diseases classified elsewhere without behavioral disturbance: Secondary | ICD-10-CM

## 2017-06-28 DIAGNOSIS — R69 Illness, unspecified: Secondary | ICD-10-CM | POA: Diagnosis not present

## 2017-06-28 MED ORDER — ESCITALOPRAM OXALATE 5 MG PO TABS: 5.0000 mg | ORAL_TABLET | Freq: Every day | ORAL | 3 refills | Status: DC

## 2017-06-28 NOTE — Addendum Note (Signed)
Addended by: York SpanielWILLIS, Lennin Osmond K on: 06/28/2017 05:53 PM   Modules accepted: Orders

## 2017-06-28 NOTE — Telephone Encounter (Signed)
Pt husband has called to inform that Dr Anne HahnWillis prescribed escitalopram (LEXAPRO) 5 MG tablet but on bottle it says not to take it pt takes citalopram (CELEXA) 20 MG tablet, which pt actually does,  Pt husband is asking to be called back

## 2017-06-28 NOTE — Telephone Encounter (Signed)
I called the husband.  The patient was already on Celexa, they are to double the dose to 40 mg to see if this helps, if it does, I will call in a 40 mg tablet of this medication.  No reason to take the Lexapro.

## 2017-06-28 NOTE — Progress Notes (Signed)
Reason for visit: Alzheimer's disease  [name] is an 77 y.o. female  History of present illness:  [name] is a 77 year old right-handed white female with a history of Alzheimer's disease.  The patient has continued to progress slowly with her memory.  She lives at home with her husband and [facility].  The patient is having some mild issues with agitation at times, not severe.  She does not wander from the house.  Sleeps well at night.  She eats well, she remains on Aricept and Namenda.  The patient is gradually losing her ability to verbalize.  She will have some incontinence at times when she has to go to the bathroom.  The patient requires some assistance with bathing and dressing.  There have been no hallucinations.  The patient no longer recognizes her reflection in the mirror, she thinks that it is a neighbor or friend and she will talk to herself in the mirror.  The patient cannot remember the name of her husband but she does know that her husband is her spouse.  The patient returns to this office for an evaluation.  Past Medical History:  Diagnosis Date  . Arthritis   . Dementia    Alzheimers  . Depression   . H/O tubal ligation   . Hx of cervical spine surgery   . Hyperthyroidism 06-2014  . Hypothyroidism   . Myocardial infarction (HCC)   . Osteoporosis   . Vitamin B12 deficiency     Past Surgical History:  Procedure Laterality Date  . BREAST SURGERY     reduction  . bunionectomies    . FINGER ARTHROPLASTY  04/02/2012   Procedure: FINGER ARTHROPLASTY;  Surgeon: Cliffton Astersavid A. Janee Mornhompson, MD;  [facility];  Service: Orthopedics;  Laterality: Left;  Left Thumb Suspension Arthroplasty   . KYPHOPLASTY N/A 04/07/2016   Procedure: THORACIC 7 KYPHOPLASTY;  Surgeon: Estill BambergMark Dumonski, MD;  Location: [facility];  Service: Orthopedics;  Laterality: N/A;  THORACIC 7 KYPHOPLASTY  . REDUCTION MAMMAPLASTY    .  TONSILLECTOMY    . TUBAL LIGATION      Family History  Problem Relation Age of Onset  . Cancer Mother        mesothelioma  . Cancer Father        mesothelioma  . Dementia Paternal Grandmother     Social history:  reports that she has quit smoking. Her smoking use included cigarettes. she has never used smokeless tobacco. She reports that she does not drink alcohol or use drugs.    Allergies  Allergen Reactions  . Asa [Aspirin] Nausea And Vomiting and Other (See Comments)    Bleeding ulcers    Medications:  Prior to Admission medications   Medication Sig Start Date End Date Taking? Authorizing Provider  acetaminophen (TYLENOL) 500 MG tablet Take 500 mg by mouth every 6 (six) hours as needed for mild pain.   Yes [provider]  alendronate (FOSAMAX) 70 MG tablet  04/29/16  Yes [provider]  calcium carbonate (OS-CAL - DOSED IN MG OF ELEMENTAL CALCIUM) 1250 (500 CA) MG tablet Take 1 tablet by mouth. With 1000 units vit D.   Yes [provider]  cholecalciferol (VITAMIN D) 1000 units tablet Take 1,000 Units by mouth daily.   Yes [provider]  citalopram (CELEXA) 20 MG tablet Take 20 mg by mouth daily.   Yes [provider]  cyanocobalamin 1000  MCG tablet Take 1,000 mcg by mouth daily. Every other week   Yes [provider]  donepezil (ARICEPT) 10 MG tablet TAKE ONE TABLET BY MOUTH AT BEDTIME 05/18/17  Yes York Spaniel, MD  memantine (NAMENDA) 10 MG tablet TAKE 1 TABLET BY MOUTH TWICE DAILY 03/27/17  Yes York Spaniel, MD  methimazole (TAPAZOLE) 5 MG tablet Take 5 mg by mouth daily. Mon-Fri 04/20/15  Yes [provider]  pravastatin (PRAVACHOL) 10 MG tablet Take 1 tablet by mouth daily. 02/26/16  Yes [provider]  escitalopram (LEXAPRO) 5 MG tablet Take 1 tablet (5 mg total) by mouth daily. 06/28/17   York Spaniel, MD    ROS:  Out of a complete 14 system review of symptoms, the patient  complains only of the following symptoms, and all other reviewed systems are negative.  Decreased activity, chills Hearing loss, runny nose Cold intolerance Frequency of urination, urinary urgency Back pain, walking difficulty Memory loss Confusion, hyperactivity  Blood pressure 117/70, pulse 70, height 5\' 4"  (1.626 m), weight 178 lb (80.7 kg).  Physical Exam  General: The patient is alert and cooperative at the time of the examination.  Patient is moderately obese.  Skin: No significant peripheral edema is noted.   Neurologic Exam  Mental status: The patient is alert and oriented x 1 at the time of the examination (not oriented to date or place)   Cranial nerves: Facial symmetry is present. Speech is normal, no aphasia or dysarthria is noted. Extraocular movements are full. Visual fields are full.  Verbal output is somewhat decreased.  She does have some word finding problems.  Motor: The patient has good strength in all 4 extremities.  Sensory examination: Soft touch sensation is symmetric on the face, arms, and legs.  Coordination: The patient has good finger-nose-finger bilaterally.  The patient has severe apraxia with use of the lower extremities.  Gait and station: The patient has a limping type gait.  The patient is able to ambulate independently.  Reflexes: Deep tendon reflexes are symmetric.   Assessment/Plan:  1.  Alzheimer's disease  The patient is suffering from severe dementia at this time.  They will continue Aricept and Namenda, the patient is still living in the home environment.  The patient is quite hyperactive during the day, always doing something, there is some underlying anxiety.  The patient will be placed on Lexapro 5 mg daily.  The patient's husband will call for any dose adjustments of the medication.  They will follow-up in about 8 months.   Addendum: The patient is already on Celexa 20 mg a day. The patient will not take the Lexapro and will  increase the Celexa instead to 40 mg a day.   Marlan Palau MD 06/28/2017 12:57 PM  [facility] [address] [place], [place] [address]  Phone [phone] Fax [phone]

## 2017-07-10 DIAGNOSIS — M25561 Pain in right knee: Secondary | ICD-10-CM | POA: Diagnosis not present

## 2017-07-25 DIAGNOSIS — M545 Low back pain: Secondary | ICD-10-CM | POA: Diagnosis not present

## 2017-07-25 DIAGNOSIS — M546 Pain in thoracic spine: Secondary | ICD-10-CM | POA: Diagnosis not present

## 2017-07-31 DIAGNOSIS — E039 Hypothyroidism, unspecified: Secondary | ICD-10-CM | POA: Diagnosis not present

## 2017-07-31 DIAGNOSIS — G309 Alzheimer's disease, unspecified: Secondary | ICD-10-CM | POA: Diagnosis not present

## 2017-07-31 DIAGNOSIS — M791 Myalgia, unspecified site: Secondary | ICD-10-CM | POA: Diagnosis not present

## 2017-07-31 DIAGNOSIS — M549 Dorsalgia, unspecified: Secondary | ICD-10-CM | POA: Diagnosis not present

## 2017-09-04 DIAGNOSIS — E0590 Thyrotoxicosis, unspecified without thyrotoxic crisis or storm: Secondary | ICD-10-CM | POA: Diagnosis not present

## 2017-09-11 DIAGNOSIS — E0590 Thyrotoxicosis, unspecified without thyrotoxic crisis or storm: Secondary | ICD-10-CM | POA: Diagnosis not present

## 2017-09-13 ENCOUNTER — Other Ambulatory Visit: Payer: Self-pay | Admitting: Neurology

## 2017-09-29 DIAGNOSIS — M546 Pain in thoracic spine: Secondary | ICD-10-CM | POA: Diagnosis not present

## 2017-10-04 ENCOUNTER — Other Ambulatory Visit: Payer: Self-pay | Admitting: Family Medicine

## 2017-10-04 DIAGNOSIS — Z1231 Encounter for screening mammogram for malignant neoplasm of breast: Secondary | ICD-10-CM

## 2017-10-11 DIAGNOSIS — R69 Illness, unspecified: Secondary | ICD-10-CM | POA: Diagnosis not present

## 2017-11-01 ENCOUNTER — Ambulatory Visit
Admission: RE | Admit: 2017-11-01 | Discharge: 2017-11-01 | Disposition: A | Discharge status: Home / Self Care | Payer: Medicare HMO | Source: Ambulatory Visit | Attending: Family Medicine | Admitting: Family Medicine

## 2017-11-01 DIAGNOSIS — Z1231 Encounter for screening mammogram for malignant neoplasm of breast: Secondary | ICD-10-CM | POA: Diagnosis not present

## 2017-11-08 DIAGNOSIS — K219 Gastro-esophageal reflux disease without esophagitis: Secondary | ICD-10-CM | POA: Diagnosis not present

## 2017-11-08 DIAGNOSIS — M1990 Unspecified osteoarthritis, unspecified site: Secondary | ICD-10-CM | POA: Diagnosis not present

## 2017-11-08 DIAGNOSIS — R69 Illness, unspecified: Secondary | ICD-10-CM | POA: Diagnosis not present

## 2017-11-08 DIAGNOSIS — E785 Hyperlipidemia, unspecified: Secondary | ICD-10-CM | POA: Diagnosis not present

## 2017-12-13 DIAGNOSIS — R69 Illness, unspecified: Secondary | ICD-10-CM | POA: Diagnosis not present

## 2017-12-13 DIAGNOSIS — E538 Deficiency of other specified B group vitamins: Secondary | ICD-10-CM | POA: Diagnosis not present

## 2017-12-13 DIAGNOSIS — Z0000 Encounter for general adult medical examination without abnormal findings: Secondary | ICD-10-CM | POA: Diagnosis not present

## 2017-12-13 DIAGNOSIS — R300 Dysuria: Secondary | ICD-10-CM | POA: Diagnosis not present

## 2017-12-13 DIAGNOSIS — E039 Hypothyroidism, unspecified: Secondary | ICD-10-CM | POA: Diagnosis not present

## 2017-12-13 DIAGNOSIS — G309 Alzheimer's disease, unspecified: Secondary | ICD-10-CM | POA: Diagnosis not present

## 2017-12-13 DIAGNOSIS — H6122 Impacted cerumen, left ear: Secondary | ICD-10-CM | POA: Diagnosis not present

## 2017-12-13 DIAGNOSIS — M8580 Other specified disorders of bone density and structure, unspecified site: Secondary | ICD-10-CM | POA: Diagnosis not present

## 2017-12-13 DIAGNOSIS — E7800 Pure hypercholesterolemia, unspecified: Secondary | ICD-10-CM | POA: Diagnosis not present

## 2018-01-01 ENCOUNTER — Encounter: Payer: Self-pay | Admitting: Internal Medicine

## 2018-01-01 ENCOUNTER — Ambulatory Visit (INDEPENDENT_AMBULATORY_CARE_PROVIDER_SITE_OTHER): Payer: Medicare HMO | Admitting: Internal Medicine

## 2018-01-01 VITALS — BP 120/70 | HR 57 | Temp 98.2°F | Ht 64.0 in | Wt 177.0 lb

## 2018-01-01 DIAGNOSIS — D518 Other vitamin B12 deficiency anemias: Secondary | ICD-10-CM

## 2018-01-01 DIAGNOSIS — F02818 Dementia in other diseases classified elsewhere, unspecified severity, with other behavioral disturbance: Secondary | ICD-10-CM

## 2018-01-01 DIAGNOSIS — M810 Age-related osteoporosis without current pathological fracture: Secondary | ICD-10-CM

## 2018-01-01 DIAGNOSIS — G300 Alzheimer's disease with early onset: Secondary | ICD-10-CM | POA: Diagnosis not present

## 2018-01-01 DIAGNOSIS — R69 Illness, unspecified: Secondary | ICD-10-CM | POA: Diagnosis not present

## 2018-01-01 DIAGNOSIS — F0281 Dementia in other diseases classified elsewhere with behavioral disturbance: Secondary | ICD-10-CM

## 2018-01-01 MED ORDER — DIVALPROEX SODIUM 125 MG PO CSDR: 125.0000 mg | DELAYED_RELEASE_CAPSULE | Freq: Two times a day (BID) | ORAL | 3 refills | Status: DC

## 2018-01-01 NOTE — Progress Notes (Signed)
Location:  [facility] clinic Provider: Donnamaria Shands L. Renato Gails, D.O., C.M.D.  Goals of Care:  Advanced Directives 04/07/2016  Does Patient Have a Medical Advance Directive? Yes  Type of Estate agent of [name];Living will  Copy of Healthcare Power of Attorney in Chart? No - copy requested   Chief Complaint  Patient presents with  . Acute Visit    [facility]    HPI: Patient is a 77 y.o. female seen today for an acute visit for behavioral concerns from the [facility].    Her husband is curious about her medications:  Stays cold even with the thyroid medication.  TSH is good now per her husband for the last two blood test.  Sometimes a fight to get am meds.  Morninig she asks why she takes and says she doesn't need, etc.  Evening sare ok.  Showers are a challenge--maybe two minutes long.  He makes her get back in and use soap--she curses him.  Takes namenda and aricept which may not be helping any longer.  Scoring 3/30 on mmse.    Is getting aggressive, agitated and argumentative.  Continuous wanting to do what she wants to do.  If she sits, she falls asleep.  Also burning him out.  Sleeps like she's unconscious.  Uses depends at night b/c gets lost if they are gone anywhere she cannot find the bathroom. She is fine at home using their bathroom.  Needs help with all of her activities now.  Has a safety pendant with his number and her phone number and address.  Has not wandered at home.  Will go outside to pick up leaves or pick flowers and put in vase.   B12 levels have been good, but not taking daily now.  Was sky high with daily use.  citalopram was recently changed to escitalopram.  No improvement with this change.  Has not seemed anxious or depressed.  Had a couple of episodes of tearfulness when talking about her mother.  Thinking they're in her mother's house.  Is very moody at times--snapping at people.  Then other times, she will turn around and  be real nice and interactive.  Remembers her curse words.  Did go off on people that didn't pay bill during her work.    Memory loss began 10-12 years ago after their son died in 10/22/2004.  Got lost with the car.  Didn't get bad until last 3-4 years.  She just had a liver panel with her PCP.  It was normal.    Past Medical History:  Diagnosis Date  . Arthritis   . Dementia    Alzheimers  . Depression   . H/O tubal ligation   . Hx of cervical spine surgery   . Hyperthyroidism 06-2014  . Hypothyroidism   . Myocardial infarction (HCC)   . Osteoporosis   . Vitamin B12 deficiency     Past Surgical History:  Procedure Laterality Date  . BREAST SURGERY     reduction  . bunionectomies    . FINGER ARTHROPLASTY  04/02/2012   Procedure: FINGER ARTHROPLASTY;  Surgeon: Cliffton Asters. [name], MD;  Location: [facility];  Service: Orthopedics;  Laterality: Left;  Left Thumb Suspension Arthroplasty   . KYPHOPLASTY N/A 04/07/2016   Procedure: THORACIC 7 KYPHOPLASTY;  Surgeon: Estill Bamberg, MD;  [facility]: [facility];  Service: Orthopedics;  Laterality: N/A;  THORACIC 7 KYPHOPLASTY  . REDUCTION MAMMAPLASTY    . TONSILLECTOMY    .  TUBAL LIGATION      Allergies  Allergen Reactions  . Asa [Aspirin] Nausea And Vomiting and Other (See Comments)    Bleeding ulcers    Outpatient Encounter Medications as of 01/01/2018  Medication Sig  . acetaminophen (TYLENOL) 500 MG tablet Take 500 mg by mouth every 6 (six) hours as needed for mild pain.  Marland Kitchen. alendronate (FOSAMAX) 70 MG tablet   . calcium carbonate (OS-CAL - DOSED IN MG OF ELEMENTAL CALCIUM) 1250 (500 CA) MG tablet Take 1 tablet by mouth. With 1000 units vit D.  . cholecalciferol (VITAMIN D) 1000 units tablet Take 1,000 Units by mouth daily.  . cyanocobalamin 1000 MCG tablet Take 1,000 mcg by mouth daily. Every other week  . donepezil (ARICEPT) 10 MG tablet TAKE ONE TABLET BY MOUTH AT BEDTIME  . escitalopram (LEXAPRO) 10 MG tablet TAKE 1 TABLET  BY MOUTH ONCE DAILY FOR 30 DAYS  . memantine (NAMENDA) 10 MG tablet TAKE 1 TABLET BY MOUTH TWICE DAILY  . methimazole (TAPAZOLE) 5 MG tablet Take 5 mg by mouth daily. Mon-Fri  . pravastatin (PRAVACHOL) 10 MG tablet Take 1 tablet by mouth daily.  . [DISCONTINUED] citalopram (CELEXA) 20 MG tablet Take 20 mg by mouth daily.   No facility-administered encounter medications on file as of 01/01/2018.     Review of Systems:  Review of Systems  Constitutional: Negative for chills and fever.  HENT: Negative for congestion and hearing loss.   Eyes: Negative for blurred vision.  Respiratory: Negative for shortness of breath.   Cardiovascular: Negative for chest pain and palpitations.  Gastrointestinal: Negative for abdominal pain.  Genitourinary: Negative for dysuria.  Musculoskeletal: Positive for back pain. Negative for falls and joint pain.  Neurological: Negative for dizziness and loss of consciousness.  Psychiatric/Behavioral: Positive for memory loss. Negative for depression. The patient is not nervous/anxious and does not have insomnia.     Health Maintenance  Topic Date Due  . TETANUS/TDAP  12/06/1961  . COLONOSCOPY  12/06/1992  . DEXA SCAN  12/07/2007  . PNA vac Low Risk Adult (1 of 2 - PCV13) 12/07/2007  . INFLUENZA VACCINE  12/21/2017    Physical Exam: Vitals:   01/01/18 1339  BP: 120/70  Pulse: (!) 57  Temp: 98.2 F (36.8 C)  TempSrc: Oral  SpO2: 96%  Weight: 177 lb (80.3 kg)  Height: 5\' 4"  (1.626 m)   Body mass index is 30.38 kg/m. Physical Exam  Constitutional: She appears well-developed and well-nourished. No distress.  Cardiovascular: Normal rate, regular rhythm, normal heart sounds and intact distal pulses.  Pulmonary/Chest: Effort normal and breath sounds normal. No respiratory distress.  Neurological: She is alert.  Skin: Skin is warm and dry.  Psychiatric: She has a normal mood and affect.  Pleasant, keeps asking what her husband is talking about, when  he's talking, she gives him a look of confusion, asks "am I ok?"    Labs reviewed:  Assessment/Plan 1. Early onset Alzheimer's disease with behavioral disturbance - d/c lexapro--her husband notes that the citalopram and later escitalopram  -she is getting agitated and wandering, trying to leave the memory center and there are concerns for self harm and staff harm  - divalproex (DEPAKOTE SPRINKLES) 125 MG capsule; Take 1 capsule (125 mg total) by mouth 2 (two) times daily.  Dispense: 60 capsule; Refill: 3 -her husband will contact me by Earleen Reapermychart or via [facility]  2. Other vitamin B12 deficiency anemia -cont b12 supplement which is a  few days a week  3. Postmenopausal osteoporosis -remains on fosamax--has had prior compression fxs  Labs/tests ordered:  No orders of the defined types were placed in this encounter.  Next appt:  Prn  Lachrista Heslin L. Maevis Mumby, D.O. Geriatrics [facility] [address] [place], [place] [address] Cell Phone (Mon-Fri 8am-5pm):  [phone] On Call:  [phone] & follow prompts after 5pm & weekends Office Phone:  [phone] Office Fax:  [phone]

## 2018-01-01 NOTE — Patient Instructions (Addendum)
Stop lexapro.   Start depakote 125mg  sprinkles twice a day.  Can be given in her am coffee and with her evening meal.   Send me a message in mychart to let me now how it's going.  If she gets too sleepy, we can move the medicine to just evening.  If it's not doing enough, we can increase the dosage.

## 2018-01-10 ENCOUNTER — Encounter: Payer: Self-pay | Admitting: Internal Medicine

## 2018-01-31 ENCOUNTER — Telehealth: Payer: Self-pay | Admitting: Neurology

## 2018-01-31 MED ORDER — ESCITALOPRAM OXALATE 10 MG PO TABS: 10.0000 mg | ORAL_TABLET | Freq: Every day | ORAL | 3 refills | Status: DC

## 2018-01-31 NOTE — Telephone Encounter (Signed)
[name] husband/DPR called Dr Bufford Spikes has prescribed depakote 240 twice daily for the past month for aggression and agitation. He said the information he has read about this medication is not for this and it is not helping. He said she is thinking about starting the pt on tegretol and d/c depakote. He is wanting Dr Lacretia Nicks opinion on this and if there is anything he could recommend. Please call to advise

## 2018-01-31 NOTE — Telephone Encounter (Signed)
The patient still having some troubles with agitation.  The patient has been on Depakote taking 250 mg twice daily without benefit, the husband is concerned about potential side effects of this medication.  Patient is off of Celexa, we will go back on Lexapro 10 mg daily, if she is still having a lot of agitation and 2 weeks later contact me and we will go up to 20 mg daily.

## 2018-02-03 DIAGNOSIS — R69 Illness, unspecified: Secondary | ICD-10-CM | POA: Diagnosis not present

## 2018-02-07 MED ORDER — ESCITALOPRAM OXALATE 20 MG PO TABS: 20.0000 mg | ORAL_TABLET | Freq: Every day | ORAL | 3 refills | Status: DC

## 2018-02-07 MED ORDER — ALPRAZOLAM 0.5 MG PO TABS: 0.5000 mg | ORAL_TABLET | Freq: Three times a day (TID) | ORAL | 1 refills | Status: DC | PRN

## 2018-02-07 NOTE — Telephone Encounter (Signed)
Patient's husband Janyth Pupaicholas ([place]) states escitalopram (LEXAPRO) 10 MG tablet has helped minimally. Please call to discuss increasing the dosage.

## 2018-02-07 NOTE — Addendum Note (Signed)
Addended by: York SpanielWILLIS, Sterlin Knightly K on: 02/07/2018 01:34 PM   Modules accepted: Orders

## 2018-02-07 NOTE — Telephone Encounter (Signed)
I called and talk with the son.  The patient is still having some episodes of significant agitation.  We will double the Lexapro dose to 20 mg, I will add low-dose alprazolam to take if needed for episodes of agitation.

## 2018-02-14 DIAGNOSIS — E039 Hypothyroidism, unspecified: Secondary | ICD-10-CM | POA: Diagnosis not present

## 2018-02-14 DIAGNOSIS — E538 Deficiency of other specified B group vitamins: Secondary | ICD-10-CM | POA: Diagnosis not present

## 2018-02-14 DIAGNOSIS — E785 Hyperlipidemia, unspecified: Secondary | ICD-10-CM | POA: Diagnosis not present

## 2018-02-14 DIAGNOSIS — E559 Vitamin D deficiency, unspecified: Secondary | ICD-10-CM | POA: Diagnosis not present

## 2018-02-14 DIAGNOSIS — G309 Alzheimer's disease, unspecified: Secondary | ICD-10-CM | POA: Diagnosis not present

## 2018-02-19 ENCOUNTER — Telehealth: Payer: Self-pay | Admitting: *Deleted

## 2018-02-19 NOTE — Telephone Encounter (Signed)
Faxed signed order to [facility] re new order for: alprazolam 0.5 mg tab- 1 tab TID prn for anxiety.

## 2018-02-28 ENCOUNTER — Ambulatory Visit: Payer: Medicare HMO | Admitting: Adult Health

## 2018-02-28 ENCOUNTER — Encounter: Payer: Self-pay | Admitting: Adult Health

## 2018-02-28 VITALS — BP 111/65 | HR 70 | Ht 64.0 in | Wt 168.0 lb

## 2018-02-28 DIAGNOSIS — F0281 Dementia in other diseases classified elsewhere with behavioral disturbance: Secondary | ICD-10-CM

## 2018-02-28 DIAGNOSIS — R69 Illness, unspecified: Secondary | ICD-10-CM | POA: Diagnosis not present

## 2018-02-28 DIAGNOSIS — G309 Alzheimer's disease, unspecified: Secondary | ICD-10-CM | POA: Diagnosis not present

## 2018-02-28 MED ORDER — LORAZEPAM 0.5 MG PO TABS: 0.5000 mg | ORAL_TABLET | Freq: Two times a day (BID) | ORAL | 5 refills | Status: DC | PRN

## 2018-02-28 NOTE — Progress Notes (Signed)
PATIENT: [name] DOB: 03-29-1941  REASON FOR VISIT: follow up HISTORY FROM: patient  HISTORY OF PRESENT ILLNESS: Today 02/28/18 [name] is a 77 year old female with a history of Alzheimer's disease she returns today for follow-up.  She continues to lives at home with her husband.  [facility].  Her husband reports that she gets very agitated while there. Xanax was prescribed but he reports that does not offer her much benefit.  He also reports that it does not last very long.  She requires assistance with all ADLs, he reports that her appetite is good.  He reports that she tends to sleep okay.  He reports that speech is garbled at times.  She is continued on Aricept and Namenda.  Patient returns today for evaluation.  HISTORY [name] is a 77 year old right-handed white female with a history of Alzheimer's disease.  The patient has continued to progress slowly with her memory.  She lives at home with her husband and she does go to the [facility] [employer] on occasion.  The patient is having some mild issues with agitation at times, not severe.  She does not wander from the house.  Sleeps well at night.  She eats well, she remains on Aricept and Namenda.  The patient is gradually losing her ability to verbalize.  She will have some incontinence at times when she has to go to the bathroom.  The patient requires some assistance with bathing and dressing.  There have been no hallucinations.  The patient no longer recognizes her reflection in the mirror, she thinks that it is a neighbor or friend and she will talk to herself in the mirror.  The patient cannot remember the name of her husband but she does know that her husband is her spouse.  The patient returns to this office for an evaluation.  REVIEW OF SYSTEMS: Out of a complete 14 system review of symptoms, the patient complains only of the following symptoms, and all other reviewed systems are  negative.  Back pain, agitation, behavior problems, confusion, nervous/anxious, hyperactive, memory loss, cold intolerance, chills, runny nose  ALLERGIES: Allergies  Allergen Reactions  . Asa [Aspirin] Nausea And Vomiting and Other (See Comments)    Bleeding ulcers    HOME MEDICATIONS: Outpatient Medications Prior to Visit  Medication Sig Dispense Refill  . acetaminophen (TYLENOL) 500 MG tablet Take 500 mg by mouth every 6 (six) hours as needed for mild pain.    Marland Kitchen alendronate (FOSAMAX) 70 MG tablet     . ALPRAZolam (XANAX) 0.5 MG tablet Take 1 tablet (0.5 mg total) by mouth 3 (three) times daily as needed for anxiety. 30 tablet 1  . calcium carbonate (OS-CAL - DOSED IN MG OF ELEMENTAL CALCIUM) 1250 (500 CA) MG tablet Take 1 tablet by mouth. With 1000 units vit D.    . cholecalciferol (VITAMIN D) 1000 units tablet Take 1,000 Units by mouth daily.    . cyanocobalamin 1000 MCG tablet Take 1,000 mcg by mouth daily. Every other week    . donepezil (ARICEPT) 10 MG tablet TAKE ONE TABLET BY MOUTH AT BEDTIME 90 tablet 3  . escitalopram (LEXAPRO) 20 MG tablet Take 1 tablet (20 mg total) by mouth daily. 30 tablet 3  . memantine (NAMENDA) 10 MG tablet TAKE 1 TABLET BY MOUTH TWICE DAILY 180 tablet 1  . methimazole (TAPAZOLE) 5 MG tablet Take 5 mg by mouth daily. Mon-Fri    .  pravastatin (PRAVACHOL) 10 MG tablet Take 1 tablet by mouth daily.     No facility-administered medications prior to visit.     PAST MEDICAL HISTORY: Past Medical History:  Diagnosis Date  . Arthritis   . Dementia (HCC)    Alzheimers  . Depression   . H/O tubal ligation   . Hx of cervical spine surgery   . Hyperthyroidism 06-2014  . Hypothyroidism   . Myocardial infarction (HCC)   . Osteoporosis   . Vitamin B12 deficiency     PAST SURGICAL HISTORY: Past Surgical History:  Procedure Laterality Date  . BREAST SURGERY     reduction  . bunionectomies    . FINGER ARTHROPLASTY  04/02/2012   Procedure: FINGER  ARTHROPLASTY;  Surgeon: Cliffton Asters. [name], MD;  Location: [facility];  Service: Orthopedics;  Laterality: Left;  Left Thumb Suspension Arthroplasty   . KYPHOPLASTY N/A 04/07/2016   Procedure: THORACIC 7 KYPHOPLASTY;  Surgeon: Estill Bamberg, MD;  Location: [facility] OR;  Service: Orthopedics;  Laterality: N/A;  THORACIC 7 KYPHOPLASTY  . REDUCTION MAMMAPLASTY    . TONSILLECTOMY    . TUBAL LIGATION      FAMILY HISTORY: Family History  Problem Relation Age of Onset  . Cancer Mother        mesothelioma  . Cancer Father        mesothelioma  . Dementia Paternal Grandmother     SOCIAL HISTORY: Social History   Socioeconomic History  . Marital status: Married    Spouse name: Weston Brass  . Number of children: 2  . Years of education: college  . Highest education level: Not on file  Occupational History  . Occupation: Retired  Engineer, production  . Financial resource strain: Not on file  . Food insecurity:    Worry: Not on file    Inability: Not on file  . Transportation needs:    Medical: Not on file    Non-medical: Not on file  Tobacco Use  . Smoking status: Former Smoker    Types: Cigarettes  . Smokeless tobacco: Never Used  Substance and Sexual Activity  . Alcohol use: No    Comment: rare  . Drug use: No  . Sexual activity: Not on file  Lifestyle  . Physical activity:    Days per week: Not on file    Minutes per session: Not on file  . Stress: Not on file  Relationships  . Social connections:    Talks on phone: Not on file    Gets together: Not on file    Attends religious service: Not on file    Active member of club or organization: Not on file    Attends meetings of clubs or organizations: Not on file    Relationship status: Not on file  . Intimate partner violence:    Fear of current or ex partner: Not on file    Emotionally abused: Not on file    Physically abused: Not on file    Forced sexual activity: Not on file  Other Topics Concern  . Not on file    Social History Narrative   Lives at home w/ her husband      PHYSICAL EXAM  Vitals:   02/28/18 0929  BP: 111/65  Pulse: 70  Weight: 168 lb (76.2 kg)  Height: 5\' 4"  (1.626 m)   Body mass index is 28.84 kg/m.   MMSE - Mini Mental State Exam 11/07/2016 05/09/2016 11/04/2015  Not completed: Unable to complete - -  Orientation to time 0 0 0  Orientation to Place - 0 0  Registration - 0 0  Attention/ Calculation - 0 0  Recall - 0 0  Language- name 2 objects - 1 2  Language- repeat - 0 0  Language- follow 3 step command - 2 3  Language- read & follow direction - 1 1  Write a sentence - 0 0  Copy design - 0 0  Total score - 4 6     Generalized: Well developed, in no acute distress   Neurological examination  Mentation: Alert  Follows commands intermittently. Cranial nerve II-XII: t. Extraocular movements were full, visual field were full on confrontational test. Facial sensation and strength were normal. Uvula tongue midline. Head turning and shoulder shrug  were normal and symmetric. Motor: The motor testing reveals 5 over 5 strength of all 4 extremities. Good symmetric motor tone is noted throughout.  Sensory: Sensory testing is intact to soft touch on all 4 extremities. No evidence of extinction is noted.  Coordination: Patient unable to complete finger-nose-finger,she has difficulty understanding directions. Gait and station: Gait is normal. Reflexes: Deep tendon reflexes are symmetric and normal bilaterally.   DIAGNOSTIC DATA (LABS, IMAGING, TESTING) - I reviewed patient records, labs, notes, testing and imaging myself where available.  Lab Results  Component Value Date   WBC 7.4 04/07/2016   HGB 12.8 04/07/2016   HCT 40.4 04/07/2016   MCV 86.9 04/07/2016   PLT 217 04/07/2016      Component Value Date/Time   NA 138 04/07/2016 1034   K 4.4 04/07/2016 1034   CL 107 04/07/2016 1034   CO2 24 04/07/2016 1034   GLUCOSE 99 04/07/2016 1034   BUN 14 04/07/2016 1034    CREATININE 0.66 04/07/2016 1034   CALCIUM 8.8 (L) 04/07/2016 1034   PROT 6.1 (L) 04/07/2016 1034   ALBUMIN 3.6 04/07/2016 1034   AST 18 04/07/2016 1034   ALT 13 (L) 04/07/2016 1034   ALKPHOS 102 04/07/2016 1034   BILITOT 0.6 04/07/2016 1034   GFRNONAA >60 04/07/2016 1034   GFRAA >60 04/07/2016 1034      ASSESSMENT AND PLAN 77 y.o. year old female  has a past medical history of Arthritis, Dementia (HCC), Depression, H/O tubal ligation, cervical spine surgery, Hyperthyroidism (06-2014), Hypothyroidism, Myocardial infarction (HCC), Osteoporosis, and Vitamin B12 deficiency. here with :  1.  Alzheimer's disease  I will switch patient from Xanax to Ativan.  I have given her 0.5 mg to take twice a day if needed for agitation and anxiety.  I have reviewed side effects of Ativan with the patient's husband and provided him with a handout.  Advised that it makes her too drowsy he should let us know.  She will stop Xanax.  Advised that we can wean off of Namenda decreasing the dose down to 1 tablet daily for 1 week then discontinue the medication.  She will remain on Aricept for now.  Advised that if her symptoms worsen or she develops new symptoms they should let us know.  She will follow-up in 6 months or sooner if needed.   I spent 15 minutes with the patient. 50% of this time was spent reviewing plan of care   Butch Penny, MSN, NP-C 02/28/2018, 9:51 AM [facility] [address], [address] [place], [place] [address] [phone]

## 2018-02-28 NOTE — Progress Notes (Signed)
I have read the note, and I agree with the clinical assessment and plan.  Andrea Colglazier K Ghadeer Kastelic   

## 2018-02-28 NOTE — Patient Instructions (Signed)
Your Plan:  Continue Aricept Wean off namenda- decrease 1 tablet daily for 1 week then stop Start Ativan 0.5 mg twice a day if needed. Start by taking once a day if well tolerating then can take it up to twice a day if needed of agitation  Stop xanax If your symptoms worsen or you develop new symptoms please let us know.   Thank you for coming to see [place] at [facility]. I hope we have been able to provide you high quality care today.  You may receive a patient satisfaction survey over the next few weeks. We would appreciate your feedback and comments so that we may continue to improve ourselves and the health of our patients.  Lorazepam tablets What is this medicine? LORAZEPAM (lor A ze pam) is a benzodiazepine. It is used to treat anxiety. This medicine may be used for other purposes; ask your health care provider or pharmacist if you have questions. COMMON BRAND NAME(S): Ativan What should I tell my health care provider before I take this medicine? They need to know if you have any of these conditions: -glaucoma -history of drug or alcohol abuse problem -kidney disease -liver disease -lung or breathing disease, like asthma -mental illness -myasthenia gravis -Parkinson's disease -suicidal thoughts, plans, or attempt; a previous suicide attempt by you or a family member -an unusual or allergic reaction to lorazepam, other medicines, foods, dyes, or preservatives -pregnant or trying to get pregnant -breast-feeding How should I use this medicine? Take this medicine by mouth with a glass of water. Follow the directions on the prescription label. Take your medicine at regular intervals. Do not take it more often than directed. Do not stop taking except on your doctor's advice. A special MedGuide will be given to you by the pharmacist with each prescription and refill. Be sure to read this information carefully each time. Talk to your pediatrician regarding the use of  this medicine in children. While this drug may be used in children as young as 12 years for selected conditions, precautions do apply. Overdosage: If you think you have taken too much of this medicine contact a poison control center or emergency room at once. NOTE: This medicine is only for you. Do not share this medicine with others. What if I miss a dose? If you miss a dose, take it as soon as you can. If it is almost time for your next dose, take only that dose. Do not take double or extra doses. What may interact with this medicine? Do not take this medicine with any of the following medications: -narcotic medicines for cough -sodium oxybate This medicine may also interact with the following medications: -alcohol -antihistamines for allergy, cough and cold -certain medicines for anxiety or sleep -certain medicines for depression, like amitriptyline, fluoxetine, sertraline -certain medicines for seizures like carbamazepine, phenobarbital, phenytoin, primidone -general anesthetics like lidocaine, pramoxine, tetracaine -MAOIs like Carbex, Eldepryl, Marplan, Nardil, and Parnate -medicines that relax muscles for surgery -narcotic medicines for pain -phenothiazines like chlorpromazine, mesoridazine, prochlorperazine, thioridazine This list may not describe all possible interactions. Give your health care provider a list of all the medicines, herbs, non-prescription drugs, or dietary supplements you use. Also tell them if you smoke, drink alcohol, or use illegal drugs. Some items may interact with your medicine. What should I watch for while using this medicine? Tell your doctor or health care professional if your symptoms do not start to get better or if they get worse. Do not stop taking  except on your doctor's advice. You may develop a severe reaction. Your doctor will tell you how much medicine to take. You may get drowsy or dizzy. Do not drive, use machinery, or do anything that needs  mental alertness until you know how this medicine affects you. To reduce the risk of dizzy and fainting spells, do not stand or sit up quickly, especially if you are an older patient. Alcohol may increase dizziness and drowsiness. Avoid alcoholic drinks. If you are taking another medicine that also causes drowsiness, you may have more side effects. Give your health care provider a list of all medicines you use. Your doctor will tell you how much medicine to take. Do not take more medicine than directed. Call emergency for help if you have problems breathing or unusual sleepiness. What side effects may I notice from receiving this medicine? Side effects that you should report to your doctor or health care professional as soon as possible: -allergic reactions like skin rash, itching or hives, swelling of the face, lips, or tongue -breathing problems -confusion -loss of balance or coordination -signs and symptoms of low blood pressure like dizziness; feeling faint or lightheaded, falls; unusually weak or tired -suicidal thoughts or other mood changes Side effects that usually do not require medical attention (report to your doctor or health care professional if they continue or are bothersome): -dizziness -headache -nausea, vomiting -tiredness This list may not describe all possible side effects. Call your doctor for medical advice about side effects. You may report side effects to FDA at 1-800-FDA-1088. Where should I keep my medicine? Keep out of the reach of children. This medicine can be abused. Keep your medicine in a safe place to protect it from theft. Do not share this medicine with anyone. Selling or giving away this medicine is dangerous and against the law. This medicine may cause accidental overdose and death if taken by other adults, children, or pets. Mix any unused medicine with a substance like cat litter or coffee grounds. Then throw the medicine away in a sealed container like a  sealed bag or a coffee can with a lid. Do not use the medicine after the expiration date. Store at room temperature between 20 and 25 degrees C (68 and 77 degrees F). Protect from light. Keep container tightly closed. NOTE: This sheet is a summary. It may not cover all possible information. If you have questions about this medicine, talk to your doctor, pharmacist, or health care provider.  2018 Elsevier/Gold Standard (2015-02-05 15:54:27)

## 2018-03-07 ENCOUNTER — Telehealth: Payer: Self-pay | Admitting: Adult Health

## 2018-03-07 NOTE — Telephone Encounter (Signed)
I called pt's husband, Janyth Pupa, per DPR. He reports that he has been giving her ativan 0.5mg  once a day. The facility is asking if they can give her both tablets at the same time instead. (1mg  once a day.) I advised pt's husband that I will check with Aundra Millet, NP and let him know.

## 2018-03-07 NOTE — Telephone Encounter (Signed)
Pt husband (on DPR-Flammer,Nicholas A) has called to inform that pt is tolerating the LORazepam (ATIVAN) 0.5 MG tablet well.  Husband states the day facility where pt is wants to know if her dosage can be doubled from the 0.5 mg, please call husband

## 2018-03-07 NOTE — Telephone Encounter (Signed)
I spoke to Parmele, NP. Pt may take ativan 1-2 0.5mg   tablets daily. If the facility feels that pt needs 2 tablets together in the AM, that is ok, but pt should not exceed 2 tablets per day.  I called pt's husband back and relayed this to him. I advised him to watch pt for grogginess and sedation if they do give pt a full 1mg  at one time. Pt's husband will watch out for this and verbalized understanding of these recommendations.

## 2018-03-09 NOTE — Telephone Encounter (Signed)
Faxed signed orders back to [facility] re: Lorazepam 0.5mg  tablet only as needed for anxiety while attending the memory care center. [name] allowed x2 daily ativan. Husband gives 1 tablet at 0700 at home." Fax: [phone]. Received fax confirmation.

## 2018-03-14 DIAGNOSIS — E0590 Thyrotoxicosis, unspecified without thyrotoxic crisis or storm: Secondary | ICD-10-CM | POA: Diagnosis not present

## 2018-03-15 DIAGNOSIS — R350 Frequency of micturition: Secondary | ICD-10-CM | POA: Diagnosis not present

## 2018-03-15 DIAGNOSIS — N390 Urinary tract infection, site not specified: Secondary | ICD-10-CM | POA: Diagnosis not present

## 2018-03-28 ENCOUNTER — Encounter: Payer: Medicare HMO | Admitting: Internal Medicine

## 2018-03-29 ENCOUNTER — Encounter: Payer: Self-pay | Admitting: Internal Medicine

## 2018-03-29 ENCOUNTER — Ambulatory Visit (INDEPENDENT_AMBULATORY_CARE_PROVIDER_SITE_OTHER): Payer: Medicare HMO | Admitting: Internal Medicine

## 2018-03-29 VITALS — BP 136/74 | HR 68 | Temp 97.4°F | Resp 10 | Ht 64.0 in | Wt 169.0 lb

## 2018-03-29 DIAGNOSIS — G300 Alzheimer's disease with early onset: Secondary | ICD-10-CM | POA: Diagnosis not present

## 2018-03-29 DIAGNOSIS — E0590 Thyrotoxicosis, unspecified without thyrotoxic crisis or storm: Secondary | ICD-10-CM | POA: Insufficient documentation

## 2018-03-29 DIAGNOSIS — R634 Abnormal weight loss: Secondary | ICD-10-CM | POA: Insufficient documentation

## 2018-03-29 DIAGNOSIS — R69 Illness, unspecified: Secondary | ICD-10-CM | POA: Diagnosis not present

## 2018-03-29 DIAGNOSIS — F02818 Dementia in other diseases classified elsewhere, unspecified severity, with other behavioral disturbance: Secondary | ICD-10-CM

## 2018-03-29 DIAGNOSIS — M810 Age-related osteoporosis without current pathological fracture: Secondary | ICD-10-CM | POA: Diagnosis not present

## 2018-03-29 DIAGNOSIS — F0281 Dementia in other diseases classified elsewhere with behavioral disturbance: Secondary | ICD-10-CM

## 2018-03-29 MED ORDER — CARBAMAZEPINE ER 100 MG PO TB12: 100.0000 mg | ORAL_TABLET | Freq: Every day | ORAL | 1 refills | Status: DC

## 2018-03-29 NOTE — Progress Notes (Signed)
Location:  [facility] clinic Provider: Burns Timson L. Renato Gails, D.O., C.M.D.  Goals of Care:  Advanced Directives 04/07/2016  Does Patient Have a Medical Advance Directive? Yes  Type of Estate agent of [place];Living will  Copy of Healthcare Power of Attorney in Chart? No - copy requested   Chief Complaint  Patient presents with  . Acute Visit    Behavior changes (overactive), frequent urination (patient went to UC to see if UTI and resulted negative) and weight loss. Here with husband   . Medication Management    Out of lexapro since Saturday, patient seems to be doing better. Discuss Ativan     HPI: Patient is a 77 y.o. 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  Stopped driving 1478.    We had tried the depakote and then increased.    They went back to neurology.  She got put back on lexapro.  She's now off it since Saturday.  She does better w/o it.  She is on ativan twice a day now.    She's not anxious as much as overactive.  She doesn't sit still.  She moves around and tries to get out the door.  Needs something to do all the time.  She will yell if her arm is grabbed.    She is also having more trips to the restroom--goes 5-6 times in 10 mins at times.  She did not have an infection with urine culture, but had abx based on UA.  Further testing could not be done at urology.    She lost a little weight--8 lbs in 1.5 mos.  She is eating.    Was unable to do eye exam yesterday.     She talks to the mirror or windows.  Will want the woman outside to eat also.    TSH was 0.0 at her PCP per her husband--this could be affecting her activity levels also.  Dose was just adjusted Past Medical History:  Diagnosis Date  . Arthritis   . Dementia (HCC)    Alzheimers  . Depression   . H/O tubal ligation   . Hx of cervical spine surgery   . Hyperthyroidism 06-2014  . Hypothyroidism   . Myocardial infarction (HCC)   . Osteoporosis   .  Vitamin B12 deficiency     Past Surgical History:  Procedure Laterality Date  . BREAST SURGERY     reduction  . bunionectomies    . FINGER ARTHROPLASTY  04/02/2012   Procedure: FINGER ARTHROPLASTY;  Surgeon: Cliffton Asters. [name], MD;  Location: [facility];  Service: Orthopedics;  Laterality: Left;  Left Thumb Suspension Arthroplasty   . KYPHOPLASTY N/A 04/07/2016   Procedure: THORACIC 7 KYPHOPLASTY;  Surgeon: Estill Bamberg, MD;  Location: [facility] OR;  Service: Orthopedics;  Laterality: N/A;  THORACIC 7 KYPHOPLASTY  . REDUCTION MAMMAPLASTY    . TONSILLECTOMY    . TUBAL LIGATION      Allergies  Allergen Reactions  . Asa [Aspirin] Nausea And Vomiting and Other (See Comments)    Bleeding ulcers    Outpatient Encounter Medications as of 03/29/2018  Medication Sig  . acetaminophen (TYLENOL) 500 MG tablet Take 500 mg by mouth every 6 (six) hours as needed for mild pain.  Marland Kitchen alendronate (FOSAMAX) 70 MG tablet   . calcium carbonate (OS-CAL - DOSED IN MG OF ELEMENTAL CALCIUM) 1250 (500 CA) MG tablet Take 1 tablet by mouth. With 1000  units vit D.  . cholecalciferol (VITAMIN D) 1000 units tablet Take 1,000 Units by mouth daily.  . cyanocobalamin 1000 MCG tablet Take 1,000 mcg by mouth daily. Every other week  . donepezil (ARICEPT) 10 MG tablet TAKE ONE TABLET BY MOUTH AT BEDTIME  . LORazepam (ATIVAN) 0.5 MG tablet Take 1 tablet (0.5 mg total) by mouth 2 (two) times daily as needed for anxiety.  . methimazole (TAPAZOLE) 5 MG tablet Take 5 mg by mouth daily. Mon-Fri  . pravastatin (PRAVACHOL) 10 MG tablet Take 1 tablet by mouth daily.  Marland Kitchen escitalopram (LEXAPRO) 20 MG tablet Take 1 tablet (20 mg total) by mouth daily. (Patient not taking: Reported on 03/29/2018)   No facility-administered encounter medications on file as of 03/29/2018.     Review of Systems:  Review of Systems  Constitutional: Negative for chills and fever.  HENT: Negative for congestion.   Eyes:       Glasses; could  not do full eye exam at ophtho as unable to follow commands  Respiratory: Negative for cough and shortness of breath.   Cardiovascular: Negative for chest pain, palpitations and leg swelling.  Gastrointestinal: Negative for abdominal pain, constipation, diarrhea and heartburn.  Genitourinary: Negative for dysuria.  Musculoskeletal: Positive for back pain. Negative for falls and joint pain.       Her husband gives her tylenol if she c/o pain or shows signs of distress  Neurological: Negative for dizziness and loss of consciousness.  Psychiatric/Behavioral: Positive for memory loss. Negative for depression. The patient is not nervous/anxious and does not have insomnia.     Health Maintenance  Topic Date Due  . TETANUS/TDAP  12/06/1961  . COLONOSCOPY  12/06/1992  . DEXA SCAN  12/07/2007  . PNA vac Low Risk Adult (1 of 2 - PCV13) 12/07/2007  . INFLUENZA VACCINE  Completed    Physical Exam: Vitals:   03/29/18 0913  BP: 136/74  Pulse: 68  Resp: 10  Temp: (!) 97.4 F (36.3 C)  TempSrc: Oral  SpO2: 97%  Weight: 169 lb (76.7 kg)  Height: 5\' 4"  (1.626 m)   Body mass index is 29.01 kg/m. Physical Exam  Constitutional: She appears well-developed and well-nourished. No distress.  HENT:  Head: Normocephalic and atraumatic.  Eyes:  glasses  Cardiovascular: Normal rate, regular rhythm, normal heart sounds and intact distal pulses.  Pulmonary/Chest: Effort normal and breath sounds normal. No respiratory distress.  Abdominal: Bowel sounds are normal. She exhibits no distension. There is no tenderness.  Musculoskeletal: Normal range of motion.  Ambulates without assistance  Neurological: She is alert.  Skin: Capillary refill takes less than 2 seconds.  Psychiatric: She has a normal mood and affect.    Labs reviewed: Basic Metabolic Panel: No results for input(s): NA, K, CL, CO2, GLUCOSE, BUN, CREATININE, CALCIUM, MG, PHOS, TSH in the last 8760 hours. Liver Function Tests: No  results for input(s): AST, ALT, ALKPHOS, BILITOT, PROT, ALBUMIN in the last 8760 hours. No results for input(s): LIPASE, AMYLASE in the last 8760 hours. No results for input(s): AMMONIA in the last 8760 hours. CBC: No results for input(s): WBC, NEUTROABS, HGB, HCT, MCV, PLT in the last 8760 hours. Lipid Panel: No results for input(s): CHOL, HDL, LDLCALC, TRIG, CHOLHDL, LDLDIRECT in the last 8760 hours. No results found for: HGBA1C  Assessment/Plan 1. 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  Dispense: 30 tablet; Refill: 1  2. Hyperthyroidism, unspecified type - last tsh very low and methimazole had to be adjusted to daily from just a few days per week to address this  3. Weight loss -see hpi -pt walks around the majority of the day so suspect it's due to this and her dementia itself, continues to eat well, her husband reports that her screening tests have been normal like mammogram, cscope -will obtain records from Dr. Kateri Plummer at [place] as they plan to transfer here for primary care for [facility]  4. Postmenopausal osteoporosis -is on fosamax weekly, oscal and vitamin D3  They plan to switch over primary care to [place].    Labs/tests ordered:  No new as not certain what's been done recently by Dr. Kateri Plummer  Next appt:  05/14/2018 for f/u on behavior, weight  Radwan Cowley L. Swathi Dauphin, D.O. Geriatrics [facility] [address], [place] [address] Cell Phone (Mon-Fri 8am-5pm):  [phone] On Call:  [phone] & follow prompts after 5pm &  weekends Office Phone:  [phone] Office Fax:  [phone]

## 2018-03-29 NOTE — Patient Instructions (Addendum)
Let's try tegretol just once a day before she goes to the center.   Continue as needed ativan.   Stay off the lexapro since she seemed worse on it.   Some of this may improve as her thyroid gets back in line.

## 2018-04-09 DIAGNOSIS — G309 Alzheimer's disease, unspecified: Secondary | ICD-10-CM | POA: Diagnosis not present

## 2018-04-09 DIAGNOSIS — E039 Hypothyroidism, unspecified: Secondary | ICD-10-CM | POA: Diagnosis not present

## 2018-04-09 DIAGNOSIS — R350 Frequency of micturition: Secondary | ICD-10-CM | POA: Diagnosis not present

## 2018-04-09 DIAGNOSIS — R634 Abnormal weight loss: Secondary | ICD-10-CM | POA: Diagnosis not present

## 2018-04-09 LAB — BASIC METABOLIC PANEL
BUN: 14 (ref 4–21)
Creatinine: 0.8 (ref <=1.1)
GLUCOSE: 104
Potassium: 3.8 (ref 3.4–5.3)
Sodium: 143 (ref 137–147)

## 2018-04-09 LAB — TSH: TSH: 0.41 (ref <=5.90)

## 2018-04-25 DIAGNOSIS — R69 Illness, unspecified: Secondary | ICD-10-CM | POA: Diagnosis not present

## 2018-05-07 DIAGNOSIS — Z111 Encounter for screening for respiratory tuberculosis: Secondary | ICD-10-CM | POA: Diagnosis not present

## 2018-05-14 ENCOUNTER — Encounter: Payer: Self-pay | Admitting: Internal Medicine

## 2018-05-14 ENCOUNTER — Ambulatory Visit (INDEPENDENT_AMBULATORY_CARE_PROVIDER_SITE_OTHER): Payer: Medicare HMO | Admitting: Internal Medicine

## 2018-05-14 VITALS — BP 128/70 | Ht 64.0 in | Wt 161.0 lb

## 2018-05-14 DIAGNOSIS — R634 Abnormal weight loss: Secondary | ICD-10-CM | POA: Diagnosis not present

## 2018-05-14 DIAGNOSIS — E7800 Pure hypercholesterolemia, unspecified: Secondary | ICD-10-CM

## 2018-05-14 DIAGNOSIS — D518 Other vitamin B12 deficiency anemias: Secondary | ICD-10-CM

## 2018-05-14 DIAGNOSIS — G300 Alzheimer's disease with early onset: Secondary | ICD-10-CM | POA: Diagnosis not present

## 2018-05-14 DIAGNOSIS — M810 Age-related osteoporosis without current pathological fracture: Secondary | ICD-10-CM | POA: Diagnosis not present

## 2018-05-14 DIAGNOSIS — R69 Illness, unspecified: Secondary | ICD-10-CM | POA: Diagnosis not present

## 2018-05-14 DIAGNOSIS — F02818 Dementia in other diseases classified elsewhere, unspecified severity, with other behavioral disturbance: Secondary | ICD-10-CM

## 2018-05-14 DIAGNOSIS — E0590 Thyrotoxicosis, unspecified without thyrotoxic crisis or storm: Secondary | ICD-10-CM

## 2018-05-14 DIAGNOSIS — F0281 Dementia in other diseases classified elsewhere with behavioral disturbance: Secondary | ICD-10-CM

## 2018-05-14 NOTE — Patient Instructions (Signed)
Ok to take tegretol twice a day--before going to the center and when she gets back home.    [facility]  Let us know if you need any paperwork completed here for her placement.

## 2018-05-14 NOTE — Progress Notes (Signed)
Provider:  Gwenith Spitziffany L. Renato Gailseed, D.O., C.M.D. Location:   [facility]  Place of Service:   clinic  Previous PCP: Kermit Baloeed, Lyly Canizales L, DO Patient Care Team: Kermit Baloeed, Rachna Schonberger L, DO as PCP - General (Geriatric Medicine)  Extended Emergency Contact Information Primary Emergency Contact: Borum,Nicholas A Address: [address]          [place] [id number] [place] Home Phone: [phone] Mobile Phone: [phone] Relation: Spouse  Code Status: DNR Goals of Care: Advanced Directive information Advanced Directives 04/07/2016  Does Patient Have a Medical Advance Directive? Yes  Type of Estate agentAdvance Directive Healthcare Power of [place]ren[place]Attorney;Living will  Copy of Healthcare Power of Attorney in Chart? No - copy requested      Chief Complaint  Patient presents with  . Acute Visit    [facility]    HPI: Patient is a 77 y.o. female seen today for f/u on dementia.    Last visit, her husband had decided to shift her care from Dr. Kateri PlummerMorrow to us for primary care.    We had started her on tegretol last visit.  Her husband notes it may wear off by 2pm, she wanders around.  She gets sleepy at 7pm, he keeps her awake until 9pm.  Due to lack of benefit and need to decrease pill burden, she is off her aricept and namenda now and no longer getting the ativan prn.  She is off calcium.    [facility].  He feels guilty as hell doing it.   He says she has times where she does really well.  [facility] told him she needs it sometimes.   She rarely gets lorazepam there.    She remains on vitamin D and fosamax for osteoporosis.  She is on pravachol for cholesterol.    She takes methimazole for hyperthyroidism.    She's on B12 for deficiency.    She has tylenol as needed for arthritis pains.  Weight is 161 lbs which is down 8-9 lbs in the past month or so, but now she's off aricept which may have contributed to this. She is  not eating as much.  She continues to wander around a lot.    She had a CPE with labs 11/18 at Dr. Kateri PlummerMorrow apparently after all.  Labs were all ok per her husband.   She had a cscope 5-6 years ago.     Cannot do dental or eye visits now.    Her back may hurt once in a while, but no other pain concerns.    She is struggling more with him during bathing and bedtime routine.  She's with him all the time.    She will move after 1/1.    Past Medical History:  Diagnosis Date  . Arthritis   . Dementia (HCC)    Alzheimers  . Depression   . H/O tubal ligation   . Hx of cervical spine surgery   . Hyperthyroidism 06-2014  . Hypothyroidism   . Myocardial infarction (HCC)   . Osteoporosis   . Vitamin B12 deficiency    Past Surgical History:  Procedure Laterality Date  . BREAST SURGERY     reduction  . bunionectomies    . FINGER ARTHROPLASTY  04/02/2012   Procedure: FINGER ARTHROPLASTY;  Surgeon: Cliffton Astersavid A. Janee Mornhompson, MD;  [facility];  Service: Orthopedics;  Laterality: Left;  Left Thumb Suspension Arthroplasty   .  KYPHOPLASTY N/A 04/07/2016   Procedure: THORACIC 7 KYPHOPLASTY;  Surgeon: Estill BambergMark Dumonski, MD;  Location: [facility];  Service: Orthopedics;  Laterality: N/A;  THORACIC 7 KYPHOPLASTY  . REDUCTION MAMMAPLASTY    . TONSILLECTOMY    . TUBAL LIGATION      reports that she quit smoking about 40 years ago. Her smoking use included cigarettes. She has never used smokeless tobacco. She reports current alcohol use. She reports that she does not use drugs.  Functional Status Survey:    Family History  Problem Relation Age of Onset  . Cancer Mother        mesothelioma  . Cancer Father        mesothelioma  . Dementia Paternal Grandmother     Health Maintenance  Topic Date Due  . Janet BerlinETANUS/TDAP  12/06/1961  . COLONOSCOPY  12/06/1992  . DEXA SCAN  12/07/2007  . PNA vac Low Risk Adult (1 of 2 - PCV13) 12/07/2007  . INFLUENZA VACCINE  Completed    Allergies    Allergen Reactions  . Asa [Aspirin] Nausea And Vomiting and Other (See Comments)    Bleeding ulcers    Outpatient Encounter Medications as of 05/14/2018  Medication Sig  . acetaminophen (TYLENOL) 500 MG tablet Take 500 mg by mouth every 6 (six) hours as needed for mild pain.  Marland Kitchen. alendronate (FOSAMAX) 70 MG tablet   . carbamazepine (TEGRETOL-XR) 100 MG 12 hr tablet Take 1 tablet (100 mg total) by mouth daily.  . cholecalciferol (VITAMIN D) 1000 units tablet Take 1,000 Units by mouth daily.  . cyanocobalamin 1000 MCG tablet Take 1,000 mcg by mouth daily. Every other week  . methimazole (TAPAZOLE) 5 MG tablet Take 5 mg by mouth daily. Mon-Fri  . pravastatin (PRAVACHOL) 10 MG tablet Take 1 tablet by mouth daily.  . [DISCONTINUED] calcium carbonate (OS-CAL - DOSED IN MG OF ELEMENTAL CALCIUM) 1250 (500 CA) MG tablet Take 1 tablet by mouth. With 1000 units vit D.  . [DISCONTINUED] donepezil (ARICEPT) 10 MG tablet TAKE ONE TABLET BY MOUTH AT BEDTIME  . [DISCONTINUED] LORazepam (ATIVAN) 0.5 MG tablet Take 1 tablet (0.5 mg total) by mouth 2 (two) times daily as needed for anxiety.   No facility-administered encounter medications on file as of 05/14/2018.     Review of Systems  Constitutional: Positive for weight loss. Negative for chills, fever and malaise/fatigue.  HENT: Negative for hearing loss.        Cerumen left ear  Eyes: Negative for blurred vision.  Respiratory: Negative for cough and shortness of breath.   Cardiovascular: Negative for chest pain, palpitations and leg swelling.  Gastrointestinal: Negative for abdominal pain, blood in stool, constipation and melena.  Genitourinary: Negative for dysuria.  Musculoskeletal: Positive for back pain. Negative for falls and joint pain.  Skin: Negative for itching and rash.  Neurological: Negative for dizziness and loss of consciousness.  Psychiatric/Behavioral: Positive for hallucinations and memory loss. Negative for depression. The patient  is not nervous/anxious and does not have insomnia.     Vitals:   05/14/18 0903  BP: 128/70  Weight: 161 lb (73 kg)  Height: 5\' 4"  (1.626 m)   Body mass index is 27.64 kg/m. Physical Exam Vitals signs reviewed.  Constitutional:      Appearance: Normal appearance.  HENT:     Head: Normocephalic and atraumatic.  Cardiovascular:     Rate and Rhythm: Normal rate and regular rhythm.     Pulses: Normal pulses.     Heart sounds:  Normal heart sounds.  Pulmonary:     Effort: Pulmonary effort is normal.     Breath sounds: Normal breath sounds.  Abdominal:     General: Bowel sounds are normal. There is no distension.     Palpations: Abdomen is soft. There is no mass.     Tenderness: There is no abdominal tenderness.  Musculoskeletal: Normal range of motion.        General: Tenderness present.     Comments: Over back, but yells out when she is touched most anywhere  Skin:    General: Skin is warm and dry.     Capillary Refill: Capillary refill takes less than 2 seconds.  Neurological:     General: No focal deficit present.     Mental Status: She is alert.  Psychiatric:     Comments: Irritable at first and pointing her finger at me at one point, then later very friendly, smiling and laughing after "picking up things off the floor we do not see and putting them on the counter"    Labs reviewed:  Mr. Peterkin showed me her cbc, cmp from Dr. Kateri Plummer which was normal  Basic Metabolic Panel: No results for input(s): NA, K, CL, CO2, GLUCOSE, BUN, CREATININE, CALCIUM, MG, PHOS in the last 8760 hours. Liver Function Tests: No results for input(s): AST, ALT, ALKPHOS, BILITOT, PROT, ALBUMIN in the last 8760 hours. No results for input(s): LIPASE, AMYLASE in the last 8760 hours. No results for input(s): AMMONIA in the last 8760 hours. CBC: No results for input(s): WBC, NEUTROABS, HGB, HCT, MCV, PLT in the last 8760 hours. Cardiac Enzymes: No results for input(s): CKTOTAL, CKMB, CKMBINDEX,  TROPONINI in the last 8760 hours. BNP: Invalid input(s): POCBNP No results found for: HGBA1C No results found for: TSH No results found for: VITAMINB12 No results found for: FOLATE No results found for: IRON, TIBC, FERRITIN  Imaging and Procedures Recently: Reportedly had cscope 4-5 yrs ago that was normal and next was to be 10 years--discussed pt would not tolerate this at this time and would not recommend   Assessment/Plan 1. 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 Postmenopausal osteoporosis -cont fosamax and vitamin D therapy  3. Hyperthyroidism -cont methimazole therapy -tsh was normal at PCP  4. Weight loss -suspect related to her dementia rather than a malignancy and advised about what to look out for for other causes -advised I would not recommend screening tests with her advanced dementia  5. Other vitamin B12 deficiency anemia -cont b12 supplement  6. Pure hypercholesterolemia -remains on pravachol--may discuss stopping at her next visit  F/u 2 mos for med mgt  Amirr Achord L. Xiomara Sevillano, D.O. Geriatrics [facility] [address], [place] [address] Cell Phone (Mon-Fri 8am-5pm):  [phone] On Call:  [phone] & follow prompts after 5pm & weekends Office Phone:  [phone] Office Fax:  [phone]

## 2018-05-15 ENCOUNTER — Other Ambulatory Visit: Payer: Self-pay | Admitting: Internal Medicine

## 2018-05-15 DIAGNOSIS — F0281 Dementia in other diseases classified elsewhere with behavioral disturbance: Secondary | ICD-10-CM

## 2018-05-15 DIAGNOSIS — G300 Alzheimer's disease with early onset: Secondary | ICD-10-CM

## 2018-06-02 DIAGNOSIS — S7011XA Contusion of right thigh, initial encounter: Secondary | ICD-10-CM | POA: Diagnosis not present

## 2018-06-15 ENCOUNTER — Ambulatory Visit (INDEPENDENT_AMBULATORY_CARE_PROVIDER_SITE_OTHER): Payer: Medicare HMO | Admitting: Family

## 2018-06-15 ENCOUNTER — Encounter: Payer: Self-pay | Admitting: Family

## 2018-06-15 VITALS — BP 110/80 | HR 76 | Ht 60.0 in | Wt 154.0 lb

## 2018-06-15 DIAGNOSIS — F0281 Dementia in other diseases classified elsewhere with behavioral disturbance: Secondary | ICD-10-CM

## 2018-06-15 DIAGNOSIS — M810 Age-related osteoporosis without current pathological fracture: Secondary | ICD-10-CM

## 2018-06-15 DIAGNOSIS — E7800 Pure hypercholesterolemia, unspecified: Secondary | ICD-10-CM

## 2018-06-15 DIAGNOSIS — R634 Abnormal weight loss: Secondary | ICD-10-CM

## 2018-06-15 DIAGNOSIS — F02818 Dementia in other diseases classified elsewhere, unspecified severity, with other behavioral disturbance: Secondary | ICD-10-CM

## 2018-06-15 DIAGNOSIS — G300 Alzheimer's disease with early onset: Secondary | ICD-10-CM | POA: Diagnosis not present

## 2018-06-15 DIAGNOSIS — R69 Illness, unspecified: Secondary | ICD-10-CM | POA: Diagnosis not present

## 2018-06-15 MED ORDER — SERTRALINE HCL 25 MG PO TABS: 25.0000 mg | ORAL_TABLET | Freq: Every day | ORAL | 0 refills | Status: DC

## 2018-06-15 NOTE — Progress Notes (Signed)
Provider: Azaria Bartell FNP-C  Kermit Baloeed, Tiffany L, DO  Patient Care Team: Kermit Baloeed, Tiffany L, DO as PCP - General (Geriatric Medicine)  Extended Emergency Contact Information Primary Emergency Contact: Benthall,Nicholas A Address: [id number]          [place] [id number] [place] Home Phone: [phone] Mobile Phone: [phone] Relation: Spouse  Goals of care: Advanced Directive information Advanced Directives 06/15/2018  Does Patient Have a Medical Advance Directive? No  Type of Advance Directive -  Copy of Healthcare Power of Attorney in Chart? -  Would patient like information on creating a medical advance directive? No - Patient declined     Chief Complaint  Patient presents with  . Acute Visit    Concerns about medication, scratching a lot, and weight loss due to not eating    HPI:  Pt is a 78 y.o. female seen today for an acute visit for evaluation of weight loss,worsening dementia behaviors and concerns about medication.she is here escorted by husband today.He states [facility] but [facility] to place her in dementia unit in one month.He states patient has had worsening agitation.she pulls on her fingers,scratches self like she is itchy.she continues to reach down on the floor and picks up stuff on the floor that are not visible.she gets Ativan twice daily at the day as needed for anxiety.she has been getting at least once daily   Weight loss -she has had a 15 pounds loss over 2 months.Spouse states patient refuses to eat meals but will drink protein supplements. On chart review latest TSH level was 0.0 with previous PCP and her methimazole was adjusted.He states patient has another recent TSH level done.He will call [facility] with labs or fax.  Hypercholesterolemia - on pravastatin 10 mg tablet.POA would like medication discontinued states patient spitting medication. Refuses to swallow them sometimes.   Postmenopausal Osteoporosis- on  fosamax 70 mg tablet weekly but refuses to swallow medication.  Past Medical History:  Diagnosis Date  . Arthritis   . Dementia (HCC)    Alzheimers  . Depression   . H/O tubal ligation   . Hx of cervical spine surgery   . Hyperthyroidism 06-2014  . Hypothyroidism   . Myocardial infarction (HCC)   . Osteoporosis   . Vitamin B12 deficiency    Past Surgical History:  Procedure Laterality Date  . BREAST SURGERY     reduction  . bunionectomies    . FINGER ARTHROPLASTY  04/02/2012   Procedure: FINGER ARTHROPLASTY;  Surgeon: Cliffton Astersavid A. Janee Mornhompson, MD;  [facility];  Service: Orthopedics;  Laterality: Left;  Left Thumb Suspension Arthroplasty   . KYPHOPLASTY N/A 04/07/2016   Procedure: THORACIC 7 KYPHOPLASTY;  Surgeon: Estill BambergMark Dumonski, MD;  Location: [facility];  Service: Orthopedics;  Laterality: N/A;  THORACIC 7 KYPHOPLASTY  . REDUCTION MAMMAPLASTY    . TONSILLECTOMY    . TUBAL LIGATION      Allergies  Allergen Reactions  . Asa [Aspirin] Nausea And Vomiting and Other (See Comments)    Bleeding ulcers    Outpatient Encounter Medications as of 06/15/2018  Medication Sig  . acetaminophen (TYLENOL) 500 MG tablet Take 500 mg by mouth every 6 (six) hours as needed for mild pain.  Marland Kitchen. alendronate (FOSAMAX) 70 MG tablet Take 70 mg by mouth once a week.   . carbamazepine (TEGRETOL XR) 100 MG 12 hr tablet TAKE 1 TABLET BY MOUTH ONCE DAILY  . methimazole (TAPAZOLE) 5 MG tablet  Take 5 mg by mouth daily. Mon-Fri  . pravastatin (PRAVACHOL) 10 MG tablet Take 1 tablet by mouth daily.  . sertraline (ZOLOFT) 25 MG tablet Take 1 tablet (25 mg total) by mouth daily.  . [DISCONTINUED] cholecalciferol (VITAMIN D) 1000 units tablet Take 1,000 Units by mouth daily.  . [DISCONTINUED] cyanocobalamin 1000 MCG tablet Take 1,000 mcg by mouth daily. Every other week   No facility-administered encounter medications on file as of 06/15/2018.     Review of Systems  Unable to perform ROS:  Dementia (additional information provided by patient's husband )    Immunization History  Administered Date(s) Administered  . Influenza, High Dose Seasonal PF 02/03/2018  . Influenza-Unspecified 02/14/2014   Pertinent  Health Maintenance Due  Topic Date Due  . COLONOSCOPY  12/06/1992  . DEXA SCAN  12/07/2007  . PNA vac Low Risk Adult (1 of 2 - PCV13) 12/07/2007  . INFLUENZA VACCINE  Completed   Fall Risk  06/15/2018 03/29/2018 11/07/2016  Falls in the past year? 0 0 No  Number falls in past yr: 0 0 -  Injury with Fall? 0 1 -    Vitals:   06/15/18 0910  BP: 110/80  Pulse: 76  SpO2: 98%  Weight: 154 lb (69.9 kg)  Height: 5' (1.524 m)   Body mass index is 30.08 kg/m. Physical Exam Constitutional:      Appearance: She is obese.  HENT:     Head: Normocephalic.     Ears:     Comments: Unable to exam due to agitation     Nose: No rhinorrhea.     Mouth/Throat:     Mouth: Mucous membranes are moist.     Pharynx: Oropharynx is clear. No oropharyngeal exudate or posterior oropharyngeal erythema.  Eyes:     General: No scleral icterus.       Right eye: No discharge.        Left eye: No discharge.     Conjunctiva/sclera: Conjunctivae normal.     Pupils: Pupils are equal, round, and reactive to light.  Neck:     Musculoskeletal: Normal range of motion. No muscular tenderness.  Cardiovascular:     Rate and Rhythm: Normal rate and regular rhythm.     Pulses: Normal pulses.     Heart sounds: Normal heart sounds. No murmur. No friction rub. No gallop.   Pulmonary:     Effort: Pulmonary effort is normal. No respiratory distress.     Breath sounds: Normal breath sounds. No wheezing, rhonchi or rales.  Chest:     Chest wall: No tenderness.  Abdominal:     General: Bowel sounds are normal. There is no distension.     Palpations: Abdomen is soft. There is no mass.     Tenderness: There is no abdominal tenderness. There is no right CVA tenderness, left CVA tenderness, guarding or  rebound.  Musculoskeletal: Normal range of motion.        General: No tenderness.     Right lower leg: No edema.     Left lower leg: No edema.  Lymphadenopathy:     Cervical: No cervical adenopathy.  Skin:    General: Skin is warm and dry.     Coloration: Skin is not pale.     Findings: No erythema, lesion or rash.  Neurological:     Mental Status: She is alert. She is confused.     Motor: No weakness.     Gait: Gait is intact.  Comments: Agitated and unco-operative   Psychiatric:        Attention and Perception: She perceives visual hallucinations.        Mood and Affect: Mood normal.        Speech: Speech normal.        Behavior: Behavior is uncooperative and agitated.        Cognition and Memory: Cognition is impaired. Memory is impaired.     Comments: Picking up stuff from floor and chair that are not visible.      Labs reviewed: No results for input(s): NA, K, CL, CO2, GLUCOSE, BUN, CREATININE, CALCIUM, MG, PHOS in the last 8760 hours. No results for input(s): AST, ALT, ALKPHOS, BILITOT, PROT, ALBUMIN in the last 8760 hours. No results for input(s): WBC, NEUTROABS, HGB, HCT, MCV, PLT in the last 8760 hours. No results found for: TSH No results found for: HGBA1C No results found for: CHOL, HDL, LDLCALC, LDLDIRECT, TRIG, CHOLHDL  Significant Diagnostic Results in last 30 days:  No results found.  Assessment/Plan 1. 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 Follow up in 4 weeks to re-evaluate.      2. Weight  loss Refuses meals but drinks protein supplements.POA reports [facility].I've requested for [facility] to [facility] her weight loss could be due to her thyroid too.Will follow up.continue current protein supplements.    3. Pure hypercholesterolemia On pravastatin but refuses medication.discontinue pravastatin to reduce medication burden since patient refuses meds.   4. Postmenopausal osteoporosis On Fosamax but refuses.discontinue Fosamax  Family/ staff Communication: Reviewed plan of care with patient and Husband.  Labs/tests ordered: None.POA to send TSH lab from previous provider. Next appointment: 4 weeks to evaluate agitation and effects of sertraline.    Caesar Bookmaninah C Jaylyn Iyer, NP

## 2018-06-15 NOTE — Patient Instructions (Signed)
1. Discontinue Fosamax and Pravastatin to reduce medication burden. 2. Start sertraline 25 mg tablet one by mouth daily for agitation 3. Follow up in 4 weeks to evaluate agitation

## 2018-06-18 ENCOUNTER — Telehealth: Payer: Self-pay | Admitting: *Deleted

## 2018-06-18 NOTE — Telephone Encounter (Signed)
Patient caregiver notified and agreed. Medication list updated.

## 2018-06-18 NOTE — Telephone Encounter (Signed)
Patient caregiver called and stated that patient was seen on Friday by Dinah. They had a question regarding her Lorazepam, stated that it was not on current medication list and they was wondering if she should still be taking it. Stated that patient was taking it twice daily. Please Advise.

## 2018-06-20 ENCOUNTER — Other Ambulatory Visit: Payer: Self-pay | Admitting: Internal Medicine

## 2018-06-20 ENCOUNTER — Telehealth: Payer: Self-pay

## 2018-06-20 NOTE — Telephone Encounter (Signed)
Stop the lorazepam.  Continue the others unless her symptoms have been going on before this episode.  The lorazepam is the one that was newly added back to her regimen after the phone call just 2 days ago. If she develops new symptoms, I'd like to see her and check her.  He should monitor for new weakness or numbness, speech changes, or visual changes (as best he can assess these with her dementia).

## 2018-06-20 NOTE — Telephone Encounter (Signed)
[name] called stating patient is foggy headed, dizzy, and fell backwards today (no visible injuries). [name] thinks patient needs to d/c all medications except thyroid medication. [name] questions if medications are causing above symptoms.   Please advise

## 2018-06-20 NOTE — Progress Notes (Signed)
Stop the lorazepam.  Continue the others.  The lorazepam is the one that was newly added back to her regimen after the phone call just 2 days ago.

## 2018-06-21 NOTE — Telephone Encounter (Signed)
Ok to stop all meds except methimazole.

## 2018-06-21 NOTE — Telephone Encounter (Signed)
.  left message to have patient return my call.  

## 2018-06-21 NOTE — Telephone Encounter (Signed)
Called [name], someone answered and the connection was lost. I will try again in a few min

## 2018-06-21 NOTE — Telephone Encounter (Signed)
Spoke with Nicholas,symptoms going on before this episode. Patient seems "Zombied out" and patient is hallucinating. Janyth Pupa states he would like to d/c all medications x 1 week to see what happens, if Dr.reed is ok with that. Janyth Pupa stated patient is only taking medications for mood with the exception of Methimazole for thyroid.  I reviewed medication list and inform Janyth Pupa that patient is taking a medication for her bones and cholesterol as well. Janyth Pupa informed me that Carilyn Goodpasture stopped pravastatin and fosamax (not removed from medication list). I updated medication list to reflect med changes. Janyth Pupa aware I will call him with Dr.Reed's response.

## 2018-06-22 NOTE — Telephone Encounter (Signed)
Ok we can begin to fill in an [facility] for him and I will finish it on Monday.

## 2018-06-22 NOTE — Telephone Encounter (Signed)
Spoke with husband and advised results, also husband is asking for a FL-2 form, pt moving to [employer]

## 2018-06-26 ENCOUNTER — Telehealth: Payer: Self-pay

## 2018-06-26 NOTE — Telephone Encounter (Signed)
Patient's husband called to request a fl2 form be filled out for nursing facility. I informed him that he may need an appointment. He requested to just have a form filled out and if he could just pick up from office. Please advise.

## 2018-06-26 NOTE — Telephone Encounter (Signed)
Called patient's husband and let him know that form would be available for pick up on Thursday. He verbalized understanding and had no further questions.

## 2018-06-28 NOTE — Telephone Encounter (Signed)
If this is urgent, I recommend we have someone else finish it today so it can be sent off.  Otherwise, hopefully I will be there tomorrow to complete it.

## 2018-06-28 NOTE — Telephone Encounter (Signed)
Fl-2 in your folder to be signed, pt came by today.

## 2018-07-02 NOTE — Telephone Encounter (Addendum)
Spoke with patient's husband and advised results. Form ready for pick-up

## 2018-07-16 ENCOUNTER — Ambulatory Visit: Payer: Medicare HMO | Admitting: Family

## 2018-07-16 ENCOUNTER — Encounter (HOSPITAL_COMMUNITY): Payer: Self-pay | Admitting: *Deleted

## 2018-07-16 ENCOUNTER — Emergency Department (HOSPITAL_COMMUNITY)
Admission: EM | Admit: 2018-07-16 | Discharge: 2018-07-16 | Disposition: A | Discharge status: Home / Self Care | Payer: Medicare HMO | Attending: Emergency Medicine | Admitting: Emergency Medicine

## 2018-07-16 DIAGNOSIS — N390 Urinary tract infection, site not specified: Secondary | ICD-10-CM | POA: Insufficient documentation

## 2018-07-16 DIAGNOSIS — Z9183 Wandering in diseases classified elsewhere: Secondary | ICD-10-CM | POA: Diagnosis not present

## 2018-07-16 DIAGNOSIS — E039 Hypothyroidism, unspecified: Secondary | ICD-10-CM | POA: Diagnosis not present

## 2018-07-16 DIAGNOSIS — D519 Vitamin B12 deficiency anemia, unspecified: Secondary | ICD-10-CM | POA: Diagnosis not present

## 2018-07-16 DIAGNOSIS — E0590 Thyrotoxicosis, unspecified without thyrotoxic crisis or storm: Secondary | ICD-10-CM | POA: Diagnosis not present

## 2018-07-16 DIAGNOSIS — Z79899 Other long term (current) drug therapy: Secondary | ICD-10-CM | POA: Diagnosis not present

## 2018-07-16 DIAGNOSIS — Z87891 Personal history of nicotine dependence: Secondary | ICD-10-CM | POA: Diagnosis not present

## 2018-07-16 DIAGNOSIS — R451 Restlessness and agitation: Secondary | ICD-10-CM | POA: Diagnosis present

## 2018-07-16 DIAGNOSIS — R456 Violent behavior: Secondary | ICD-10-CM | POA: Diagnosis not present

## 2018-07-16 DIAGNOSIS — R634 Abnormal weight loss: Secondary | ICD-10-CM | POA: Diagnosis not present

## 2018-07-16 DIAGNOSIS — I10 Essential (primary) hypertension: Secondary | ICD-10-CM | POA: Insufficient documentation

## 2018-07-16 DIAGNOSIS — F0281 Dementia in other diseases classified elsewhere with behavioral disturbance: Secondary | ICD-10-CM | POA: Diagnosis not present

## 2018-07-16 DIAGNOSIS — G309 Alzheimer's disease, unspecified: Secondary | ICD-10-CM | POA: Diagnosis not present

## 2018-07-16 DIAGNOSIS — R69 Illness, unspecified: Secondary | ICD-10-CM | POA: Diagnosis not present

## 2018-07-16 DIAGNOSIS — Z046 Encounter for general psychiatric examination, requested by authority: Secondary | ICD-10-CM | POA: Diagnosis not present

## 2018-07-16 DIAGNOSIS — E7800 Pure hypercholesterolemia, unspecified: Secondary | ICD-10-CM | POA: Diagnosis not present

## 2018-07-16 DIAGNOSIS — M810 Age-related osteoporosis without current pathological fracture: Secondary | ICD-10-CM | POA: Diagnosis not present

## 2018-07-16 HISTORY — DX: Hypothyroidism, unspecified: E03.9

## 2018-07-16 LAB — URINALYSIS, ROUTINE W REFLEX MICROSCOPIC
Bilirubin Urine: NEGATIVE
Glucose, UA: NEGATIVE mg/dL
KETONES UR: 5 mg/dL — AB
Nitrite: NEGATIVE
PROTEIN: 30 mg/dL — AB
RBC / HPF: >50 RBC/hpf — ABNORMAL HIGH (ref 0–5)
Specific Gravity, Urine: 1.024 (ref 1.005–1.030)
WBC, UA: >50 WBC/hpf — ABNORMAL HIGH (ref 0–5)
pH: 5 (ref 5.0–8.0)

## 2018-07-16 LAB — RAPID URINE DRUG SCREEN, HOSP PERFORMED
Amphetamines: NOT DETECTED
Barbiturates: NOT DETECTED
Benzodiazepines: NOT DETECTED
COCAINE: NOT DETECTED
Opiates: NOT DETECTED
Tetrahydrocannabinol: NOT DETECTED

## 2018-07-16 LAB — COMPREHENSIVE METABOLIC PANEL
ALT: 17 U/L (ref 0–44)
AST: 19 U/L (ref 15–41)
Albumin: 4.4 g/dL (ref 3.5–5.0)
Alkaline Phosphatase: 95 U/L (ref 38–126)
Anion gap: 10 (ref 5–15)
BILIRUBIN TOTAL: 0.6 mg/dL (ref 0.3–1.2)
BUN: 19 mg/dL (ref 8–23)
CHLORIDE: 106 mmol/L (ref 98–111)
CO2: 22 mmol/L (ref 22–32)
Calcium: 9 mg/dL (ref 8.9–10.3)
Creatinine, Ser: 0.93 mg/dL (ref 0.44–1.00)
GFR calc Af Amer: >60 mL/min (ref >60)
Glucose, Bld: 100 mg/dL — ABNORMAL HIGH (ref 70–99)
Potassium: 4 mmol/L (ref 3.5–5.1)
Sodium: 138 mmol/L (ref 135–145)
Total Protein: 7.1 g/dL (ref 6.5–8.1)

## 2018-07-16 LAB — CBC WITH DIFFERENTIAL/PLATELET
Abs Immature Granulocytes: 0.04 10*3/uL (ref 0.00–0.07)
Basophils Absolute: 0 10*3/uL (ref 0.0–0.1)
Basophils Relative: 0 %
Eosinophils Absolute: 0.1 10*3/uL (ref 0.0–0.5)
Eosinophils Relative: 1 %
HEMATOCRIT: 39.1 % (ref 36.0–46.0)
Hemoglobin: 12.6 g/dL (ref 12.0–15.0)
Immature Granulocytes: 0 %
Lymphocytes Relative: 22 %
Lymphs Abs: 2 10*3/uL (ref 0.7–4.0)
MCH: 30.1 pg (ref 26.0–34.0)
MCHC: 32.2 g/dL (ref 30.0–36.0)
MCV: 93.3 fL (ref 80.0–100.0)
MONOS PCT: 6 %
Monocytes Absolute: 0.6 10*3/uL (ref 0.1–1.0)
Neutro Abs: 6.4 10*3/uL (ref 1.7–7.7)
Neutrophils Relative %: 71 %
Platelets: 253 10*3/uL (ref 150–400)
RBC: 4.19 MIL/uL (ref 3.87–5.11)
RDW: 13.6 % (ref 11.5–15.5)
WBC: 9 10*3/uL (ref 4.0–10.5)
nRBC: 0 % (ref 0.0–0.2)

## 2018-07-16 LAB — ETHANOL: Alcohol, Ethyl (B): <10 mg/dL (ref <10)

## 2018-07-16 MED ORDER — CEPHALEXIN 500 MG PO CAPS
500.0000 mg | ORAL_CAPSULE | Freq: Once | ORAL | Status: AC
  Administered 2018-07-16: 500 mg via ORAL
  Filled 2018-07-16: qty 1

## 2018-07-16 MED ORDER — CEPHALEXIN 500 MG PO CAPS: 500.0000 mg | ORAL_CAPSULE | Freq: Three times a day (TID) | ORAL | 0 refills | Status: DC

## 2018-07-16 NOTE — ED Notes (Signed)
Pt's discharge instructions reviewed with pt's husband and all questions re: d/c instructions and follow up care answered. Pt d/c's wia WC.

## 2018-07-16 NOTE — ED Notes (Signed)
Bed: VJ28 Expected date:  Expected time:  Means of arrival:  Comments: Aggressive Behavior form [facility] Home

## 2018-07-16 NOTE — ED Provider Notes (Signed)
[facility]-EMERGENCY DEPT Provider Note   CSN: [id number] Arrival date & time: 07/16/18  1749    History   Chief Complaint Chief Complaint  Patient presents with  . Medical Clearance    HPI [name] is a 78 y.o. female.     78 year old female with history of dementia presents from nursing home with increased agitation.  Patient has been to nursing for the past 5 days and was there after reportedly being discharged to another facility due to her behavior.  According to EMS, husband does not allow patient to receive medication for her dementia.  No further history obtainable due to her current state     Past Medical History:  Diagnosis Date  . Arthritis   . Dementia (HCC)    Alzheimers  . Depression   . H/O tubal ligation   . Hx of cervical spine surgery   . Hyperthyroidism 06-2014  . Hypothyroid   . Hypothyroidism   . Myocardial infarction (HCC)   . Osteoporosis   . Vitamin B12 deficiency     Patient Active Problem List   Diagnosis Date Noted  . Pure hypercholesterolemia 05/14/2018  . Hyperthyroidism 03/29/2018  . Weight loss 03/29/2018  . 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 Alzheimer's disease (HCC) 12/31/2012  . Other vitamin B12 deficiency anemia 12/31/2012    Past Surgical History:  Procedure Laterality Date  . BREAST SURGERY     reduction  . bunionectomies    . FINGER ARTHROPLASTY  04/02/2012   Procedure: FINGER ARTHROPLASTY;  Surgeon: Cliffton Asters. [name], MD;  Location: [facility];  Service: Orthopedics;  Laterality: Left;  Left Thumb Suspension Arthroplasty   . KYPHOPLASTY N/A 04/07/2016   Procedure: THORACIC 7 KYPHOPLASTY;  Surgeon: Estill Bamberg, MD;  Location: [facility] OR;  Service: Orthopedics;  Laterality: N/A;  THORACIC 7 KYPHOPLASTY  . REDUCTION MAMMAPLASTY    . TONSILLECTOMY    . TUBAL LIGATION       OB History   No obstetric history on file.      Home Medications    Prior to Admission  medications   Medication Sig Start Date End Date Taking? Authorizing Provider  methimazole (TAPAZOLE) 5 MG tablet Take 5 mg by mouth daily. Mon-Fri 04/20/15   [provider]    Family History Family History  Problem Relation Age of Onset  . Cancer Mother        mesothelioma  . Cancer Father        mesothelioma  . Dementia Paternal Grandmother     Social History Social History   Tobacco Use  . Smoking status: Former Smoker    Types: Cigarettes    Last attempt to quit: 1980    Years since quitting: 40.1  . Smokeless tobacco: Never Used  Substance Use Topics  . Alcohol use: Yes    Comment: Rarely   . Drug use: No     Allergies   Asa [aspirin]   Review of Systems Review of Systems  Unable to perform ROS: Dementia     Physical Exam Updated Vital Signs BP (!) 122/97 (BP Location: Left Arm)   Pulse 82   Temp 98.3 F (36.8 C) (Axillary)   Resp 19   SpO2 100%   Physical Exam Vitals signs and nursing note reviewed.  Constitutional:      General: She is not in acute distress.    Appearance: Normal appearance. She is well-developed. She is not toxic-appearing.  HENT:  Head: Normocephalic and atraumatic.  Eyes:     General: Lids are normal.     Conjunctiva/sclera: Conjunctivae normal.     Pupils: Pupils are equal, round, and reactive to light.  Neck:     Musculoskeletal: Normal range of motion and neck supple.     Thyroid: No thyroid mass.     Trachea: No tracheal deviation.  Cardiovascular:     Rate and Rhythm: Normal rate and regular rhythm.     Heart sounds: Normal heart sounds. No murmur. No gallop.   Pulmonary:     Effort: Pulmonary effort is normal. No respiratory distress.     Breath sounds: Normal breath sounds. No stridor. No decreased breath sounds, wheezing, rhonchi or rales.  Abdominal:     General: Bowel sounds are normal. There is no distension.     Palpations: Abdomen is soft.     Tenderness: There is no abdominal tenderness.  There is no rebound.  Musculoskeletal: Normal range of motion.        General: No tenderness.  Skin:    General: Skin is warm and dry.     Findings: No abrasion or rash.  Neurological:     Mental Status: She is alert. Mental status is at baseline. She is disoriented.     GCS: GCS eye subscore is 4. GCS verbal subscore is 5. GCS motor subscore is 6.     Cranial Nerves: No cranial nerve deficit.     Sensory: No sensory deficit.     Gait: Gait normal.  Psychiatric:        Attention and Perception: She is inattentive.        Mood and Affect: Mood is anxious.        Behavior: Behavior is uncooperative.      ED Treatments / Results  Labs (all labs ordered are listed, but only abnormal results are displayed) Labs Reviewed  URINE CULTURE  CBC WITH DIFFERENTIAL/PLATELET  COMPREHENSIVE METABOLIC PANEL  ETHANOL  RAPID URINE DRUG SCREEN, HOSP PERFORMED  URINALYSIS, ROUTINE W REFLEX MICROSCOPIC    EKG None  Radiology No results found.  Procedures Procedures (including critical care time)  Medications Ordered in ED Medications - No data to display   Initial Impression / Assessment and Plan / ED Course  I have reviewed the triage vital signs and the nursing notes.  Pertinent labs & imaging results that were available during my care of the patient were reviewed by me and considered in my medical decision making (see chart for details).        Patient with evidence of UTI on her urinalysis.  Patient had been doing well up until several days ago and suspect that this could be the cause.  Will start on Keflex and she will follow with her doctor  Final Clinical Impressions(s) / ED Diagnoses   Final diagnoses:  None    ED Discharge Orders    None       Lorre Nick, MD 07/16/18 1940
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
## 2018-07-16 NOTE — Progress Notes (Signed)
ED MD at bedside to explain to pt's husband that she has a UTI and discuss plan of care.
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
## 2018-07-16 NOTE — Progress Notes (Signed)
EDMD at bedside

## 2018-07-18 LAB — URINE CULTURE

## 2018-07-23 ENCOUNTER — Encounter: Payer: Self-pay | Admitting: Internal Medicine

## 2018-07-23 ENCOUNTER — Ambulatory Visit (INDEPENDENT_AMBULATORY_CARE_PROVIDER_SITE_OTHER): Payer: Medicare HMO | Admitting: Internal Medicine

## 2018-07-23 VITALS — BP 122/80 | HR 75 | Ht 60.0 in | Wt 150.0 lb

## 2018-07-23 DIAGNOSIS — R69 Illness, unspecified: Secondary | ICD-10-CM | POA: Diagnosis not present

## 2018-07-23 DIAGNOSIS — M810 Age-related osteoporosis without current pathological fracture: Secondary | ICD-10-CM | POA: Diagnosis not present

## 2018-07-23 DIAGNOSIS — E0590 Thyrotoxicosis, unspecified without thyrotoxic crisis or storm: Secondary | ICD-10-CM | POA: Diagnosis not present

## 2018-07-23 DIAGNOSIS — G300 Alzheimer's disease with early onset: Secondary | ICD-10-CM

## 2018-07-23 DIAGNOSIS — R634 Abnormal weight loss: Secondary | ICD-10-CM | POA: Diagnosis not present

## 2018-07-23 DIAGNOSIS — G309 Alzheimer's disease, unspecified: Secondary | ICD-10-CM | POA: Diagnosis not present

## 2018-07-23 DIAGNOSIS — N3000 Acute cystitis without hematuria: Secondary | ICD-10-CM

## 2018-07-23 DIAGNOSIS — F02818 Dementia in other diseases classified elsewhere, unspecified severity, with other behavioral disturbance: Secondary | ICD-10-CM

## 2018-07-23 DIAGNOSIS — Z9183 Wandering in diseases classified elsewhere: Secondary | ICD-10-CM | POA: Diagnosis not present

## 2018-07-23 DIAGNOSIS — E7800 Pure hypercholesterolemia, unspecified: Secondary | ICD-10-CM | POA: Diagnosis not present

## 2018-07-23 DIAGNOSIS — F0281 Dementia in other diseases classified elsewhere with behavioral disturbance: Secondary | ICD-10-CM

## 2018-07-23 NOTE — Progress Notes (Signed)
Location:  [facility] clinic Provider:  Jemarion Roycroft L. Renato Gails, D.O., C.M.D.  Goals of Care:  Advanced Directives 06/15/2018  Does Patient Have a Medical Advance Directive? No  Type of Advance Directive -  Copy of Healthcare Power of Attorney in Chart? -  Would patient like information on creating a medical advance directive? No - Patient declined   Chief Complaint  Patient presents with  . Medical Management of Chronic Issues    follow-up on medication, pt now at [facility]    HPI: Patient is a 78 y.o. female seen today for medical management of chronic diseases.    She is newly at [facility].  She is adjusting slowly.  She likes to touch the other residents.    They are asking for ativan gel b/c she does not take pills well.  They had the doctor there prescribe that.    Has chronic back pain.  Has tylenol prn.    She's on an antibiotic for UTI prescribed in the ED on 07/16/18.    She was up late two nights ago.  Theres' a window to get her to bed.  If it's missed, she stays up.  After the lorazepam orally bid, she started to fall down a lot.  She is now spitting pills out or chewing them.    Melatonin also at bedtime for sleep.    She is on ensure b/c she was losing weight.  She's gained some back.  She is down 4 lbs from end of jan.  loved the ensure.  She is eating fairly well.  sometimes does better with finger food.  other times, likes utensils  She is seeing things no one else sees.  Her depth perception.    She had a blister on her foot--had a plastic cup she'd shoved inside her shoe.    Past Medical History:  Diagnosis Date  . Arthritis   . Dementia (HCC)    Alzheimers  . Depression   . H/O tubal ligation   . Hx of cervical spine surgery   . Hyperthyroidism 06-2014  . Hypothyroid   . Hypothyroidism   . Myocardial infarction (HCC)   . Osteoporosis   . Vitamin B12 deficiency     Past Surgical History:  Procedure Laterality Date  . BREAST SURGERY     reduction  . bunionectomies    . FINGER ARTHROPLASTY  04/02/2012   Procedure: FINGER ARTHROPLASTY;  Surgeon: Cliffton Asters. [name], MD;  Location: [facility];  Service: Orthopedics;  Laterality: Left;  Left Thumb Suspension Arthroplasty   . KYPHOPLASTY N/A 04/07/2016   Procedure: THORACIC 7 KYPHOPLASTY;  Surgeon: Estill Bamberg, MD;  Location: [facility] OR;  Service: Orthopedics;  Laterality: N/A;  THORACIC 7 KYPHOPLASTY  . REDUCTION MAMMAPLASTY    . TONSILLECTOMY    . TUBAL LIGATION      Allergies  Allergen Reactions  . Asa [Aspirin] Nausea And Vomiting and Other (See Comments)    Bleeding ulcers    Outpatient Encounter Medications as of 07/23/2018  Medication Sig  . Acetaminophen (TYLENOL PO) Take by mouth as needed. HUSBAND NOT SURE OF DOSE  . cephALEXin (KEFLEX) 500 MG capsule Take 1 capsule (500 mg total) by mouth 3 (three) times daily.  Marland Kitchen LORazepam (ATIVAN) 2 MG/ML concentrated solution Take by mouth every 8 (eight) hours. HUSBAND NOT SURE OF DOSE  . MELATONIN PO Take 1 tablet by mouth at bedtime. HUSBAND NOT SURE OF DOSE  . methimazole (TAPAZOLE) 5 MG tablet Take  5 mg by mouth daily. Mon-Fri   No facility-administered encounter medications on file as of 07/23/2018.     Review of Systems:  Review of Systems  Constitutional: Negative for chills, fever and malaise/fatigue.  HENT: Negative for congestion.   Eyes: Negative for blurred vision.       Glasses; poor peripheral vision related to her dementia  Respiratory: Negative for cough and shortness of breath.   Cardiovascular: Negative for chest pain, palpitations and leg swelling.  Gastrointestinal: Negative for abdominal pain and constipation.  Genitourinary: Negative for dysuria.  Musculoskeletal: Positive for back pain and falls. Negative for joint pain.  Skin: Negative for itching and rash.    Health Maintenance  Topic Date Due  . TETANUS/TDAP  12/06/1961  . COLONOSCOPY  12/06/1992  . DEXA SCAN  12/07/2007  .  PNA vac Low Risk Adult (1 of 2 - PCV13) 12/07/2007  . INFLUENZA VACCINE  Completed    Physical Exam: Vitals:   07/23/18 1449  BP: 122/80  Pulse: 75  SpO2: 99%  Weight: 150 lb (68 kg)  Height: 5' (1.524 m)   Body mass index is 29.29 kg/m. Physical Exam Vitals signs reviewed.  Constitutional:      General: She is not in acute distress.    Appearance: She is normal weight. She is not toxic-appearing.  HENT:     Head: Normocephalic and atraumatic.     Right Ear: External ear normal.     Left Ear: External ear normal.  Eyes:     Comments: glasses  Cardiovascular:     Rate and Rhythm: Normal rate and regular rhythm.     Pulses: Normal pulses.     Heart sounds: Normal heart sounds.  Pulmonary:     Effort: Pulmonary effort is normal.     Breath sounds: Normal breath sounds.  Abdominal:     General: Bowel sounds are normal. There is no distension.     Palpations: Abdomen is soft.     Tenderness: There is no abdominal tenderness. There is no guarding or rebound.  Neurological:     General: No focal deficit present.     Mental Status: She is alert.     Cranial Nerves: No cranial nerve deficit.     Motor: No weakness.     Gait: Gait abnormal.     Comments: Shuffling gait, looks down  Psychiatric:     Comments: Pleasant, gets up in the middle of visit, walks around some, sits back down; harder to get her attention and focus today     Labs reviewed: Basic Metabolic Panel: Recent Labs    04/09/18 07/16/18 1845  NA 143 138  K 3.8 4.0  CL  --  106  CO2  --  22  GLUCOSE  --  100*  BUN 14 19  CREATININE 0.8 0.93  CALCIUM  --  9.0  TSH 0.41  --    Liver Function Tests: Recent Labs    07/16/18 1845  AST 19  ALT 17  ALKPHOS 95  BILITOT 0.6  PROT 7.1  ALBUMIN 4.4   No results for input(s): LIPASE, AMYLASE in the last 8760 hours. No results for input(s): AMMONIA in the last 8760 hours. CBC: Recent Labs    07/16/18 1845  WBC 9.0  NEUTROABS 6.4  HGB 12.6  HCT  39.1  MCV 93.3  PLT 253   Lipid Panel: No results for input(s): CHOL, HDL, LDLCALC, TRIG, CHOLHDL, LDLDIRECT in the last 8760 hours. No results found  for: HGBA1C  Procedures since last visit: No results found.  Assessment/Plan 1. 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 Weight loss -eating more sweets and fewer nourishing foods, cont to encourage intake and may use boost if skips meals  3. Hyperthyroidism -cont methimazole Lab Results  Component Value Date   TSH 0.41 04/09/2018    4. Acute cystitis without hematuria -finishing off keflex but no notable behavioral improvement or change noted  Labs/tests ordered:  No orders of the defined types were placed in this encounter.   Next appt:  10/29/2018   Kohler Pellerito L. Brandon Scarbrough, D.O. Geriatrics [facility] [address], [place] [address] Cell Phone (Mon-Fri 8am-5pm):  [phone] On Call:  [phone] & follow prompts after 5pm & weekends Office Phone:  [phone] Office Fax:  [phone]

## 2018-07-26 ENCOUNTER — Ambulatory Visit: Payer: Medicare HMO | Admitting: Internal Medicine

## 2018-07-30 DIAGNOSIS — Z9183 Wandering in diseases classified elsewhere: Secondary | ICD-10-CM | POA: Diagnosis not present

## 2018-07-30 DIAGNOSIS — G309 Alzheimer's disease, unspecified: Secondary | ICD-10-CM | POA: Diagnosis not present

## 2018-08-01 DIAGNOSIS — D649 Anemia, unspecified: Secondary | ICD-10-CM | POA: Diagnosis not present

## 2018-08-01 DIAGNOSIS — R319 Hematuria, unspecified: Secondary | ICD-10-CM | POA: Diagnosis not present

## 2018-08-01 DIAGNOSIS — R300 Dysuria: Secondary | ICD-10-CM | POA: Diagnosis not present

## 2018-08-01 DIAGNOSIS — E039 Hypothyroidism, unspecified: Secondary | ICD-10-CM | POA: Diagnosis not present

## 2018-08-01 DIAGNOSIS — E0590 Thyrotoxicosis, unspecified without thyrotoxic crisis or storm: Secondary | ICD-10-CM | POA: Diagnosis not present

## 2018-08-01 DIAGNOSIS — D519 Vitamin B12 deficiency anemia, unspecified: Secondary | ICD-10-CM | POA: Diagnosis not present

## 2018-09-19 ENCOUNTER — Ambulatory Visit: Payer: Medicare HMO | Admitting: Neurology

## 2018-10-29 ENCOUNTER — Ambulatory Visit: Payer: Medicare HMO | Admitting: Internal Medicine

## 2018-11-12 DIAGNOSIS — E0590 Thyrotoxicosis, unspecified without thyrotoxic crisis or storm: Secondary | ICD-10-CM | POA: Diagnosis not present

## 2018-11-12 DIAGNOSIS — Z9183 Wandering in diseases classified elsewhere: Secondary | ICD-10-CM | POA: Diagnosis not present

## 2018-11-12 DIAGNOSIS — G309 Alzheimer's disease, unspecified: Secondary | ICD-10-CM | POA: Diagnosis not present

## 2019-02-11 DIAGNOSIS — Z9183 Wandering in diseases classified elsewhere: Secondary | ICD-10-CM | POA: Diagnosis not present

## 2019-02-11 DIAGNOSIS — G309 Alzheimer's disease, unspecified: Secondary | ICD-10-CM | POA: Diagnosis not present

## 2019-02-11 DIAGNOSIS — E0590 Thyrotoxicosis, unspecified without thyrotoxic crisis or storm: Secondary | ICD-10-CM | POA: Diagnosis not present

## 2019-03-11 DIAGNOSIS — E0590 Thyrotoxicosis, unspecified without thyrotoxic crisis or storm: Secondary | ICD-10-CM | POA: Diagnosis not present

## 2019-03-11 DIAGNOSIS — G309 Alzheimer's disease, unspecified: Secondary | ICD-10-CM | POA: Diagnosis not present

## 2019-03-11 DIAGNOSIS — Z9183 Wandering in diseases classified elsewhere: Secondary | ICD-10-CM | POA: Diagnosis not present

## 2019-06-03 DIAGNOSIS — Z20828 Contact with and (suspected) exposure to other viral communicable diseases: Secondary | ICD-10-CM | POA: Diagnosis not present

## 2019-06-14 DIAGNOSIS — Z20828 Contact with and (suspected) exposure to other viral communicable diseases: Secondary | ICD-10-CM | POA: Diagnosis not present

## 2019-06-23 ENCOUNTER — Emergency Department (HOSPITAL_COMMUNITY)
Admission: EM | Admit: 2019-06-23 | Discharge: 2019-06-23 | Disposition: A | Discharge status: Home / Self Care | Payer: Medicare HMO | Attending: Emergency Medicine | Admitting: Emergency Medicine

## 2019-06-23 ENCOUNTER — Encounter (HOSPITAL_COMMUNITY): Payer: Self-pay | Admitting: Emergency Medicine

## 2019-06-23 DIAGNOSIS — F0280 Dementia in other diseases classified elsewhere without behavioral disturbance: Secondary | ICD-10-CM | POA: Insufficient documentation

## 2019-06-23 DIAGNOSIS — I959 Hypotension, unspecified: Secondary | ICD-10-CM | POA: Diagnosis not present

## 2019-06-23 DIAGNOSIS — Z87891 Personal history of nicotine dependence: Secondary | ICD-10-CM | POA: Insufficient documentation

## 2019-06-23 DIAGNOSIS — I252 Old myocardial infarction: Secondary | ICD-10-CM | POA: Diagnosis not present

## 2019-06-23 DIAGNOSIS — E039 Hypothyroidism, unspecified: Secondary | ICD-10-CM | POA: Diagnosis not present

## 2019-06-23 DIAGNOSIS — R69 Illness, unspecified: Secondary | ICD-10-CM | POA: Diagnosis not present

## 2019-06-23 DIAGNOSIS — R279 Unspecified lack of coordination: Secondary | ICD-10-CM | POA: Diagnosis not present

## 2019-06-23 DIAGNOSIS — T7840XA Allergy, unspecified, initial encounter: Secondary | ICD-10-CM | POA: Diagnosis not present

## 2019-06-23 DIAGNOSIS — Z79899 Other long term (current) drug therapy: Secondary | ICD-10-CM | POA: Insufficient documentation

## 2019-06-23 DIAGNOSIS — U071 COVID-19: Secondary | ICD-10-CM | POA: Insufficient documentation

## 2019-06-23 DIAGNOSIS — R1111 Vomiting without nausea: Secondary | ICD-10-CM | POA: Diagnosis not present

## 2019-06-23 DIAGNOSIS — R110 Nausea: Secondary | ICD-10-CM | POA: Diagnosis not present

## 2019-06-23 DIAGNOSIS — Z743 Need for continuous supervision: Secondary | ICD-10-CM | POA: Diagnosis not present

## 2019-06-23 DIAGNOSIS — R1110 Vomiting, unspecified: Secondary | ICD-10-CM | POA: Diagnosis present

## 2019-06-23 DIAGNOSIS — G309 Alzheimer's disease, unspecified: Secondary | ICD-10-CM | POA: Diagnosis not present

## 2019-06-23 LAB — URINALYSIS, ROUTINE W REFLEX MICROSCOPIC
Bacteria, UA: NONE SEEN
Bilirubin Urine: NEGATIVE
Glucose, UA: NEGATIVE mg/dL
Hgb urine dipstick: NEGATIVE
Ketones, ur: 5 mg/dL — AB
Leukocytes,Ua: NEGATIVE
Nitrite: NEGATIVE
Protein, ur: 30 mg/dL — AB
Specific Gravity, Urine: 1.02 (ref 1.005–1.030)
pH: 6 (ref 5.0–8.0)

## 2019-06-23 LAB — RESPIRATORY PANEL BY RT PCR (FLU A&B, COVID)
Influenza A by PCR: NEGATIVE
Influenza B by PCR: NEGATIVE
SARS Coronavirus 2 by RT PCR: POSITIVE — AB

## 2019-06-23 LAB — CBC WITH DIFFERENTIAL/PLATELET
Abs Immature Granulocytes: 0.02 10*3/uL (ref 0.00–0.07)
Basophils Absolute: 0 10*3/uL (ref 0.0–0.1)
Basophils Relative: 0 %
Eosinophils Absolute: 0 10*3/uL (ref 0.0–0.5)
Eosinophils Relative: 1 %
HCT: 35.3 % — ABNORMAL LOW (ref 36.0–46.0)
Hemoglobin: 11.4 g/dL — ABNORMAL LOW (ref 12.0–15.0)
Immature Granulocytes: 1 %
Lymphocytes Relative: 20 %
Lymphs Abs: 0.8 10*3/uL (ref 0.7–4.0)
MCH: 30.2 pg (ref 26.0–34.0)
MCHC: 32.3 g/dL (ref 30.0–36.0)
MCV: 93.4 fL (ref 80.0–100.0)
Monocytes Absolute: 0.3 10*3/uL (ref 0.1–1.0)
Monocytes Relative: 6 %
Neutro Abs: 3.1 10*3/uL (ref 1.7–7.7)
Neutrophils Relative %: 72 %
Platelets: 159 10*3/uL (ref 150–400)
RBC: 3.78 MIL/uL — ABNORMAL LOW (ref 3.87–5.11)
RDW: 13.2 % (ref 11.5–15.5)
WBC: 4.2 10*3/uL (ref 4.0–10.5)
nRBC: 0 % (ref 0.0–0.2)

## 2019-06-23 LAB — COMPREHENSIVE METABOLIC PANEL
ALT: 15 U/L (ref 0–44)
AST: 19 U/L (ref 15–41)
Albumin: 3.8 g/dL (ref 3.5–5.0)
Alkaline Phosphatase: 79 U/L (ref 38–126)
Anion gap: 9 (ref 5–15)
BUN: 24 mg/dL — ABNORMAL HIGH (ref 8–23)
CO2: 24 mmol/L (ref 22–32)
Calcium: 8.6 mg/dL — ABNORMAL LOW (ref 8.9–10.3)
Chloride: 113 mmol/L — ABNORMAL HIGH (ref 98–111)
Creatinine, Ser: 0.68 mg/dL (ref 0.44–1.00)
GFR calc Af Amer: >60 mL/min (ref >60)
GFR calc non Af Amer: >60 mL/min (ref >60)
Glucose, Bld: 102 mg/dL — ABNORMAL HIGH (ref 70–99)
Potassium: 3.7 mmol/L (ref 3.5–5.1)
Sodium: 146 mmol/L — ABNORMAL HIGH (ref 135–145)
Total Bilirubin: 0.6 mg/dL (ref 0.3–1.2)
Total Protein: 6.6 g/dL (ref 6.5–8.1)

## 2019-06-23 LAB — POC SARS CORONAVIRUS 2 AG -  ED: SARS Coronavirus 2 Ag: NEGATIVE

## 2019-06-23 MED ORDER — SODIUM CHLORIDE 0.9 % IV BOLUS
500.0000 mL | Freq: Once | INTRAVENOUS | Status: AC
  Administered 2019-06-23: 500 mL via INTRAVENOUS

## 2019-06-23 MED ORDER — ONDANSETRON HCL 4 MG/2ML IJ SOLN
4.0000 mg | Freq: Once | INTRAMUSCULAR | Status: AC
  Administered 2019-06-23: 4 mg via INTRAVENOUS
  Filled 2019-06-23: qty 2

## 2019-06-23 NOTE — ED Triage Notes (Signed)
Arrives via EMS from [facility], facility reports an episode of emesis this morning. Paperwork states patient is COVID negative, facility reports she is COVID positive. Hx of dementia. Patient ambulatory on scene.

## 2019-06-23 NOTE — ED Notes (Signed)
[facility] called for transport back to [facility]. [facility] called and notified of patient's return.

## 2019-06-23 NOTE — ED Provider Notes (Signed)
[place] DEPT Provider Note   CSN: [id number] Arrival date & time: 06/23/19  6283     History Chief Complaint  Patient presents with  . Emesis    [name] is a 79 y.o. female.  HPI She is here for evaluation of vomiting.  There is verbal report of Covid status, reported as both positive and negative.  It is reported that she has been vomiting.  She is unable to give any history.   Level 5 caveat-dementia   Past Medical History:  Diagnosis Date  . Arthritis   . Dementia ([place])    Alzheimers  . Depression   . H/O tubal ligation   . Hx of cervical spine surgery   . Hyperthyroidism 06-2014  . Hypothyroid   . Hypothyroidism   . Myocardial infarction ([place])   . Osteoporosis   . Vitamin B12 deficiency     Patient Active Problem List   Diagnosis Date Noted  . Pure hypercholesterolemia 05/14/2018  . Hyperthyroidism 03/29/2018  . Weight loss 03/29/2018  . 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 Alzheimer's disease ([place]) 12/31/2012  . Other vitamin B12 deficiency anemia 12/31/2012    Past Surgical History:  Procedure Laterality Date  . BREAST SURGERY     reduction  . bunionectomies    . FINGER ARTHROPLASTY  04/02/2012   Procedure: FINGER ARTHROPLASTY;  Surgeon: Rayvon Char. Grandville Silos, MD;  Location: [facility];  Service: Orthopedics;  Laterality: Left;  Left Thumb Suspension Arthroplasty   . KYPHOPLASTY N/A 04/07/2016   Procedure: THORACIC 7 KYPHOPLASTY;  Surgeon: Phylliss Bob, MD;  Location: [facility];  Service: Orthopedics;  Laterality: N/A;  THORACIC 7 KYPHOPLASTY  . REDUCTION MAMMAPLASTY    . TONSILLECTOMY    . TUBAL LIGATION       OB History   No obstetric history on file.     Family History  Problem Relation Age of Onset  . Cancer Mother        mesothelioma  . Cancer Father        mesothelioma  . Dementia Paternal Grandmother     Social History   Tobacco Use  . Smoking status: Former Smoker   Types: Cigarettes    Quit date: 1980    Years since quitting: 41.1  . Smokeless tobacco: Never Used  Substance Use Topics  . Alcohol use: Yes    Comment: Rarely   . Drug use: No    Home Medications Prior to Admission medications   Medication Sig Start Date End Date Taking? Authorizing Provider  acetaminophen (TYLENOL) 500 MG tablet Take 500 mg by mouth 2 (two) times daily.   Yes [provider]  lactose free nutrition (BOOST) LIQD Take 237 mLs by mouth daily as needed (food supplement). Per request of resident or family   Yes [provider]  Melatonin 3 MG TABS Take 3 mg by mouth at bedtime.   Yes [provider]  methimazole (TAPAZOLE) 5 MG tablet Take 5 mg by mouth See admin instructions. Take 5 mg by mouth on every day except Sunday 04/20/15  Yes [provider]  NON FORMULARY Apply 1 mL topically 2 (two) times daily. Lorazepam PLO gel - Apply 1 ml (0.5 mg) topically twice daily for agitation   Yes [provider]  cephALEXin (KEFLEX) 500 MG capsule Take 1 capsule (500 mg total) by mouth 3 (three) times daily. Patient not taking: Reported on 06/23/2019 07/16/18   Lacretia Leigh, MD    Allergies  Asa [aspirin]  Review of Systems   Review of Systems  Unable to perform ROS: Dementia    Physical Exam Updated Vital Signs BP (!) 100/49   Pulse 68   Temp (!) 97 F (36.1 C) (Rectal) Comment: RN aware  Resp 14   Ht '5\' 5"'$  (1.651 m)   Wt 63.5 kg   SpO2 95%   BMI 23.30 kg/m   Physical Exam Vitals and nursing note reviewed.  Constitutional:      General: She is not in acute distress.    Appearance: She is well-developed. She is ill-appearing. She is not toxic-appearing or diaphoretic.  HENT:     Head: Normocephalic and atraumatic.     Right Ear: External ear normal.     Left Ear: External ear normal.  Eyes:     Conjunctiva/sclera: Conjunctivae normal.     Pupils: Pupils are equal, round, and reactive to light.  Neck:      Trachea: Phonation normal.  Cardiovascular:     Rate and Rhythm: Normal rate.  Pulmonary:     Effort: Pulmonary effort is normal.  Abdominal:     General: There is no distension.     Palpations: Abdomen is soft. There is no mass.     Tenderness: There is no abdominal tenderness.  Musculoskeletal:        General: Normal range of motion.     Cervical back: Normal range of motion and neck supple.  Skin:    General: Skin is warm and dry.  Neurological:     Mental Status: She is alert.     Cranial Nerves: No cranial nerve deficit.     Motor: No abnormal muscle tone.     Coordination: Coordination normal.     Comments: She was able to sit up on command, but did not speak.  She is able to support herself in a sitting position, on the stretcher.  Psychiatric:        Mood and Affect: Mood normal.     Comments: She seems detached.  She gesticulates instead of verbally responding to questions.     ED Results / Procedures / Treatments   Labs (all labs ordered are listed, but only abnormal results are displayed) Labs Reviewed  RESPIRATORY PANEL BY RT PCR (FLU A&B, COVID) - Abnormal; Notable for the following components:      Result Value   SARS Coronavirus 2 by RT PCR POSITIVE (*)    All other components within normal limits  COMPREHENSIVE METABOLIC PANEL - Abnormal; Notable for the following components:   Sodium 146 (*)    Chloride 113 (*)    Glucose, Bld 102 (*)    BUN 24 (*)    Calcium 8.6 (*)    All other components within normal limits  CBC WITH DIFFERENTIAL/PLATELET - Abnormal; Notable for the following components:   RBC 3.78 (*)    Hemoglobin 11.4 (*)    HCT 35.3 (*)    All other components within normal limits  URINALYSIS, ROUTINE W REFLEX MICROSCOPIC - Abnormal; Notable for the following components:   Ketones, ur 5 (*)    Protein, ur 30 (*)    All other components within normal limits  POC SARS CORONAVIRUS 2 AG -  ED    EKG None  Radiology No results  found.  Procedures Procedures (including critical care time)  Medications Ordered in ED Medications  sodium chloride 0.9 % bolus 500 mL (0 mLs Intravenous Stopped 06/23/19 1527)  ondansetron (ZOFRAN) injection  4 mg (4 mg Intravenous Given 06/23/19 1038)  sodium chloride 0.9 % bolus 500 mL (0 mLs Intravenous Stopped 06/23/19 1232)    ED Course  I have reviewed the triage vital signs and the nursing notes.  Pertinent labs & imaging results that were available during my care of the patient were reviewed by me and considered in my medical decision making (see chart for details).  Clinical Course as of Jun 23 1527  Sun Jun 23, 2019  1432 Normal except presence of ketones and protein  Urinalysis, Routine w reflex microscopic(!) [EW]  1433 Normal except hemoglobin low  CBC with Differential(!) [EW]  1433 Negative  POC SARS Coronavirus 2 Ag-ED - Nasal Swab (BD Veritor Kit) [EW]  1433 Normal except sodium high, chloride high, glucose high, BUN high, calcium low  Comprehensive metabolic panel(!) [EW]    Clinical Course User Index [EW] Daleen Bo, MD   MDM Rules/Calculators/A&P                       Patient Vitals for the past 24 hrs:  BP Temp Temp src Pulse Resp SpO2 Height Weight  06/23/19 1500 (!) 100/49 -- -- 68 14 95 % -- --  06/23/19 1300 (!) 112/56 -- -- 79 13 96 % -- --  06/23/19 1215 112/66 -- -- 84 18 98 % -- --  06/23/19 1130 (!) 102/56 -- -- 71 11 95 % -- --  06/23/19 1042 104/67 -- -- 69 13 96 % -- --  06/23/19 1030 -- -- -- 73 17 100 % -- --  06/23/19 0950 (!) 102/50 (!) 97 F (36.1 C) Rectal 68 20 98 % '5\' 5"'$  (1.651 m) 63.5 kg    2:33 PM Reevaluation with update and discussion. After initial assessment and treatment, an updated evaluation reveals no change in clinical status. Daleen Bo   Medical Decision Making: Reported vomiting, not seen on evaluation and observation in the ED.  Screening evaluation, no urinary tract infection, mild dehydration, no overt  bacterial infection.  Since there was confusion about Covid status, a rapid PCR Covid test with influenza screening was sent.  No indication for hospitalization or further intervention at this time.   [name] was evaluated in Emergency Department on 06/23/2019 for the symptoms described in the history of present illness. She was evaluated in the context of the global COVID-19 pandemic, which necessitated consideration that the patient might be at risk for infection with the SARS-CoV-2 virus that causes COVID-19. Institutional protocols and algorithms that pertain to the evaluation of patients at risk for COVID-19 are in a state of rapid change based on information released by regulatory bodies including the CDC and federal and state organizations. These policies and algorithms were followed during the patient's care in the ED.   CRITICAL CARE-no Performed by: Daleen Bo   Nursing Notes Reviewed/ Care Coordinated Applicable Imaging Reviewed Interpretation of Laboratory Data incorporated into ED treatment  The patient appears reasonably screened and/or stabilized for discharge and I doubt any other medical condition or other [facility] requiring further screening, evaluation, or treatment in the ED at this time prior to discharge.  Plan: Home Medications-continue usual medicine can use Tylenol for fever or pain; Home Treatments-gradually start; return here if the recommended treatment, does not improve the symptoms; Recommended follow up-PCP, as needed    Final Clinical Impression(s) / ED Diagnoses Final diagnoses:  COVID-19 virus infection    Rx / DC Orders ED Discharge Orders  None       Daleen Bo, MD 06/23/19 1530

## 2019-06-23 NOTE — Discharge Instructions (Addendum)
Testing indicates that she has COVID-19 coronavirus infection.  There is no specific treatment for her at this time.  Watch her vital signs including oxygen saturation.  Return here if the oxygen saturation gets to be less than 90%.  Use Tylenol for fever.  Encourage her to drink water frequently.  The labs today indicated that she was mildly dehydrated.  This has been treated with intravenous fluids.

## 2019-06-24 DIAGNOSIS — R2681 Unsteadiness on feet: Secondary | ICD-10-CM | POA: Diagnosis not present

## 2019-06-24 DIAGNOSIS — R2689 Other abnormalities of gait and mobility: Secondary | ICD-10-CM | POA: Diagnosis not present

## 2019-06-24 DIAGNOSIS — U071 COVID-19: Secondary | ICD-10-CM | POA: Diagnosis not present

## 2019-06-24 DIAGNOSIS — Z9183 Wandering in diseases classified elsewhere: Secondary | ICD-10-CM | POA: Diagnosis not present

## 2019-06-24 DIAGNOSIS — G309 Alzheimer's disease, unspecified: Secondary | ICD-10-CM | POA: Diagnosis not present

## 2019-06-24 DIAGNOSIS — R296 Repeated falls: Secondary | ICD-10-CM | POA: Diagnosis not present

## 2019-06-28 DIAGNOSIS — R2689 Other abnormalities of gait and mobility: Secondary | ICD-10-CM | POA: Diagnosis not present

## 2019-06-28 DIAGNOSIS — R2681 Unsteadiness on feet: Secondary | ICD-10-CM | POA: Diagnosis not present

## 2019-07-01 DIAGNOSIS — R2689 Other abnormalities of gait and mobility: Secondary | ICD-10-CM | POA: Diagnosis not present

## 2019-07-01 DIAGNOSIS — R2681 Unsteadiness on feet: Secondary | ICD-10-CM | POA: Diagnosis not present

## 2019-07-04 DIAGNOSIS — R2689 Other abnormalities of gait and mobility: Secondary | ICD-10-CM | POA: Diagnosis not present

## 2019-07-04 DIAGNOSIS — R2681 Unsteadiness on feet: Secondary | ICD-10-CM | POA: Diagnosis not present

## 2019-07-09 ENCOUNTER — Other Ambulatory Visit: Payer: Self-pay

## 2019-07-09 ENCOUNTER — Emergency Department (HOSPITAL_COMMUNITY): Payer: Medicare HMO

## 2019-07-09 ENCOUNTER — Emergency Department (HOSPITAL_COMMUNITY)
Admission: EM | Admit: 2019-07-09 | Discharge: 2019-07-09 | Disposition: A | Discharge status: Skilled Nursing Facility | Payer: Medicare HMO | Attending: Emergency Medicine | Admitting: Emergency Medicine

## 2019-07-09 ENCOUNTER — Encounter (HOSPITAL_COMMUNITY): Payer: Self-pay

## 2019-07-09 DIAGNOSIS — E039 Hypothyroidism, unspecified: Secondary | ICD-10-CM | POA: Insufficient documentation

## 2019-07-09 DIAGNOSIS — Y999 Unspecified external cause status: Secondary | ICD-10-CM | POA: Insufficient documentation

## 2019-07-09 DIAGNOSIS — M542 Cervicalgia: Secondary | ICD-10-CM | POA: Diagnosis not present

## 2019-07-09 DIAGNOSIS — Z87891 Personal history of nicotine dependence: Secondary | ICD-10-CM | POA: Insufficient documentation

## 2019-07-09 DIAGNOSIS — W010XXA Fall on same level from slipping, tripping and stumbling without subsequent striking against object, initial encounter: Secondary | ICD-10-CM | POA: Insufficient documentation

## 2019-07-09 DIAGNOSIS — W19XXXA Unspecified fall, initial encounter: Secondary | ICD-10-CM | POA: Diagnosis not present

## 2019-07-09 DIAGNOSIS — F0280 Dementia in other diseases classified elsewhere without behavioral disturbance: Secondary | ICD-10-CM | POA: Diagnosis not present

## 2019-07-09 DIAGNOSIS — S01112A Laceration without foreign body of left eyelid and periocular area, initial encounter: Secondary | ICD-10-CM | POA: Diagnosis not present

## 2019-07-09 DIAGNOSIS — Y939 Activity, unspecified: Secondary | ICD-10-CM | POA: Diagnosis not present

## 2019-07-09 DIAGNOSIS — S12101A Unspecified nondisplaced fracture of second cervical vertebra, initial encounter for closed fracture: Secondary | ICD-10-CM | POA: Diagnosis not present

## 2019-07-09 DIAGNOSIS — S0101XA Laceration without foreign body of scalp, initial encounter: Secondary | ICD-10-CM | POA: Diagnosis not present

## 2019-07-09 DIAGNOSIS — G309 Alzheimer's disease, unspecified: Secondary | ICD-10-CM | POA: Diagnosis not present

## 2019-07-09 DIAGNOSIS — S12100A Unspecified displaced fracture of second cervical vertebra, initial encounter for closed fracture: Secondary | ICD-10-CM | POA: Diagnosis not present

## 2019-07-09 DIAGNOSIS — S0990XA Unspecified injury of head, initial encounter: Secondary | ICD-10-CM | POA: Diagnosis not present

## 2019-07-09 DIAGNOSIS — Z7401 Bed confinement status: Secondary | ICD-10-CM | POA: Diagnosis not present

## 2019-07-09 DIAGNOSIS — S199XXA Unspecified injury of neck, initial encounter: Secondary | ICD-10-CM | POA: Diagnosis not present

## 2019-07-09 DIAGNOSIS — Y9210 Unspecified residential institution as the place of occurrence of the external cause: Secondary | ICD-10-CM | POA: Insufficient documentation

## 2019-07-09 DIAGNOSIS — R52 Pain, unspecified: Secondary | ICD-10-CM | POA: Diagnosis not present

## 2019-07-09 DIAGNOSIS — R69 Illness, unspecified: Secondary | ICD-10-CM | POA: Diagnosis not present

## 2019-07-09 DIAGNOSIS — M2550 Pain in unspecified joint: Secondary | ICD-10-CM | POA: Diagnosis not present

## 2019-07-09 LAB — CBC WITH DIFFERENTIAL/PLATELET
Abs Immature Granulocytes: 0.02 10*3/uL (ref 0.00–0.07)
Basophils Absolute: 0 10*3/uL (ref 0.0–0.1)
Basophils Relative: 1 %
Eosinophils Absolute: 0.1 10*3/uL (ref 0.0–0.5)
Eosinophils Relative: 1 %
HCT: 32.7 % — ABNORMAL LOW (ref 36.0–46.0)
Hemoglobin: 10.6 g/dL — ABNORMAL LOW (ref 12.0–15.0)
Immature Granulocytes: 0 %
Lymphocytes Relative: 25 %
Lymphs Abs: 1.2 10*3/uL (ref 0.7–4.0)
MCH: 31.2 pg (ref 26.0–34.0)
MCHC: 32.4 g/dL (ref 30.0–36.0)
MCV: 96.2 fL (ref 80.0–100.0)
Monocytes Absolute: 0.4 10*3/uL (ref 0.1–1.0)
Monocytes Relative: 8 %
Neutro Abs: 3.2 10*3/uL (ref 1.7–7.7)
Neutrophils Relative %: 65 %
Platelets: 206 10*3/uL (ref 150–400)
RBC: 3.4 MIL/uL — ABNORMAL LOW (ref 3.87–5.11)
RDW: 13.5 % (ref 11.5–15.5)
WBC: 5 10*3/uL (ref 4.0–10.5)
nRBC: 0 % (ref 0.0–0.2)

## 2019-07-09 LAB — BASIC METABOLIC PANEL
Anion gap: 8 (ref 5–15)
BUN: 18 mg/dL (ref 8–23)
CO2: 27 mmol/L (ref 22–32)
Calcium: 8.7 mg/dL — ABNORMAL LOW (ref 8.9–10.3)
Chloride: 108 mmol/L (ref 98–111)
Creatinine, Ser: 0.66 mg/dL (ref 0.44–1.00)
GFR calc Af Amer: >60 mL/min (ref >60)
GFR calc non Af Amer: >60 mL/min (ref >60)
Glucose, Bld: 82 mg/dL (ref 70–99)
Potassium: 4.2 mmol/L (ref 3.5–5.1)
Sodium: 143 mmol/L (ref 135–145)

## 2019-07-09 MED ORDER — HALOPERIDOL LACTATE 5 MG/ML IJ SOLN
2.0000 mg | Freq: Once | INTRAMUSCULAR | Status: AC
  Administered 2019-07-09: 2 mg via INTRAMUSCULAR
  Filled 2019-07-09: qty 1

## 2019-07-09 MED ORDER — SODIUM CHLORIDE (PF) 0.9 % IJ SOLN
INTRAMUSCULAR | Status: AC
  Filled 2019-07-09: qty 50

## 2019-07-09 MED ORDER — IOHEXOL 350 MG/ML SOLN
80.0000 mL | Freq: Once | INTRAVENOUS | Status: AC | PRN
  Administered 2019-07-09: 80 mL via INTRAVENOUS

## 2019-07-09 NOTE — ED Notes (Signed)
[facility] called for transport.  

## 2019-07-09 NOTE — ED Notes (Signed)
[name], husband would like an update on his wife when all the tests come back.

## 2019-07-09 NOTE — ED Notes (Signed)
PT[facility] at bedside to transport pt back to SNF. Pt was saturated in urine. Pt cleaned and provided a fresh brief and gown for transport. Pt given warm blankets for transport.

## 2019-07-09 NOTE — ED Notes (Signed)
Husband's phone # [phone]

## 2019-07-09 NOTE — ED Provider Notes (Signed)
patient care assumed at 1630. Patient here for evaluation of injuries following a fall, has a C2 fracture on CT C-spine. CTA neck pending.  Patient required Haldol for mild agitation so she could obtain CTA. CTA is negative for vertebral injury. CT notes that C2 fracture may be old. Discussed with patient's husband findings of studies. Plan to discharge back to facility with outpatient follow-up.   Tilden Fossa, MD 07/09/19 [phone]

## 2019-07-09 NOTE — Discharge Instructions (Addendum)
The CT scan showed a fracture at C2 transverse process.  Please call neurosurgery to schedule follow-up appointment.  Should use soft collar at all times.  Can remove for bathing.  Please follow-up with your primary doctor regarding head wound.  Keep clean and dry.  Return to ER for any additional falls, any difficulty breathing or chest pain.

## 2019-07-09 NOTE — ED Provider Notes (Signed)
[facility]-EMERGENCY DEPT Provider Note   CSN: [id number] Arrival date & time: 07/09/19  1351     History Chief Complaint  Patient presents with  . Fall  . Head Laceration    Soul Deveney Salak is a 79 y.o. female.    History limited due to baseline dementia  Presents to emergency department from facility after witnessed fall.  Tripped over another resident.  Noted injury to left forehead, no other injuries noted by staff.  Patient unable to provide any additional history.  HPI     Past Medical History:  Diagnosis Date  . Arthritis   . Dementia (HCC)    Alzheimers  . Depression   . H/O tubal ligation   . Hx of cervical spine surgery   . Hyperthyroidism 06-2014  . Hypothyroid   . Hypothyroidism   . Myocardial infarction (HCC)   . Osteoporosis   . Vitamin B12 deficiency     Patient Active Problem List   Diagnosis Date Noted  . Pure hypercholesterolemia 05/14/2018  . Hyperthyroidism 03/29/2018  . Weight loss 03/29/2018  . 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 Alzheimer's disease (HCC) 12/31/2012  . Other vitamin B12 deficiency anemia 12/31/2012    Past Surgical History:  Procedure Laterality Date  . BREAST SURGERY     reduction  . bunionectomies    . FINGER ARTHROPLASTY  04/02/2012   Procedure: FINGER ARTHROPLASTY;  Surgeon: Cliffton Asters. [name], MD;  Location: [facility];  Service: Orthopedics;  Laterality: Left;  Left Thumb Suspension Arthroplasty   . KYPHOPLASTY N/A 04/07/2016   Procedure: THORACIC 7 KYPHOPLASTY;  Surgeon: Estill Bamberg, MD;  Location: [facility] OR;  Service: Orthopedics;  Laterality: N/A;  THORACIC 7 KYPHOPLASTY  . REDUCTION MAMMAPLASTY    . TONSILLECTOMY    . TUBAL LIGATION       OB History   No obstetric history on file.     Family History  Problem Relation Age of Onset  . Cancer Mother        mesothelioma  . Cancer Father        mesothelioma  . Dementia Paternal Grandmother     Social  History   Tobacco Use  . Smoking status: Former Smoker    Types: Cigarettes    Quit date: 1980    Years since quitting: 41.1  . Smokeless tobacco: Never Used  Substance Use Topics  . Alcohol use: Yes    Comment: Rarely   . Drug use: No    Home Medications Prior to Admission medications   Medication Sig Start Date End Date Taking? Authorizing Provider  acetaminophen (TYLENOL) 500 MG tablet Take 500 mg by mouth 2 (two) times daily.   Yes [provider]  lactose free nutrition (BOOST) LIQD Take 237 mLs by mouth 2 (two) times daily. Per request of resident or family    Yes [provider]  Melatonin 3 MG TABS Take 3 mg by mouth at bedtime.   Yes [provider]  methimazole (TAPAZOLE) 5 MG tablet Take 5 mg by mouth See admin instructions. Take 5 mg by mouth on every day except Sunday 04/20/15  Yes [provider]  NON FORMULARY Apply 1 mL topically 2 (two) times daily. Lorazepam PLO gel - Apply 1 ml (0.5 mg) topically twice daily for agitation   Yes [provider]  cephALEXin (KEFLEX) 500 MG capsule Take 1 capsule (500 mg total) by mouth 3 (three) times daily. Patient not taking: Reported on 06/23/2019 07/16/18  Lorre Nick, MD    Allergies    Jonne Ply [aspirin]  Review of Systems   Review of Systems  Unable to perform ROS: Dementia    Physical Exam Updated Vital Signs BP 138/78   Pulse 72   Temp 97.9 F (36.6 C) (Oral)   Resp 18   SpO2 100%   Physical Exam Vitals and nursing note reviewed.  Constitutional:      General: She is not in acute distress.    Appearance: She is well-developed.  HENT:     Head: Normocephalic.     Comments: Less than 1 cm superficial laceration over left forehead, no active bleeding Eyes:     Conjunctiva/sclera: Conjunctivae normal.  Cardiovascular:     Rate and Rhythm: Normal rate and regular rhythm.     Heart sounds: No murmur.  Pulmonary:     Effort: Pulmonary effort is normal. No respiratory  distress.     Breath sounds: Normal breath sounds.  Abdominal:     Palpations: Abdomen is soft.     Tenderness: There is no abdominal tenderness.  Musculoskeletal:     Cervical back: Neck supple.  Skin:    General: Skin is warm and dry.     Capillary Refill: Capillary refill takes less than 2 seconds.  Neurological:     General: No focal deficit present.     Mental Status: She is alert. She is disoriented.  Psychiatric:        Mood and Affect: Mood normal.        Behavior: Behavior normal.     ED Results / Procedures / Treatments   Labs (all labs ordered are listed, but only abnormal results are displayed) Labs Reviewed  CBC WITH DIFFERENTIAL/PLATELET - Abnormal; Notable for the following components:      Result Value   RBC 3.40 (*)    Hemoglobin 10.6 (*)    HCT 32.7 (*)    All other components within normal limits  BASIC METABOLIC PANEL - Abnormal; Notable for the following components:   Calcium 8.7 (*)    All other components within normal limits    EKG None  Radiology CT Head Wo Contrast  Result Date: 07/09/2019 CLINICAL DATA:  Head trauma, headache. Neck pain, acute, no red flags, fall, neck pain. Additional history provided: Witnessed fall, tripped over another patient, struck head, laceration over left eyebrow, history of Alzheimer's/dementia. EXAM: CT HEAD WITHOUT CONTRAST CT CERVICAL SPINE WITHOUT CONTRAST TECHNIQUE: Multidetector CT imaging of the head and cervical spine was performed following the standard protocol without intravenous contrast. Multiplanar CT image reconstructions of the cervical spine were also generated. COMPARISON:  Cervical spine MRI 04/13/2015, CT cervical spine 04/13/2015. FINDINGS: CT HEAD FINDINGS Brain: The examination is moderately motion degraded, limiting evaluation for acute intracranial abnormality and for calvarial fracture. Within this limitation, no acute intracranial hemorrhage or acute demarcated cortical infarction is identified.  No evidence of intracranial mass. No midline shift or extra-axial fluid collection. There is advanced generalized cerebral atrophy. Lateral and third ventriculomegaly is favored secondary to generalized volume loss. Ill-defined hypoattenuation within the cerebral white matter is nonspecific, but consistent with chronic small vessel ischemic disease. Vascular: No hyperdense vessel. Atherosclerotic calcifications. Skull: Within described limitations, no calvarial fracture is identified. Sinuses/Orbits: Left periorbital soft tissue swelling/hematoma. Visualized orbits otherwise unremarkable. Mild ethmoid sinus mucosal thickening. No significant mastoid effusion. CT CERVICAL SPINE FINDINGS Examination significantly limited by moderate to moderately severe motion degradation. Alignment: 2 mm C3-C4 anterolisthesis. Skull base and vertebrae: Within  described limitations, the basion-dental and atlanto-dental intervals appear maintained. A fracture involving the left C2 transverse foramen is new as compared to prior examination 04/13/2015, although not definitively acute (series 13, image 31) (series 11, image 32). Soft tissues and spinal canal: No prevertebral fluid or swelling. No visible canal hematoma. Disc levels: Redemonstrated sequela of previous C5-C7 ACDF. No definite hardware compromise. Cervical spondylosis. A C4-C5 posterior disc osteophyte complex contributes to at least mild/moderate spinal canal narrowing. Severe bilateral neural foraminal narrowing also present at this level. Upper chest: Ill-defined opacity within the right lung apex is incompletely imaged and incompletely assessed but previously characterized as most consistent with scarring. Impressions #1 and #2 under the CT cervical spine findings section below will be called to the ordering clinician or representative by the Radiologist Assistant, and communication documented in the PACS or zVision Dashboard. IMPRESSION: CT head: 1. Moderately motion  degraded examination, limiting evaluation for acute intracranial abnormality and for calvarial fracture. 2. No evidence of acute intracranial abnormality. 3. Advanced generalized cerebral atrophy. Lateral third ventriculomegaly is favored secondary to generalized parenchymal volume loss. 4. Chronic small vessel ischemic disease. 5. Left periorbital hematoma. CT cervical spine: 1. Examination significantly limited by moderate to moderately severe motion degradation. Consider a repeat examination when the patient is better able to tolerate the study, as clinically warranted. 2. A fracture traversing the C2 left transverse foramen is new as compared to CT cervical spine 04/13/2015 although not definitively acute. Consider CTA of the neck to assess for injury to the left vertebral artery, as clinically warranted. 3. No other cervical spine fracture is definitively identified. 4. Sequela of prior C5-C7 ACDF.  No evidence of hardware compromise. 5. Cervical spondylosis greatest at C4-C5, as described. Electronically Signed   By: Jackey Loge DO   On: 07/09/2019 16:17   CT Cervical Spine Wo Contrast  Result Date: 07/09/2019 CLINICAL DATA:  Head trauma, headache. Neck pain, acute, no red flags, fall, neck pain. Additional history provided: Witnessed fall, tripped over another patient, struck head, laceration over left eyebrow, history of Alzheimer's/dementia. EXAM: CT HEAD WITHOUT CONTRAST CT CERVICAL SPINE WITHOUT CONTRAST TECHNIQUE: Multidetector CT imaging of the head and cervical spine was performed following the standard protocol without intravenous contrast. Multiplanar CT image reconstructions of the cervical spine were also generated. COMPARISON:  Cervical spine MRI 04/13/2015, CT cervical spine 04/13/2015. FINDINGS: CT HEAD FINDINGS Brain: The examination is moderately motion degraded, limiting evaluation for acute intracranial abnormality and for calvarial fracture. Within this limitation, no acute  intracranial hemorrhage or acute demarcated cortical infarction is identified. No evidence of intracranial mass. No midline shift or extra-axial fluid collection. There is advanced generalized cerebral atrophy. Lateral and third ventriculomegaly is favored secondary to generalized volume loss. Ill-defined hypoattenuation within the cerebral white matter is nonspecific, but consistent with chronic small vessel ischemic disease. Vascular: No hyperdense vessel. Atherosclerotic calcifications. Skull: Within described limitations, no calvarial fracture is identified. Sinuses/Orbits: Left periorbital soft tissue swelling/hematoma. Visualized orbits otherwise unremarkable. Mild ethmoid sinus mucosal thickening. No significant mastoid effusion. CT CERVICAL SPINE FINDINGS Examination significantly limited by moderate to moderately severe motion degradation. Alignment: 2 mm C3-C4 anterolisthesis. Skull base and vertebrae: Within described limitations, the basion-dental and atlanto-dental intervals appear maintained. A fracture involving the left C2 transverse foramen is new as compared to prior examination 04/13/2015, although not definitively acute (series 13, image 31) (series 11, image 32). Soft tissues and spinal canal: No prevertebral fluid or swelling. No visible canal hematoma. Disc levels: Redemonstrated sequela  of previous C5-C7 ACDF. No definite hardware compromise. Cervical spondylosis. A C4-C5 posterior disc osteophyte complex contributes to at least mild/moderate spinal canal narrowing. Severe bilateral neural foraminal narrowing also present at this level. Upper chest: Ill-defined opacity within the right lung apex is incompletely imaged and incompletely assessed but previously characterized as most consistent with scarring. Impressions #1 and #2 under the CT cervical spine findings section below will be called to the ordering clinician or representative by the Radiologist Assistant, and communication documented  in the PACS or zVision Dashboard. IMPRESSION: CT head: 1. Moderately motion degraded examination, limiting evaluation for acute intracranial abnormality and for calvarial fracture. 2. No evidence of acute intracranial abnormality. 3. Advanced generalized cerebral atrophy. Lateral third ventriculomegaly is favored secondary to generalized parenchymal volume loss. 4. Chronic small vessel ischemic disease. 5. Left periorbital hematoma. CT cervical spine: 1. Examination significantly limited by moderate to moderately severe motion degradation. Consider a repeat examination when the patient is better able to tolerate the study, as clinically warranted. 2. A fracture traversing the C2 left transverse foramen is new as compared to CT cervical spine 04/13/2015 although not definitively acute. Consider CTA of the neck to assess for injury to the left vertebral artery, as clinically warranted. 3. No other cervical spine fracture is definitively identified. 4. Sequela of prior C5-C7 ACDF.  No evidence of hardware compromise. 5. Cervical spondylosis greatest at C4-C5, as described. Electronically Signed   By: Jackey Loge DO   On: 07/09/2019 16:17    Procedures .Marland KitchenLaceration Repair  Date/Time: 07/09/2019 4:46 PM Performed by: Milagros Loll, MD Authorized by: Milagros Loll, MD   Laceration details:    Location:  Scalp   Scalp location:  Frontal   Length (cm):  0.5 Repair type:    Repair type:  Simple Treatment:    Amount of cleaning:  Standard   Irrigation solution:  Sterile saline Skin repair:    Repair method:  Steri-Strips   Number of Steri-Strips:  1 Approximation:    Approximation:  Close Post-procedure details:    Dressing:  Open (no dressing)   Patient tolerance of procedure:  Tolerated well, no immediate complications   (including critical care time)  Medications Ordered in ED Medications - No data to display  ED Course  I have reviewed the triage vital signs and the nursing  notes.  Pertinent labs & imaging results that were available during my care of the patient were reviewed by me and considered in my medical decision making (see chart for details).  Clinical Course as of Jul 08 1716  Tue Jul 09, 2019  1651 Possible C2 fx, will d/w nsgy   [RD]  1716 D/w NSGY, will need angio, if arteries okay, then soft collar and outpt f/u   [RD]  1716 Signed out to Madilyn Hook   [RD]    Clinical Course User Index [RD] Milagros Loll, MD   MDM Rules/Calculators/A&P                      79 year old lady from facility, baseline dementia, witnessed fall.  Here only trauma noted was to forehead, superficial laceration, provided local wound care, repaired with Steri-Strip.  CT head negative, CT C-spine with fracture at C2 left transverse foramen.  Neurosurgery recommending CT angio to rule out vertebral artery injury.  If negative, soft collar, outpatient respiratory follow-up.  While awaiting CTA, patient signed out to Dr. Madilyn Hook.   Final Clinical Impression(s) / ED Diagnoses Final diagnoses:  Injury of head, initial encounter  Closed nondisplaced fracture of second cervical vertebra, unspecified fracture morphology, initial encounter [facility])    Rx / DC Orders ED Discharge Orders    None       Lucrezia Starch, MD 07/09/19 1718

## 2019-07-09 NOTE — ED Triage Notes (Signed)
EMS reports from [facility], witnessed fall, tripped over another patient, struck head, laceration over left eyebrow. Hx of Alzheimer's/Dementia Pt uncommunicative at baseline.  BP 136/74 HR 68 RR 20 Sp02 98 RA Temp 97.2

## 2019-07-09 NOTE — ED Notes (Signed)
Gave pt's husband update.

## 2019-07-11 DIAGNOSIS — R2681 Unsteadiness on feet: Secondary | ICD-10-CM | POA: Diagnosis not present

## 2019-07-11 DIAGNOSIS — R2689 Other abnormalities of gait and mobility: Secondary | ICD-10-CM | POA: Diagnosis not present

## 2019-07-12 ENCOUNTER — Other Ambulatory Visit: Payer: Self-pay

## 2019-07-12 ENCOUNTER — Encounter (HOSPITAL_COMMUNITY): Payer: Self-pay | Admitting: Emergency Medicine

## 2019-07-12 ENCOUNTER — Emergency Department (HOSPITAL_COMMUNITY): Payer: Medicare HMO

## 2019-07-12 ENCOUNTER — Emergency Department (HOSPITAL_COMMUNITY)
Admission: EM | Admit: 2019-07-12 | Discharge: 2019-07-13 | Disposition: A | Discharge status: Home / Self Care | Payer: Medicare HMO | Attending: Emergency Medicine | Admitting: Emergency Medicine

## 2019-07-12 DIAGNOSIS — Z79899 Other long term (current) drug therapy: Secondary | ICD-10-CM | POA: Insufficient documentation

## 2019-07-12 DIAGNOSIS — W07XXXA Fall from chair, initial encounter: Secondary | ICD-10-CM | POA: Diagnosis not present

## 2019-07-12 DIAGNOSIS — W19XXXA Unspecified fall, initial encounter: Secondary | ICD-10-CM

## 2019-07-12 DIAGNOSIS — I252 Old myocardial infarction: Secondary | ICD-10-CM | POA: Diagnosis not present

## 2019-07-12 DIAGNOSIS — E039 Hypothyroidism, unspecified: Secondary | ICD-10-CM | POA: Insufficient documentation

## 2019-07-12 DIAGNOSIS — Y92129 Unspecified place in nursing home as the place of occurrence of the external cause: Secondary | ICD-10-CM | POA: Insufficient documentation

## 2019-07-12 DIAGNOSIS — Y939 Activity, unspecified: Secondary | ICD-10-CM | POA: Diagnosis not present

## 2019-07-12 DIAGNOSIS — S01511A Laceration without foreign body of lip, initial encounter: Secondary | ICD-10-CM | POA: Insufficient documentation

## 2019-07-12 DIAGNOSIS — Z87891 Personal history of nicotine dependence: Secondary | ICD-10-CM | POA: Insufficient documentation

## 2019-07-12 DIAGNOSIS — S0990XA Unspecified injury of head, initial encounter: Secondary | ICD-10-CM

## 2019-07-12 DIAGNOSIS — Y999 Unspecified external cause status: Secondary | ICD-10-CM | POA: Diagnosis not present

## 2019-07-12 DIAGNOSIS — R404 Transient alteration of awareness: Secondary | ICD-10-CM | POA: Diagnosis not present

## 2019-07-12 DIAGNOSIS — F0390 Unspecified dementia without behavioral disturbance: Secondary | ICD-10-CM | POA: Diagnosis not present

## 2019-07-12 DIAGNOSIS — S199XXA Unspecified injury of neck, initial encounter: Secondary | ICD-10-CM | POA: Diagnosis not present

## 2019-07-12 DIAGNOSIS — R69 Illness, unspecified: Secondary | ICD-10-CM | POA: Diagnosis not present

## 2019-07-12 LAB — CBC WITH DIFFERENTIAL/PLATELET
Abs Immature Granulocytes: 0.02 10*3/uL (ref 0.00–0.07)
Basophils Absolute: 0 10*3/uL (ref 0.0–0.1)
Basophils Relative: 1 %
Eosinophils Absolute: 0.1 10*3/uL (ref 0.0–0.5)
Eosinophils Relative: 2 %
HCT: 33.5 % — ABNORMAL LOW (ref 36.0–46.0)
Hemoglobin: 10.6 g/dL — ABNORMAL LOW (ref 12.0–15.0)
Immature Granulocytes: 0 %
Lymphocytes Relative: 20 %
Lymphs Abs: 1 10*3/uL (ref 0.7–4.0)
MCH: 30.2 pg (ref 26.0–34.0)
MCHC: 31.6 g/dL (ref 30.0–36.0)
MCV: 95.4 fL (ref 80.0–100.0)
Monocytes Absolute: 0.5 10*3/uL (ref 0.1–1.0)
Monocytes Relative: 11 %
Neutro Abs: 3.3 10*3/uL (ref 1.7–7.7)
Neutrophils Relative %: 66 %
Platelets: 271 10*3/uL (ref 150–400)
RBC: 3.51 MIL/uL — ABNORMAL LOW (ref 3.87–5.11)
RDW: 13.3 % (ref 11.5–15.5)
WBC: 5 10*3/uL (ref 4.0–10.5)
nRBC: 0 % (ref 0.0–0.2)

## 2019-07-12 LAB — URINALYSIS, ROUTINE W REFLEX MICROSCOPIC
Bilirubin Urine: NEGATIVE
Glucose, UA: NEGATIVE mg/dL
Hgb urine dipstick: NEGATIVE
Ketones, ur: NEGATIVE mg/dL
Leukocytes,Ua: NEGATIVE
Nitrite: NEGATIVE
Protein, ur: NEGATIVE mg/dL
Specific Gravity, Urine: 1.024 (ref 1.005–1.030)
pH: 5 (ref 5.0–8.0)

## 2019-07-12 LAB — BASIC METABOLIC PANEL
Anion gap: 8 (ref 5–15)
BUN: 20 mg/dL (ref 8–23)
CO2: 25 mmol/L (ref 22–32)
Calcium: 8.7 mg/dL — ABNORMAL LOW (ref 8.9–10.3)
Chloride: 104 mmol/L (ref 98–111)
Creatinine, Ser: 0.59 mg/dL (ref 0.44–1.00)
GFR calc Af Amer: >60 mL/min (ref >60)
GFR calc non Af Amer: >60 mL/min (ref >60)
Glucose, Bld: 99 mg/dL (ref 70–99)
Potassium: 4.1 mmol/L (ref 3.5–5.1)
Sodium: 137 mmol/L (ref 135–145)

## 2019-07-12 MED ORDER — HALOPERIDOL LACTATE 5 MG/ML IJ SOLN
2.0000 mg | Freq: Once | INTRAMUSCULAR | Status: AC
  Administered 2019-07-12: 2 mg via INTRAVENOUS
  Filled 2019-07-12: qty 1

## 2019-07-12 NOTE — ED Triage Notes (Signed)
Arrives via EMS from [facility], witnessed fall by staff, tried to stand from the chair and fell. No LOC, some fresh blood noted to lip, teeth intact. Brusing around eyes is from previous fall.

## 2019-07-12 NOTE — ED Notes (Signed)
[facility] called for transport.  

## 2019-07-12 NOTE — Discharge Instructions (Addendum)
You were seen in the emergency department for evaluation of injuries after a fall.  And of your head and cervical spine that did not show any new findings.  You should continue to use a soft collar.  We did not repair the lip laceration has it will heal on its own and would have required restraining or sedating you to accomplish this.  You should keep the area moistened with some Vaseline.  Return to the emergency department if any worsening or concerning symptoms.

## 2019-07-12 NOTE — ED Notes (Signed)
[facility] said the patient did not stay in a C-collar at the facility, she took it off and her husband was upset that she was in it, plus there was no order for the C-collar to be continued.

## 2019-07-12 NOTE — ED Provider Notes (Signed)
[facility]-EMERGENCY DEPT Provider Note   CSN: [id number] Arrival date & time: 07/12/19  1454     History Chief Complaint  Patient presents with  . Fall    [name] is a 79 y.o. female.  She has a history of dementia and is a resident at [facility].  She was here 2 days ago for a fall striking her face.  She arrives today after another witnessed fall where she was trying to stand from a chair and fell.  No reported loss of consciousness.  She has some new bleeding to her lower lip.  Patient herself is unable to give any history, level 5 caveat.  The history is provided by the EMS personnel.  Fall This is a recurrent problem. The current episode started 1 to 2 hours ago. The problem occurs every several days. The problem has not changed since onset.Nothing aggravates the symptoms. Nothing relieves the symptoms. She has tried nothing for the symptoms. The treatment provided no relief.       Past Medical History:  Diagnosis Date  . Arthritis   . Dementia (HCC)    Alzheimers  . Depression   . H/O tubal ligation   . Hx of cervical spine surgery   . Hyperthyroidism 06-2014  . Hypothyroid   . Hypothyroidism   . Myocardial infarction (HCC)   . Osteoporosis   . Vitamin B12 deficiency     Patient Active Problem List   Diagnosis Date Noted  . Pure hypercholesterolemia 05/14/2018  . Hyperthyroidism 03/29/2018  . Weight loss 03/29/2018  . 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 Alzheimer's disease (HCC) 12/31/2012  . Other vitamin B12 deficiency anemia 12/31/2012    Past Surgical History:  Procedure Laterality Date  . BREAST SURGERY     reduction  . bunionectomies    . FINGER ARTHROPLASTY  04/02/2012   Procedure: FINGER ARTHROPLASTY;  Surgeon: Cliffton Asters. [name], MD;  Location: [facility];  Service: Orthopedics;  Laterality: Left;  Left Thumb Suspension Arthroplasty   . KYPHOPLASTY N/A 04/07/2016   Procedure: THORACIC 7  KYPHOPLASTY;  Surgeon: Estill Bamberg, MD;  Location: [facility] OR;  Service: Orthopedics;  Laterality: N/A;  THORACIC 7 KYPHOPLASTY  . REDUCTION MAMMAPLASTY    . TONSILLECTOMY    . TUBAL LIGATION       OB History   No obstetric history on file.     Family History  Problem Relation Age of Onset  . Cancer Mother        mesothelioma  . Cancer Father        mesothelioma  . Dementia Paternal Grandmother     Social History   Tobacco Use  . Smoking status: Former Smoker    Types: Cigarettes    Quit date: 1980    Years since quitting: 41.1  . Smokeless tobacco: Never Used  Substance Use Topics  . Alcohol use: Yes    Comment: Rarely   . Drug use: No    Home Medications Prior to Admission medications   Medication Sig Start Date End Date Taking? Authorizing Provider  acetaminophen (TYLENOL) 500 MG tablet Take 500 mg by mouth 2 (two) times daily.    [provider]  cephALEXin (KEFLEX) 500 MG capsule Take 1 capsule (500 mg total) by mouth 3 (three) times daily. Patient not taking: Reported on 06/23/2019 07/16/18   Lorre Nick, MD  lactose free nutrition (BOOST) LIQD Take 237 mLs by mouth 2 (two) times daily. Per request of resident  or family     [provider]  Melatonin 3 MG TABS Take 3 mg by mouth at bedtime.    [provider]  methimazole (TAPAZOLE) 5 MG tablet Take 5 mg by mouth See admin instructions. Take 5 mg by mouth on every day except Sunday 04/20/15   [provider]  NON FORMULARY Apply 1 mL topically 2 (two) times daily. Lorazepam PLO gel - Apply 1 ml (0.5 mg) topically twice daily for agitation    [provider]    Allergies    Asa [aspirin]  Review of Systems   Review of Systems  Unable to perform ROS: Dementia    Physical Exam Updated Vital Signs BP (!) 108/97 (BP Location: Right Arm)   Pulse 82   Temp 98.8 F (37.1 C) (Oral)   Resp 18   Ht 5\' 5"  (1.651 m)   Wt 63.5 kg   SpO2 98%   BMI 23.30 kg/m    Physical Exam Vitals and nursing note reviewed.  Constitutional:      General: She is not in acute distress.    Appearance: She is well-developed.  HENT:     Head: Normocephalic.     Comments: She has some old bruising around her left orbit and the laceration is healing over her left eyebrow.  She is fresh bleeding from her lower lip laceration approximately half a centimeter not crossing the vermilion border.  Possible new laceration on the upper lip 2 mm abve the vermilion border.  No obvious malocclusion.  Patient very scared and agitated and not allowing close examination. Eyes:     Conjunctiva/sclera: Conjunctivae normal.  Neck:     Comments: In cervical collar, trach midline. Cardiovascular:     Rate and Rhythm: Normal rate and regular rhythm.     Heart sounds: No murmur.  Pulmonary:     Effort: Pulmonary effort is normal. No respiratory distress.     Breath sounds: Normal breath sounds.  Abdominal:     Palpations: Abdomen is soft.     Tenderness: There is no abdominal tenderness.  Musculoskeletal:        General: No deformity or signs of injury.  Skin:    General: Skin is warm and dry.     Capillary Refill: Capillary refill takes less than 2 seconds.  Neurological:     General: No focal deficit present.     Mental Status: She is alert. Mental status is at baseline.     Comments: Patient is moving all extremities and restless trying to crawl out of the bed.  No gross deformities noted.     ED Results / Procedures / Treatments   Labs (all labs ordered are listed, but only abnormal results are displayed) Labs Reviewed  BASIC METABOLIC PANEL - Abnormal; Notable for the following components:      Result Value   Calcium 8.7 (*)    All other components within normal limits  CBC WITH DIFFERENTIAL/PLATELET - Abnormal; Notable for the following components:   RBC 3.51 (*)    Hemoglobin 10.6 (*)    HCT 33.5 (*)    All other components within normal limits  URINE CULTURE   URINALYSIS, ROUTINE W REFLEX MICROSCOPIC    EKG None  Radiology CT Head Wo Contrast  Result Date: 07/12/2019 CLINICAL DATA:  Status post fall off a chair today. Initial encounter. EXAM: CT HEAD WITHOUT CONTRAST CT CERVICAL SPINE WITHOUT CONTRAST TECHNIQUE: Multidetector CT imaging of the head and cervical spine was  performed following the standard protocol without intravenous contrast. Multiplanar CT image reconstructions of the cervical spine were also generated. COMPARISON:  Head and cervical spine CT scan 07/09/2019. FINDINGS: CT HEAD FINDINGS Brain: No evidence of acute infarction, hemorrhage, hydrocephalus, extra-axial collection or mass lesion/mass effect. Atrophy and chronic microvascular ischemic change again seen. Vascular: No hyperdense vessel or unexpected calcification. Skull: Normal. Negative for fracture or focal lesion. Sinuses/Orbits: Soft tissue contusion lateral to the left eye is again seen as on the prior CT. Otherwise negative. Other: None CT CERVICAL SPINE FINDINGS Alignment: No change in trace anterolisthesis C3 on C4 and C7 on T1 due to facet arthropathy. Skull base and vertebrae: No acute fracture. No primary bone lesion or focal pathologic process. Remote left C2 transverse process fracture is again seen. Soft tissues and spinal canal: No prevertebral fluid or swelling. No visible canal hematoma. Disc levels: Marked loss of disc space at C4-5 and endplate sclerosis are unchanged. The patient is status post C5-7 ACDF. Upper chest: Right apical scar is unchanged. Other: None. IMPRESSION: No acute abnormality head or cervical spine. Atrophy and chronic microvascular ischemic change. Remote left C2 transverse process fracture. Status post C5-7 fusion.  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 Electronically Signed   By: Inge Rise M.D.   On: 07/12/2019 16:55   CT Cervical Spine Wo Contrast  Result Date: 07/12/2019 CLINICAL DATA:  Status post fall off a chair today. Initial  encounter. EXAM: CT HEAD WITHOUT CONTRAST CT CERVICAL SPINE WITHOUT CONTRAST TECHNIQUE: Multidetector CT imaging of the head and cervical spine was performed following the standard protocol without intravenous contrast. Multiplanar CT image reconstructions of the cervical spine were also generated. COMPARISON:  Head and cervical spine CT scan 07/09/2019. FINDINGS: CT HEAD FINDINGS Brain: No evidence of acute infarction, hemorrhage, hydrocephalus, extra-axial collection or mass lesion/mass effect. Atrophy and chronic microvascular ischemic change again seen. Vascular: No hyperdense vessel or unexpected calcification. Skull: Normal. Negative for fracture or focal lesion. Sinuses/Orbits: Soft tissue contusion lateral to the left eye is again seen as on the prior CT. Otherwise negative. Other: None CT CERVICAL SPINE FINDINGS Alignment: No change in trace anterolisthesis C3 on C4 and C7 on T1 due to facet arthropathy. Skull base and vertebrae: No acute fracture. No primary bone lesion or focal pathologic process. Remote left C2 transverse process fracture is again seen. Soft tissues and spinal canal: No prevertebral fluid or swelling. No visible canal hematoma. Disc levels: Marked loss of disc space at C4-5 and endplate sclerosis are unchanged. The patient is status post C5-7 ACDF. Upper chest: Right apical scar is unchanged. Other: None. IMPRESSION: No acute abnormality head or cervical spine. Atrophy and chronic microvascular ischemic change. Remote left C2 transverse process fracture. Status post C5-7 fusion.  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 Electronically Signed   By: Inge Rise M.D.   On: 07/12/2019 16:55    Procedures Procedures (including critical care time)  Medications Ordered in ED Medications  haloperidol lactate (HALDOL) injection 2 mg (2 mg Intravenous Given 07/12/19 1534)  haloperidol lactate (HALDOL) injection 2 mg (2 mg Intravenous Given 07/12/19 1607)    ED Course  I have reviewed  the triage vital signs and the nursing notes.  Pertinent labs & imaging results that were available during my care of the patient were reviewed by me and considered in my medical decision making (see chart for details).  Clinical Course as of Jul 12 1010  Fri Jul 11, 3653  2826 79 year old here for evaluation of  injuries after a witnessed fall.  Patient with advanced dementia very restless and agitated here which is precluding a detailed exam.  The only new signs of trauma that I appreciate her inner lip laceration lower and outer lip laceration upper.    [MB]  1523 Reviewed the prior ED note and she received Haldol for sedation to get imaging done.  We will proceed with same.   [MB]  1555 Reeval patient after medication, she is resting a little more comfortably in the bed.    [MB]  1559 Patient's lab work showing hemoglobin of 10.6 similar to prior value.   [MB]  1700 CT showing no acute findings.  They comment upon a remote C2 transverse process fracture that was noted during prior visit.   [MB]    Clinical Course User Index [MB] Terrilee Files, MD   MDM Rules/Calculators/A&P                       Final Clinical Impression(s) / ED Diagnoses Final diagnoses:  Fall, initial encounter  Lip laceration, initial encounter  Minor head injury, initial encounter    Rx / DC Orders ED Discharge Orders    None       Terrilee Files, MD 07/13/19 1013

## 2019-07-13 DIAGNOSIS — R5381 Other malaise: Secondary | ICD-10-CM | POA: Diagnosis not present

## 2019-07-13 DIAGNOSIS — W19XXXA Unspecified fall, initial encounter: Secondary | ICD-10-CM | POA: Diagnosis not present

## 2019-07-13 DIAGNOSIS — M2550 Pain in unspecified joint: Secondary | ICD-10-CM | POA: Diagnosis not present

## 2019-07-13 DIAGNOSIS — Z7401 Bed confinement status: Secondary | ICD-10-CM | POA: Diagnosis not present

## 2019-07-13 LAB — URINE CULTURE: Culture: NO GROWTH
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
## 2019-07-16 DIAGNOSIS — G309 Alzheimer's disease, unspecified: Secondary | ICD-10-CM | POA: Diagnosis not present

## 2019-07-16 DIAGNOSIS — R296 Repeated falls: Secondary | ICD-10-CM | POA: Diagnosis not present

## 2019-07-16 DIAGNOSIS — S0083XA Contusion of other part of head, initial encounter: Secondary | ICD-10-CM | POA: Diagnosis not present

## 2019-07-16 DIAGNOSIS — R627 Adult failure to thrive: Secondary | ICD-10-CM | POA: Diagnosis not present

## 2019-08-21 DIAGNOSIS — E0590 Thyrotoxicosis, unspecified without thyrotoxic crisis or storm: Secondary | ICD-10-CM | POA: Diagnosis not present

## 2019-08-21 DIAGNOSIS — E039 Hypothyroidism, unspecified: Secondary | ICD-10-CM | POA: Diagnosis not present

## 2019-11-08 DIAGNOSIS — M79641 Pain in right hand: Secondary | ICD-10-CM | POA: Diagnosis not present

## 2019-11-18 DIAGNOSIS — E0590 Thyrotoxicosis, unspecified without thyrotoxic crisis or storm: Secondary | ICD-10-CM | POA: Diagnosis not present

## 2019-11-18 DIAGNOSIS — Z9183 Wandering in diseases classified elsewhere: Secondary | ICD-10-CM | POA: Diagnosis not present

## 2019-11-18 DIAGNOSIS — R627 Adult failure to thrive: Secondary | ICD-10-CM | POA: Diagnosis not present

## 2019-11-18 DIAGNOSIS — G309 Alzheimer's disease, unspecified: Secondary | ICD-10-CM | POA: Diagnosis not present

## 2019-11-27 DIAGNOSIS — E039 Hypothyroidism, unspecified: Secondary | ICD-10-CM | POA: Diagnosis not present

## 2019-12-13 DIAGNOSIS — Z20828 Contact with and (suspected) exposure to other viral communicable diseases: Secondary | ICD-10-CM | POA: Diagnosis not present

## 2019-12-26 DIAGNOSIS — H2513 Age-related nuclear cataract, bilateral: Secondary | ICD-10-CM | POA: Diagnosis not present

## 2020-01-09 DIAGNOSIS — R69 Illness, unspecified: Secondary | ICD-10-CM | POA: Diagnosis not present

## 2020-01-30 DIAGNOSIS — E039 Hypothyroidism, unspecified: Secondary | ICD-10-CM | POA: Diagnosis not present

## 2020-02-12 DIAGNOSIS — R69 Illness, unspecified: Secondary | ICD-10-CM | POA: Diagnosis not present

## 2020-03-03 DIAGNOSIS — Z20828 Contact with and (suspected) exposure to other viral communicable diseases: Secondary | ICD-10-CM | POA: Diagnosis not present

## 2020-03-11 DIAGNOSIS — Z20828 Contact with and (suspected) exposure to other viral communicable diseases: Secondary | ICD-10-CM | POA: Diagnosis not present

## 2020-04-13 ENCOUNTER — Other Ambulatory Visit: Payer: Self-pay

## 2020-04-13 ENCOUNTER — Emergency Department (HOSPITAL_COMMUNITY)
Admission: EM | Admit: 2020-04-13 | Discharge: 2020-04-13 | Disposition: A | Discharge status: Skilled Nursing Facility | Payer: Medicare HMO | Attending: Emergency Medicine | Admitting: Emergency Medicine

## 2020-04-13 ENCOUNTER — Encounter (HOSPITAL_COMMUNITY): Payer: Self-pay | Admitting: Emergency Medicine

## 2020-04-13 DIAGNOSIS — S0003XA Contusion of scalp, initial encounter: Secondary | ICD-10-CM | POA: Diagnosis not present

## 2020-04-13 DIAGNOSIS — Z87891 Personal history of nicotine dependence: Secondary | ICD-10-CM | POA: Insufficient documentation

## 2020-04-13 DIAGNOSIS — G309 Alzheimer's disease, unspecified: Secondary | ICD-10-CM | POA: Diagnosis not present

## 2020-04-13 DIAGNOSIS — R58 Hemorrhage, not elsewhere classified: Secondary | ICD-10-CM | POA: Diagnosis not present

## 2020-04-13 DIAGNOSIS — Z23 Encounter for immunization: Secondary | ICD-10-CM | POA: Insufficient documentation

## 2020-04-13 DIAGNOSIS — E039 Hypothyroidism, unspecified: Secondary | ICD-10-CM | POA: Insufficient documentation

## 2020-04-13 DIAGNOSIS — W19XXXA Unspecified fall, initial encounter: Secondary | ICD-10-CM | POA: Diagnosis not present

## 2020-04-13 DIAGNOSIS — S0001XA Abrasion of scalp, initial encounter: Secondary | ICD-10-CM | POA: Diagnosis not present

## 2020-04-13 DIAGNOSIS — Z743 Need for continuous supervision: Secondary | ICD-10-CM | POA: Diagnosis not present

## 2020-04-13 DIAGNOSIS — I959 Hypotension, unspecified: Secondary | ICD-10-CM | POA: Diagnosis not present

## 2020-04-13 DIAGNOSIS — I10 Essential (primary) hypertension: Secondary | ICD-10-CM | POA: Diagnosis not present

## 2020-04-13 DIAGNOSIS — R404 Transient alteration of awareness: Secondary | ICD-10-CM | POA: Diagnosis not present

## 2020-04-13 DIAGNOSIS — W01198A Fall on same level from slipping, tripping and stumbling with subsequent striking against other object, initial encounter: Secondary | ICD-10-CM | POA: Insufficient documentation

## 2020-04-13 DIAGNOSIS — S0990XA Unspecified injury of head, initial encounter: Secondary | ICD-10-CM | POA: Diagnosis not present

## 2020-04-13 DIAGNOSIS — R279 Unspecified lack of coordination: Secondary | ICD-10-CM | POA: Diagnosis not present

## 2020-04-13 DIAGNOSIS — Y92129 Unspecified place in nursing home as the place of occurrence of the external cause: Secondary | ICD-10-CM | POA: Diagnosis not present

## 2020-04-13 DIAGNOSIS — F0280 Dementia in other diseases classified elsewhere without behavioral disturbance: Secondary | ICD-10-CM | POA: Diagnosis not present

## 2020-04-13 DIAGNOSIS — R69 Illness, unspecified: Secondary | ICD-10-CM | POA: Diagnosis not present

## 2020-04-13 MED ORDER — TETANUS-DIPHTH-ACELL PERTUSSIS 5-2.5-18.5 LF-MCG/0.5 IM SUSY
0.5000 mL | PREFILLED_SYRINGE | Freq: Once | INTRAMUSCULAR | Status: AC
  Administered 2020-04-13: 0.5 mL via INTRAMUSCULAR
  Filled 2020-04-13: qty 0.5

## 2020-04-13 MED ORDER — BACITRACIN ZINC 500 UNIT/GM EX OINT
TOPICAL_OINTMENT | Freq: Once | CUTANEOUS | Status: AC
  Administered 2020-04-13: 1 via TOPICAL
  Filled 2020-04-13: qty 0.9
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
## 2020-04-13 NOTE — Discharge Instructions (Addendum)
It was our pleasure to provide your ER care today - we hope that you feel better.  Fall precautions.  Keep abrasions very clean.   Follow up with your doctor in the next 1-2 weeks.  Return to ER if worse, new symptoms, fevers, new or severe pain, trouble breathing, vomiting, change in mental status, or other emergency concern.
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
## 2020-04-13 NOTE — ED Notes (Signed)
Pt able to drink a few sips of water.

## 2020-04-13 NOTE — ED Notes (Signed)
Pt ambulatory with 2 person assist.

## 2020-04-13 NOTE — ED Notes (Addendum)
Attempted to call report to [facility] with no answer.

## 2020-04-13 NOTE — ED Notes (Signed)
Daughter picked up pt to transport back to facility.

## 2020-04-20 DIAGNOSIS — G309 Alzheimer's disease, unspecified: Secondary | ICD-10-CM | POA: Diagnosis not present

## 2020-04-20 DIAGNOSIS — R296 Repeated falls: Secondary | ICD-10-CM | POA: Diagnosis not present

## 2020-04-20 DIAGNOSIS — E0590 Thyrotoxicosis, unspecified without thyrotoxic crisis or storm: Secondary | ICD-10-CM | POA: Diagnosis not present

## 2020-06-20 ENCOUNTER — Emergency Department (HOSPITAL_COMMUNITY)
Admission: EM | Admit: 2020-06-20 | Discharge: 2020-06-20 | Disposition: A | Discharge status: Home / Self Care | Payer: Medicare HMO | Attending: Emergency Medicine | Admitting: Emergency Medicine

## 2020-06-20 ENCOUNTER — Emergency Department (HOSPITAL_COMMUNITY): Payer: Medicare HMO

## 2020-06-20 ENCOUNTER — Other Ambulatory Visit: Payer: Self-pay

## 2020-06-20 ENCOUNTER — Encounter (HOSPITAL_COMMUNITY): Payer: Self-pay

## 2020-06-20 DIAGNOSIS — W19XXXA Unspecified fall, initial encounter: Secondary | ICD-10-CM | POA: Diagnosis not present

## 2020-06-20 DIAGNOSIS — F0390 Unspecified dementia without behavioral disturbance: Secondary | ICD-10-CM | POA: Insufficient documentation

## 2020-06-20 DIAGNOSIS — Z87891 Personal history of nicotine dependence: Secondary | ICD-10-CM | POA: Diagnosis not present

## 2020-06-20 DIAGNOSIS — E039 Hypothyroidism, unspecified: Secondary | ICD-10-CM | POA: Diagnosis not present

## 2020-06-20 DIAGNOSIS — S0101XA Laceration without foreign body of scalp, initial encounter: Secondary | ICD-10-CM | POA: Diagnosis not present

## 2020-06-20 DIAGNOSIS — S0990XA Unspecified injury of head, initial encounter: Secondary | ICD-10-CM

## 2020-06-20 MED ORDER — LIDOCAINE-EPINEPHRINE (PF) 2 %-1:200000 IJ SOLN
10.0000 mL | Freq: Once | INTRAMUSCULAR | Status: AC
  Administered 2020-06-20: 10 mL
  Filled 2020-06-20: qty 20

## 2020-06-20 NOTE — ED Provider Notes (Signed)
[facility]-EMERGENCY DEPT Provider Note   CSN: [id number] Arrival date & time: 06/20/20  9528     History Chief Complaint  Patient presents with  . Fall  . Head Injury    [name] is a 80 y.o. female.  HPI   Patient presents to the ED for evaluation of a head injury.  History was provided by EMS as well as the patient's family member.  Patient resides at [facility].  She was standing behind a door that was opened.  Patient then stumbled and lost her balance.  Patient ended up falling backwards and hit her head.  Patient sustained a laceration.  No no loss of consciousness.  Patient is demented however and not able to provide any history.  Patient was brought to the ED for further evaluation.  Patient's family states she seems to be at her baseline.  Her mental status is what it normally is  Past Medical History:  Diagnosis Date  . Arthritis   . Dementia (HCC)    Alzheimers  . Depression   . H/O tubal ligation   . Hx of cervical spine surgery   . Hyperthyroidism 06-2014  . Hypothyroid   . Hypothyroidism   . Myocardial infarction (HCC)   . Osteoporosis   . Vitamin B12 deficiency     Patient Active Problem List   Diagnosis Date Noted  . Pure hypercholesterolemia 05/14/2018  . Hyperthyroidism 03/29/2018  . Weight loss 03/29/2018  . 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 Alzheimer's disease (HCC) 12/31/2012  . Other vitamin B12 deficiency anemia 12/31/2012    Past Surgical History:  Procedure Laterality Date  . BREAST SURGERY     reduction  . bunionectomies    . FINGER ARTHROPLASTY  04/02/2012   Procedure: FINGER ARTHROPLASTY;  Surgeon: Cliffton Asters. [name], MD;  Location: [facility];  Service: Orthopedics;  Laterality: Left;  Left Thumb Suspension Arthroplasty   . KYPHOPLASTY N/A 04/07/2016   Procedure: THORACIC 7 KYPHOPLASTY;  Surgeon: Estill Bamberg, MD;  Location: [facility] OR;  Service: Orthopedics;  Laterality: N/A;   THORACIC 7 KYPHOPLASTY  . REDUCTION MAMMAPLASTY    . TONSILLECTOMY    . TUBAL LIGATION       OB History   No obstetric history on file.     Family History  Problem Relation Age of Onset  . Cancer Mother        mesothelioma  . Cancer Father        mesothelioma  . Dementia Paternal Grandmother     Social History   Tobacco Use  . Smoking status: Former Smoker    Types: Cigarettes    Quit date: 1980    Years since quitting: 42.1  . Smokeless tobacco: Never Used  Vaping Use  . Vaping Use: Never used  Substance Use Topics  . Alcohol use: Yes    Comment: Rarely   . Drug use: No    Home Medications Prior to Admission medications   Medication Sig Start Date End Date Taking? Authorizing Provider  acetaminophen (TYLENOL) 500 MG tablet Take 500 mg by mouth 2 (two) times daily.    [provider]  cephALEXin (KEFLEX) 500 MG capsule Take 1 capsule (500 mg total) by mouth 3 (three) times daily. Patient not taking: Reported on 06/23/2019 07/16/18   Lorre Nick, MD  lactose free nutrition (BOOST) LIQD Take 237 mLs by mouth 2 (two) times daily. Per request of resident or family     [provider]  Melatonin 3 MG TABS Take 3 mg by mouth at bedtime.    [provider]  methimazole (TAPAZOLE) 5 MG tablet Take 5 mg by mouth See admin instructions. Take 5 mg by mouth on every day except Sunday 04/20/15   [provider]  NON FORMULARY Apply 1 mL topically 2 (two) times daily. Lorazepam PLO gel - Apply 1 ml (0.5 mg) topically twice daily for agitation    [provider]    Allergies    Asa [aspirin]  Review of Systems   Review of Systems  All other systems reviewed and are negative.   Physical Exam Updated Vital Signs BP (!) 144/63 (BP Location: Right Arm)   Pulse 96   Temp 98 F (36.7 C) (Oral)   Resp 18   SpO2 91%   Physical Exam Vitals and nursing note reviewed.  Constitutional:      General: She is not in acute distress.     Appearance: She is well-developed.     Comments: Elderly, frail  HENT:     Head: Normocephalic.     Comments: Laceration left posterior scalp    Right Ear: External ear normal.     Left Ear: External ear normal.  Eyes:     General: No scleral icterus.       Right eye: No discharge.        Left eye: No discharge.     Conjunctiva/sclera: Conjunctivae normal.  Neck:     Trachea: No tracheal deviation.  Cardiovascular:     Rate and Rhythm: Normal rate.  Pulmonary:     Effort: Pulmonary effort is normal. No respiratory distress.     Breath sounds: No stridor.  Abdominal:     General: There is no distension.  Musculoskeletal:        General: No swelling or deformity.     Cervical back: Neck supple.     Comments: Cervical collar in place, no cervical thoracic or lumbar spine tenderness, no apparent pain or discomfort with patient range of motion of the bilateral upper and lower extremities  Skin:    General: Skin is warm and dry.     Findings: No rash.  Neurological:     General: No focal deficit present.     Mental Status: She is alert. Mental status is at baseline.     Cranial Nerves: Cranial nerve deficit: no gross deficits.     Motor: No weakness.     Comments: Patient is alert and awake, mumbling confused speech, does not follow commands     ED Results / Procedures / Treatments   Labs (all labs ordered are listed, but only abnormal results are displayed) Labs Reviewed - No data to display  EKG None  Radiology CT Head Wo Contrast  Result Date: 06/20/2020 CLINICAL DATA:  Larey Seat and hit the back of her head. Alzheimer's disease. EXAM: CT HEAD WITHOUT CONTRAST CT CERVICAL SPINE WITHOUT CONTRAST TECHNIQUE: Multidetector CT imaging of the head and cervical spine was performed following the standard protocol without intravenous contrast. Multiplanar CT image reconstructions of the cervical spine were also generated. 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 No significant change in moderate to marked diffuse enlargement of the ventricles and subarachnoid spaces. Stable moderate patchy white matter low density in both cerebral hemispheres. No intracranial hemorrhage, mass lesion or CT evidence of acute infarction. Vascular: No hyperdense vessel or unexpected calcification. Skull: Normal. Negative for fracture  or focal lesion. Sinuses/Orbits: Unremarkable. Other: None. CT CERVICAL SPINE FINDINGS Alignment: No acute subluxations. Reversal the normal lordosis in the upper cervical spine. Skull base and vertebrae: Previously noted old left C2 transverse process fracture. No acute fractures seen. Soft tissues and spinal canal: No prevertebral fluid or swelling. No visible canal hematoma. Disc levels: Previously noted anterior hardware fusion at the C5-6 and C6-7 levels. Multilevel degenerative changes without significant change. Upper chest: Stable right upper lobe scarring. Other: None. IMPRESSION: 1. No skull fracture or intracranial hemorrhage. 2. No cervical spine fracture or subluxation. 3. 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 4. Stable moderate to marked diffuse cerebral and cerebellar atrophy. 5. Stable moderate chronic small vessel white matter ischemic changes in both cerebral hemispheres. 6. Multilevel cervical spine degenerative and postoperative changes. Electronically Signed   By: Beckie Salts M.D.   On: 06/20/2020 10:35   CT Cervical Spine Wo Contrast  Result Date: 06/20/2020 CLINICAL DATA:  Larey Seat and hit the back of her head. Alzheimer's disease. EXAM: CT HEAD WITHOUT CONTRAST CT CERVICAL SPINE WITHOUT CONTRAST TECHNIQUE: Multidetector CT imaging of the head and cervical spine was performed following the standard protocol without intravenous contrast. Multiplanar CT image reconstructions of the cervical spine were also generated. COMPARISON:  07/12/2019 FINDINGS: CT HEAD FINDINGS Brain: Interval  old right lateral basal ganglia infarct. No significant change in moderate to marked diffuse enlargement of the ventricles and subarachnoid spaces. Stable moderate patchy white matter low density in both cerebral hemispheres. No intracranial hemorrhage, mass lesion or CT evidence of acute infarction. Vascular: No hyperdense vessel or unexpected calcification. Skull: Normal. Negative for fracture or focal lesion. Sinuses/Orbits: Unremarkable. Other: None. CT CERVICAL SPINE FINDINGS Alignment: No acute subluxations. Reversal the normal lordosis in the upper cervical spine. Skull base and vertebrae: Previously noted old left C2 transverse process fracture. No acute fractures seen. Soft tissues and spinal canal: No prevertebral fluid or swelling. No visible canal hematoma. Disc levels: Previously noted anterior hardware fusion at the C5-6 and C6-7 levels. Multilevel degenerative changes without significant change. Upper chest: Stable right upper lobe scarring. Other: None. IMPRESSION: 1. No skull fracture or intracranial hemorrhage. 2. No cervical spine fracture or subluxation. 3. 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 4. Stable moderate to marked diffuse cerebral and cerebellar atrophy. 5. Stable moderate chronic small vessel white matter ischemic changes in both cerebral hemispheres. 6. Multilevel cervical spine degenerative and postoperative changes. Electronically Signed   By: Beckie Salts M.D.   On: 06/20/2020 10:35    Procedures Procedures   Medications Ordered in ED Medications  lidocaine-EPINEPHrine (XYLOCAINE W/EPI) 2 %-1:200000 (PF) injection 10 mL (has no administration in time range)    ED Course  I have reviewed the triage vital signs and the nursing notes.  Pertinent labs & imaging results that were available during my care of the patient were reviewed by me and considered in my medical decision making (see chart for details).  Clinical Course as of 06/20/20 1154  Sat Jun 20, 2020  1152 CT scans reviewed.  No acute injury noted . [JK]    Clinical Course User Index [JK] Linwood Dibbles, MD   MDM Rules/Calculators/A&P                         Pt with mechanical fall.  Advanced dementia but at baseline.  No sign of SDH, SAH, cervical spine fracture on xrays.   Laceration repair by PA [place].  Tolerated  well. Stable for dc back to nursing facility.  Family will transport her home. Final Clinical Impression(s) / ED Diagnoses Final diagnoses:  Laceration of scalp, initial encounter  Fall, initial encounter  Injury of head, initial encounter    Rx / DC Orders ED Discharge Orders    None       Linwood Dibbles, MD 06/20/20 1154

## 2020-06-20 NOTE — Discharge Instructions (Addendum)
Staple removal in 7-10 days.   (Primary MD, Urgent care)  Head injury precautions

## 2020-06-20 NOTE — ED Provider Notes (Signed)
..  Laceration Repair  Date/Time: 06/20/2020 11:05 AM Performed by: Gailen Shelter, PA Authorized by: Gailen Shelter, PA   Consent:    Consent obtained:  Verbal   Consent given by:  Patient   Risks, benefits, and alternatives were discussed: yes     Risks discussed:  Infection, need for additional repair, pain, poor cosmetic result and poor wound healing   Alternatives discussed:  No treatment and delayed treatment Universal protocol:    Procedure explained and questions answered to patient or proxy's satisfaction: yes     Relevant documents present and verified: yes     Test results available: yes     Imaging studies available: yes     Required blood products, implants, devices, and special equipment available: yes     Site/side marked: yes     Immediately prior to procedure, a time out was called: yes     Patient identity confirmed:  Verbally with patient Anesthesia:    Anesthesia method:  Local infiltration   Local anesthetic:  Lidocaine 1% WITH epi Laceration details:    Location:  Scalp   Scalp location:  Occipital   Length (cm):  2 Exploration:    Hemostasis achieved with:  Direct pressure and epinephrine   Imaging outcome: foreign body not noted     Wound exploration: wound explored through full range of motion     Wound extent: no foreign bodies/material noted and no tendon damage noted     Contaminated: no   Treatment:    Area cleansed with:  Saline   Amount of cleaning:  Standard   Irrigation solution:  Sterile saline   Irrigation volume:  100   Irrigation method:  Pressure wash   Visualized foreign bodies/material removed: no   Skin repair:    Repair method:  Staples   Number of staples:  3 Approximation:    Approximation:  Close Repair type:    Repair type:  Simple Post-procedure details:    Dressing:  Open (no dressing)   Procedure completion:  Tolerated well, no immediate complications      Gailen Shelter, PA 06/20/20 1106    Linwood Dibbles,  MD 06/21/20 [phone]

## 2020-06-20 NOTE — ED Triage Notes (Signed)
Pt presents via EMS from [facility] with c/o fall and head injury. Pt was standing behind a door and once the door opened, she stumbled back and hit her head. Pt has a laceration to the back of her head, approx one inch per EMS, bleeding controlled. C-spine in place, no crepitus or deformities noted by EMS. Pt does have alzheimer's at baseline.

## 2020-08-25 ENCOUNTER — Emergency Department (HOSPITAL_COMMUNITY)
Admission: EM | Admit: 2020-08-25 | Discharge: 2020-08-25 | Disposition: A | Discharge status: Home / Self Care | Payer: Medicare HMO | Attending: Emergency Medicine | Admitting: Emergency Medicine

## 2020-08-25 ENCOUNTER — Emergency Department (HOSPITAL_COMMUNITY): Payer: Medicare HMO

## 2020-08-25 ENCOUNTER — Encounter (HOSPITAL_COMMUNITY): Payer: Self-pay

## 2020-08-25 ENCOUNTER — Other Ambulatory Visit: Payer: Self-pay

## 2020-08-25 DIAGNOSIS — Z043 Encounter for examination and observation following other accident: Secondary | ICD-10-CM | POA: Diagnosis present

## 2020-08-25 DIAGNOSIS — S0121XA Laceration without foreign body of nose, initial encounter: Secondary | ICD-10-CM | POA: Diagnosis not present

## 2020-08-25 DIAGNOSIS — E039 Hypothyroidism, unspecified: Secondary | ICD-10-CM | POA: Insufficient documentation

## 2020-08-25 DIAGNOSIS — R410 Disorientation, unspecified: Secondary | ICD-10-CM | POA: Diagnosis not present

## 2020-08-25 DIAGNOSIS — G309 Alzheimer's disease, unspecified: Secondary | ICD-10-CM | POA: Insufficient documentation

## 2020-08-25 DIAGNOSIS — Y92129 Unspecified place in nursing home as the place of occurrence of the external cause: Secondary | ICD-10-CM | POA: Insufficient documentation

## 2020-08-25 DIAGNOSIS — F0280 Dementia in other diseases classified elsewhere without behavioral disturbance: Secondary | ICD-10-CM | POA: Insufficient documentation

## 2020-08-25 DIAGNOSIS — Z87891 Personal history of nicotine dependence: Secondary | ICD-10-CM | POA: Insufficient documentation

## 2020-08-25 DIAGNOSIS — W19XXXA Unspecified fall, initial encounter: Secondary | ICD-10-CM | POA: Insufficient documentation

## 2020-08-25 LAB — BASIC METABOLIC PANEL
Anion gap: 6 (ref 5–15)
BUN: 22 mg/dL (ref 8–23)
CO2: 27 mmol/L (ref 22–32)
Calcium: 8.9 mg/dL (ref 8.9–10.3)
Chloride: 106 mmol/L (ref 98–111)
Creatinine, Ser: 0.62 mg/dL (ref 0.44–1.00)
GFR, Estimated: >60 mL/min (ref >60)
Glucose, Bld: 100 mg/dL — ABNORMAL HIGH (ref 70–99)
Potassium: 4.3 mmol/L (ref 3.5–5.1)
Sodium: 139 mmol/L (ref 135–145)

## 2020-08-25 LAB — CBC
HCT: 36.4 % (ref 36.0–46.0)
Hemoglobin: 11.6 g/dL — ABNORMAL LOW (ref 12.0–15.0)
MCH: 30.4 pg (ref 26.0–34.0)
MCHC: 31.9 g/dL (ref 30.0–36.0)
MCV: 95.5 fL (ref 80.0–100.0)
Platelets: 251 10*3/uL (ref 150–400)
RBC: 3.81 MIL/uL — ABNORMAL LOW (ref 3.87–5.11)
RDW: 12.7 % (ref 11.5–15.5)
WBC: 10 10*3/uL (ref 4.0–10.5)
nRBC: 0 % (ref 0.0–0.2)

## 2020-08-25 NOTE — ED Notes (Signed)
Dermabond at bedside. Pt resting at this time.

## 2020-08-25 NOTE — ED Provider Notes (Signed)
[facility]-EMERGENCY DEPT Provider Note   CSN: [id number] Arrival date & time: 08/25/20  1121     History Chief complaint: [name] is a 80 y.o. female.  HPI   Patient is a resident of a skilled nursing facility.  She has history of dementia.  Patient reportedly had a witnessed fall at the facility.  She was sent to the ED for further evaluation.  Patient is rather demented and is not able to provide any history.  She does mumble and speak when I talk to her but it does not make any sense.  Past Medical History:  Diagnosis Date  . Arthritis   . Dementia (HCC)    Alzheimers  . Depression   . H/O tubal ligation   . Hx of cervical spine surgery   . Hyperthyroidism 06-2014  . Hypothyroid   . Hypothyroidism   . Myocardial infarction (HCC)   . Osteoporosis   . Vitamin B12 deficiency     Patient Active Problem List   Diagnosis Date Noted  . Pure hypercholesterolemia 05/14/2018  . Hyperthyroidism 03/29/2018  . Weight loss 03/29/2018  . 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 Alzheimer's disease (HCC) 12/31/2012  . Other vitamin B12 deficiency anemia 12/31/2012    Past Surgical History:  Procedure Laterality Date  . BREAST SURGERY     reduction  . bunionectomies    . FINGER ARTHROPLASTY  04/02/2012   Procedure: FINGER ARTHROPLASTY;  Surgeon: Cliffton Asters. [name], MD;  Location: [facility];  Service: Orthopedics;  Laterality: Left;  Left Thumb Suspension Arthroplasty   . KYPHOPLASTY N/A 04/07/2016   Procedure: THORACIC 7 KYPHOPLASTY;  Surgeon: Estill Bamberg, MD;  Location: [facility] OR;  Service: Orthopedics;  Laterality: N/A;  THORACIC 7 KYPHOPLASTY  . REDUCTION MAMMAPLASTY    . TONSILLECTOMY    . TUBAL LIGATION       OB History   No obstetric history on file.     Family History  Problem Relation Age of Onset  . Cancer Mother        mesothelioma  . Cancer Father        mesothelioma  . Dementia Paternal Grandmother      Social History   Tobacco Use  . Smoking status: Former Smoker    Types: Cigarettes    Quit date: 1980    Years since quitting: 42.2  . Smokeless tobacco: Never Used  Vaping Use  . Vaping Use: Never used  Substance Use Topics  . Alcohol use: Yes    Comment: Rarely   . Drug use: No    Home Medications Prior to Admission medications   Medication Sig Start Date End Date Taking? Authorizing Provider  acetaminophen (TYLENOL) 500 MG tablet Take 500 mg by mouth 2 (two) times daily.    [provider]  cephALEXin (KEFLEX) 500 MG capsule Take 1 capsule (500 mg total) by mouth 3 (three) times daily. Patient not taking: Reported on 06/23/2019 07/16/18   Lorre Nick, MD  lactose free nutrition (BOOST) LIQD Take 237 mLs by mouth 2 (two) times daily. Per request of resident or family     [provider]  Melatonin 3 MG TABS Take 3 mg by mouth at bedtime.    [provider]  methimazole (TAPAZOLE) 5 MG tablet Take 5 mg by mouth See admin instructions. Take 5 mg by mouth on every day except Sunday 04/20/15   [provider]  NON FORMULARY Apply 1 mL topically 2 (  two) times daily. Lorazepam PLO gel - Apply 1 ml (0.5 mg) topically twice daily for agitation    [provider]    Allergies    Asa [aspirin]  Review of Systems   Review of Systems  Unable to perform ROS: Dementia    Physical Exam Updated Vital Signs There were no vitals taken for this visit.  Physical Exam Vitals and nursing note reviewed.  Constitutional:      Appearance: She is well-developed. She is not diaphoretic.  HENT:     Head: Normocephalic.     Comments: Small laceration bridge of the nose, slight oozing of blood but no deformity    Right Ear: External ear normal.     Left Ear: External ear normal.  Eyes:     General: No scleral icterus.       Right eye: No discharge.        Left eye: No discharge.     Conjunctiva/sclera: Conjunctivae normal.  Neck:      Trachea: No tracheal deviation.  Cardiovascular:     Rate and Rhythm: Normal rate and regular rhythm.  Pulmonary:     Effort: Pulmonary effort is normal. No respiratory distress.     Breath sounds: Normal breath sounds. No stridor. No wheezing or rales.  Abdominal:     General: Bowel sounds are normal. There is no distension.     Palpations: Abdomen is soft.     Tenderness: There is no abdominal tenderness. There is no guarding or rebound.  Musculoskeletal:        General: No tenderness.     Cervical back: Neck supple.  Skin:    General: Skin is warm and dry.     Findings: No rash.  Neurological:     Mental Status: She is disoriented.     Cranial Nerves: No cranial nerve deficit (no facial droop, extraocular movements intact, no slurred speech).     Sensory: No sensory deficit.     Motor: No abnormal muscle tone or seizure activity.     Coordination: Coordination normal.     Comments: Patient mumbles and will speak to me but her speech is nonsensical, does not follow commands, moving all extremities attempting to get out of bed at times     ED Results / Procedures / Treatments   Labs (all labs ordered are listed, but only abnormal results are displayed) Labs Reviewed  CBC  BASIC METABOLIC PANEL    EKG  Normal sinus rhythm rate 64 Normal axis normal intervals Normal ST-T waves Radiology No results found.  Procedures .Marland KitchenLaceration Repair  Date/Time: 08/25/2020 3:09 PM Performed by: Linwood Dibbles, MD Authorized by: Linwood Dibbles, MD   Consent:    Consent obtained:  Verbal and emergent situation Universal protocol:    Procedure explained and questions answered to patient or proxy's satisfaction: yes     Relevant documents present and verified: yes     Test results available: yes     Imaging studies available: yes     Required blood products, implants, devices, and special equipment available: yes     Site/side marked: yes     Immediately prior to procedure, a time out was  called: yes   Anesthesia:    Anesthesia method:  None Laceration details:    Location: nose.   Length (cm):  1 Treatment:    Area cleansed with:  Shur-Clens   Debridement:  None Skin repair:    Repair method:  Tissue adhesive Approximation:  Approximation:  Close Repair type:    Repair type:  Simple Post-procedure details:    Procedure completion:  Tolerated well, no immediate complications     Medications Ordered in ED Medications - No data to display  ED Course  I have reviewed the triage vital signs and the nursing notes.  Pertinent labs & imaging results that were available during my care of the patient were reviewed by me and considered in my medical decision making (see chart for details).  Clinical Course as of 08/25/20 1507  Tue Aug 25, 2020  1428 Head face and C-spine CT without acute findings.  Fluid in sinus noticed.  Suspect this may bleed blood and most likely related to trauma and does not appear to be related to acute infection [JK]    Clinical Course User Index [JK] Linwood Dibbles, MD   MDM Rules/Calculators/A&P                          Patient presented from the nursing facility after a fall.  Patient has severe dementia so she is unable to provide any significant history.  Patient's laboratory tests are unremarkable.  CT scan does not show any signs of acute abnormalities.  Laceration was repaired with tissue adhesive.  The patient appears to be stable for discharge back to the nursing facility Final Clinical Impression(s) / ED Diagnoses Final diagnoses:  None    Rx / DC Orders ED Discharge Orders    None       Linwood Dibbles, MD 08/25/20 1510

## 2020-08-25 NOTE — ED Triage Notes (Signed)
Patient coming from nursing home ([facility]) with c/o fall. Patient had witness fall. Patient is not on blood thinner. Patient have dementia.

## 2020-08-25 NOTE — Discharge Instructions (Addendum)
Dermabond was placed on the superficial skin laceration of the nose.  Take Tylenol as needed for pain.

## 2020-08-28 IMAGING — CT CT HEAD W/O CM
3 series · 14 of 47 positions shown, 16 images · non-contrast
Comparison: Head and cervical spine CT scan 07/09/2019.

CLINICAL DATA: Status post fall off a chair today. Initial
encounter.

EXAM:
CT HEAD WITHOUT CONTRAST
CT CERVICAL SPINE WITHOUT CONTRAST
TECHNIQUE: Multidetector CT imaging of the head and cervical spine was
performed following the standard protocol without intravenous
contrast. Multiplanar CT image reconstructions of the cervical spine
were also generated.

[Series 3: head wo · axial · 0.47mm/px · z∈[-145,-20]mm · 8 of 30 slices shown, 10 images]
[im 3/30  brain]
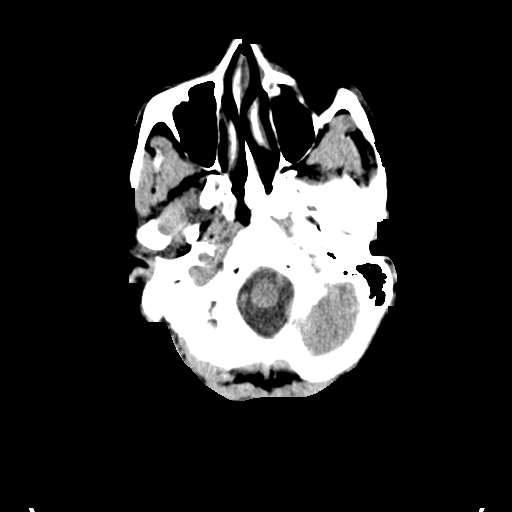
[im 3/30  bone]
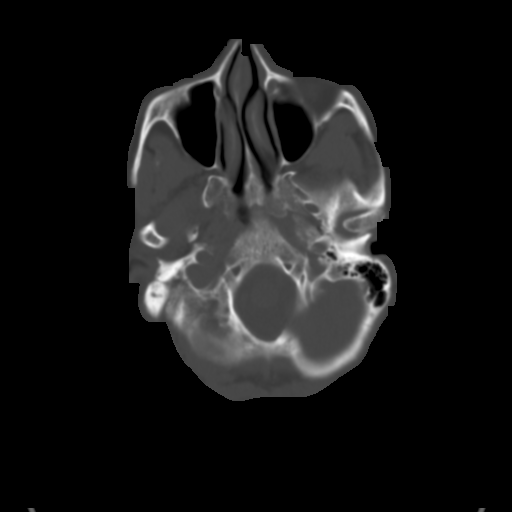
[im 7/30  brain]
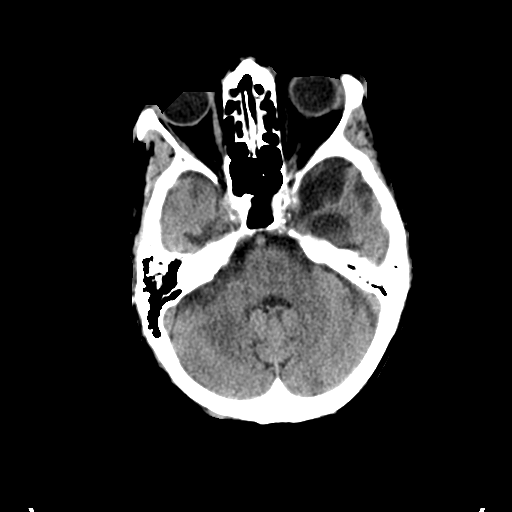
[im 10/30  brain]
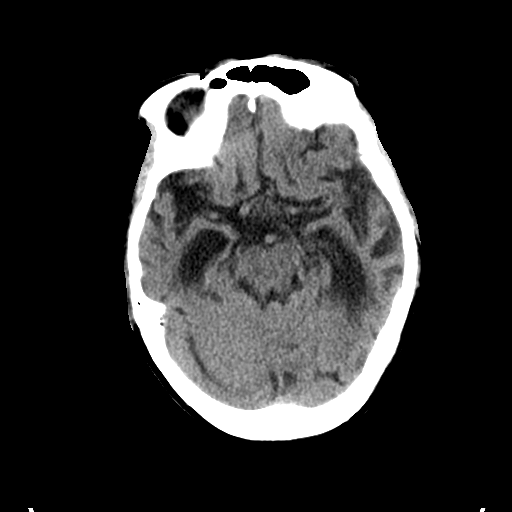
[im 14/30  brain]
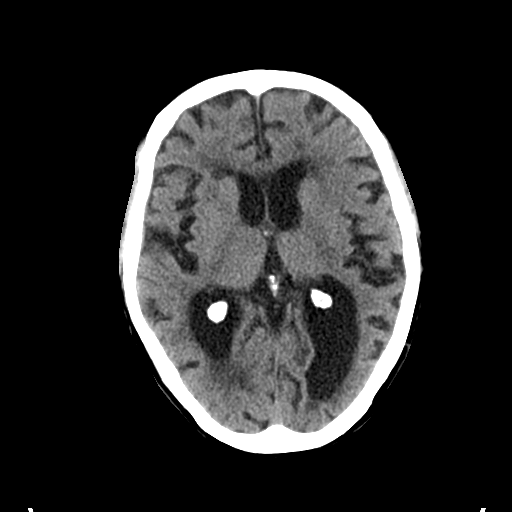
[im 17/30  brain]
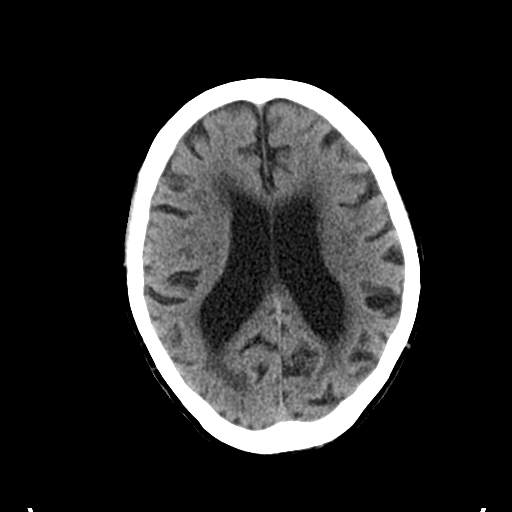
[im 17/30  bone]
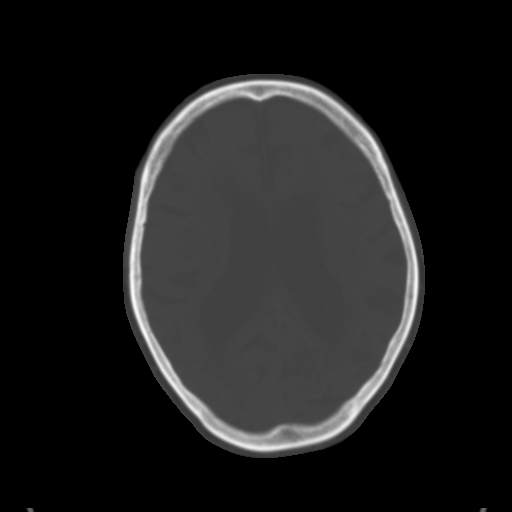
[im 21/30  brain]
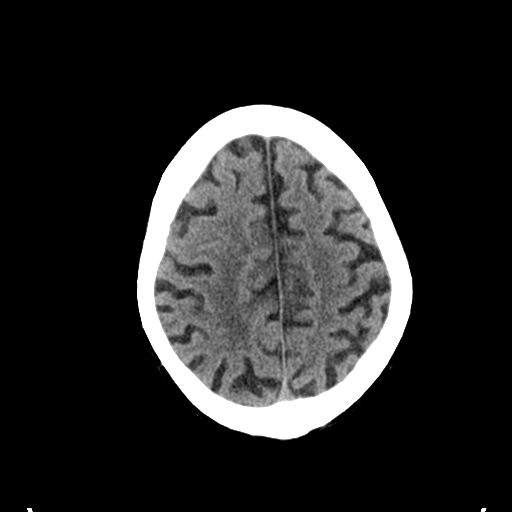
[im 24/30  brain]
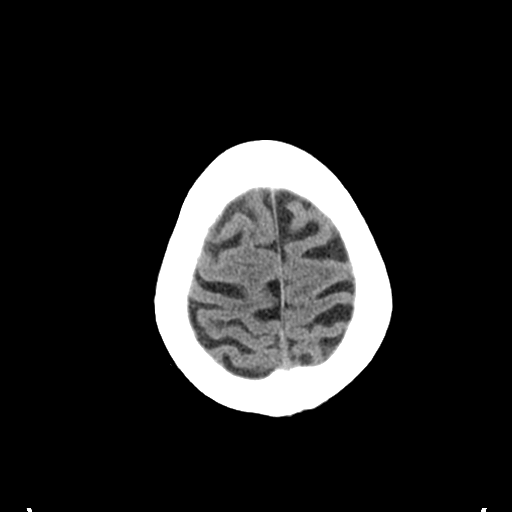
[im 28/30  brain]
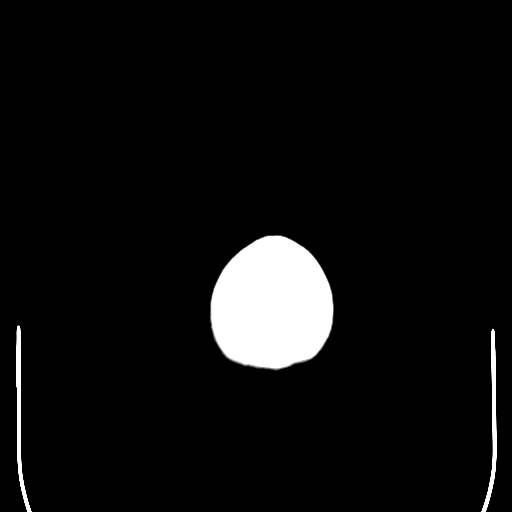

[Series 6: coronal soft tissue · coronal · 0.29mm/px · 3 of 83 slices shown]
[im 28/83  brain]
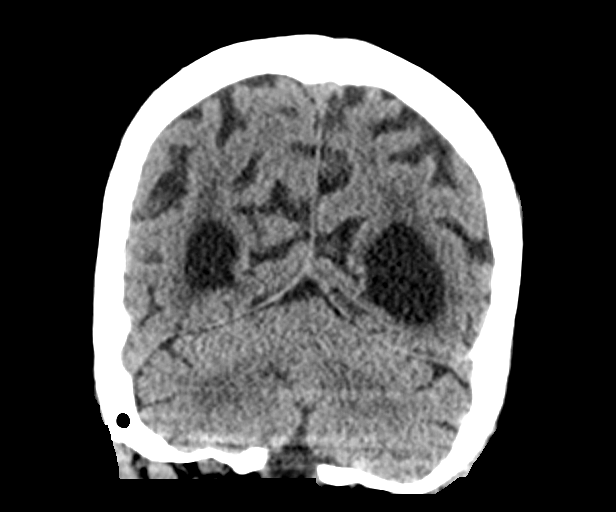
[im 37/83  brain]
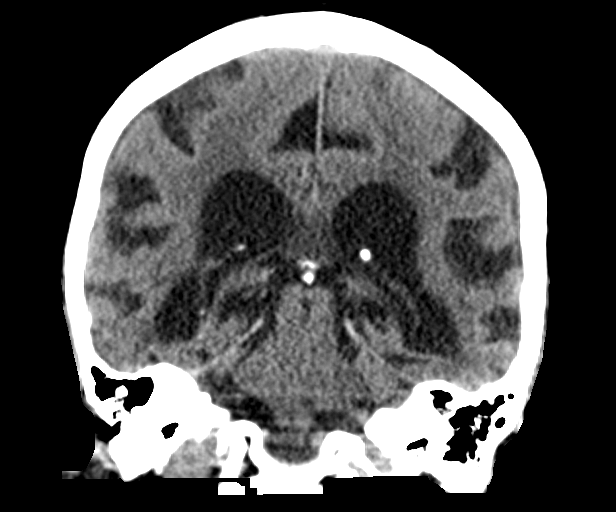
[im 46/83  brain]
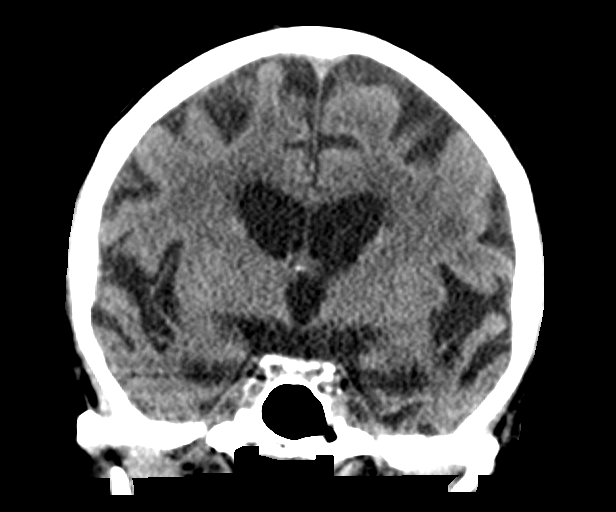

[Series 7: sagittal soft tissue · sagittal · 0.29mm/px · 3 of 56 slices shown]
[im 19/56  brain]
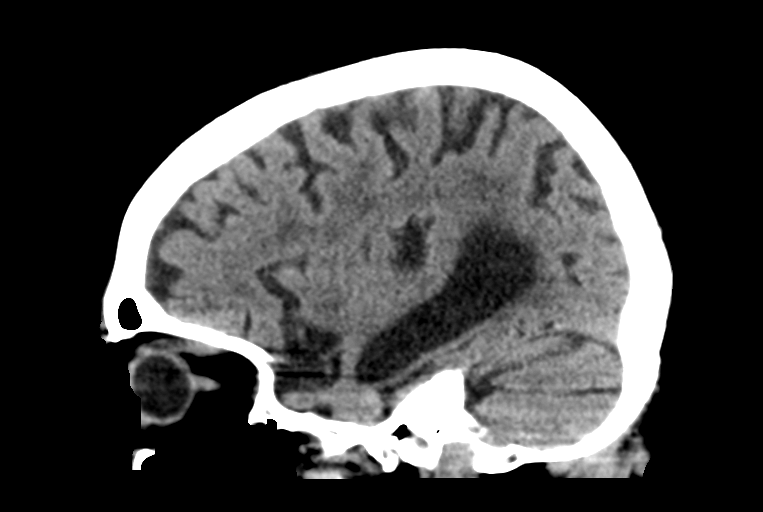
[im 28/56  brain]
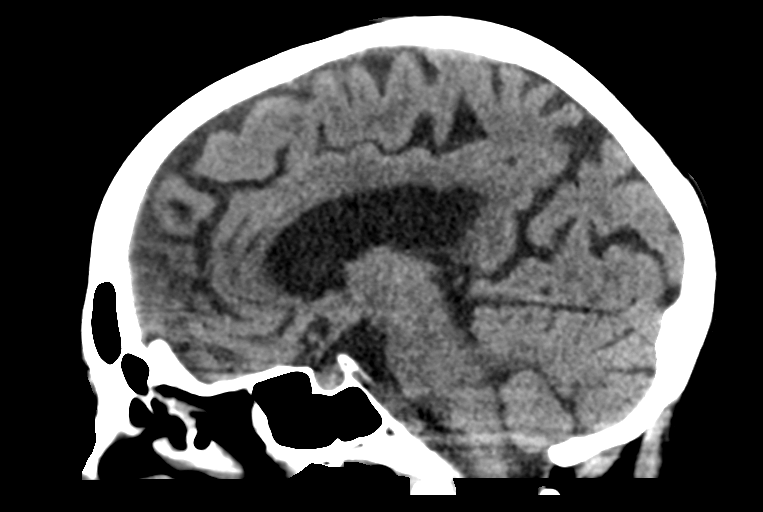
[im 37/56  brain]
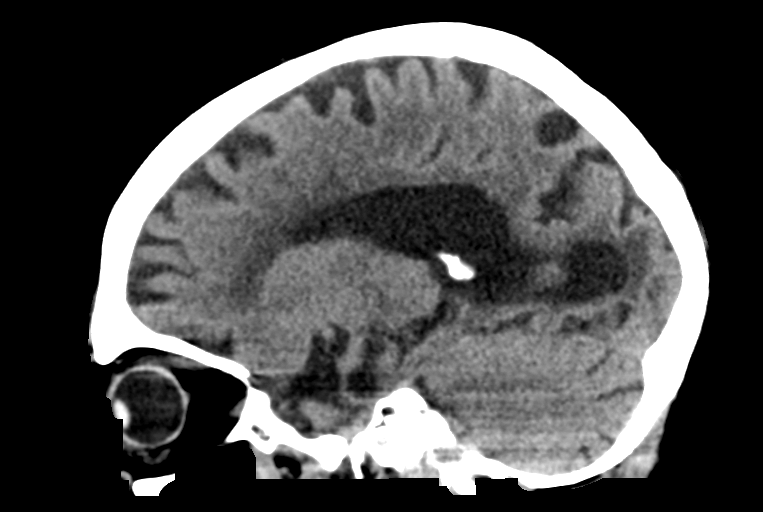

[14 of 47 positions shown; findings below may reference images not displayed]

FINDINGS: CT HEAD FINDINGS

Brain: No evidence of acute infarction, hemorrhage, hydrocephalus,
extra-axial collection or mass lesion/mass effect. Atrophy and
chronic microvascular ischemic change again seen.

Vascular: No hyperdense vessel or unexpected calcification.

Skull: Normal. Negative for fracture or focal lesion.

Sinuses/Orbits: Soft tissue contusion lateral to the left eye is
again seen as on the prior CT. Otherwise negative.

Other: None

CT CERVICAL SPINE FINDINGS

Alignment: No change in trace anterolisthesis C3 on C4 and C7 on T1
due to facet arthropathy.

Skull base and vertebrae: No acute fracture. No primary bone lesion
or focal pathologic process. Remote left C2 transverse process
fracture is again seen.

Soft tissues and spinal canal: No prevertebral fluid or swelling. No
visible canal hematoma.

Disc levels: Marked loss of disc space at C4-5 and endplate
sclerosis are unchanged. The patient is status post C5-7 ACDF.

Upper chest: Right apical scar is unchanged.

Other: None.
IMPRESSION: No acute abnormality head or cervical spine.

Atrophy and chronic microvascular ischemic change.

Remote left C2 transverse process fracture.

Status post C5-7 fusion.  Marked degenerative disc disease C4-5.

## 2020-08-28 IMAGING — CT CT CERVICAL SPINE W/O CM
4 of 8 series · 10 of 33 positions shown, 11 images · non-contrast
Comparison: Head and cervical spine CT scan 07/09/2019.

CLINICAL DATA: Status post fall off a chair today. Initial
encounter.

EXAM:
CT HEAD WITHOUT CONTRAST
CT CERVICAL SPINE WITHOUT CONTRAST
TECHNIQUE: Multidetector CT imaging of the head and cervical spine was
performed following the standard protocol without intravenous
contrast. Multiplanar CT image reconstructions of the cervical spine
were also generated.

[Series 9: orthogonal bone · axial · 0.23mm/px · z∈[-266,-201]mm · 2 of 100 slices shown, 3 images (1 of 2)]
[im 34/100  soft-tissue]
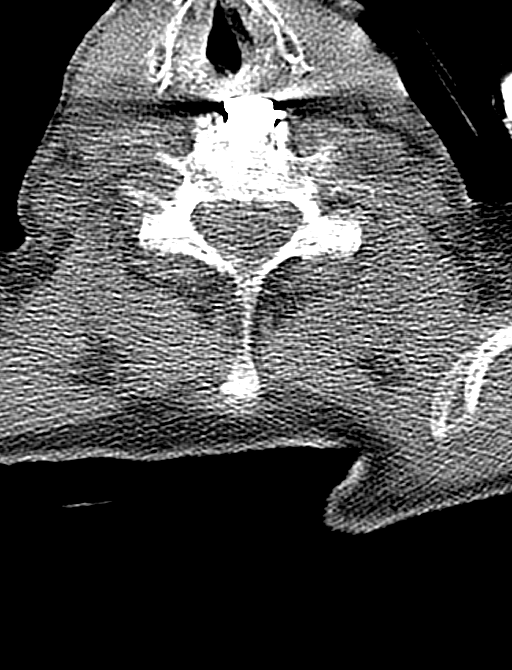
[im 34/100  bone]
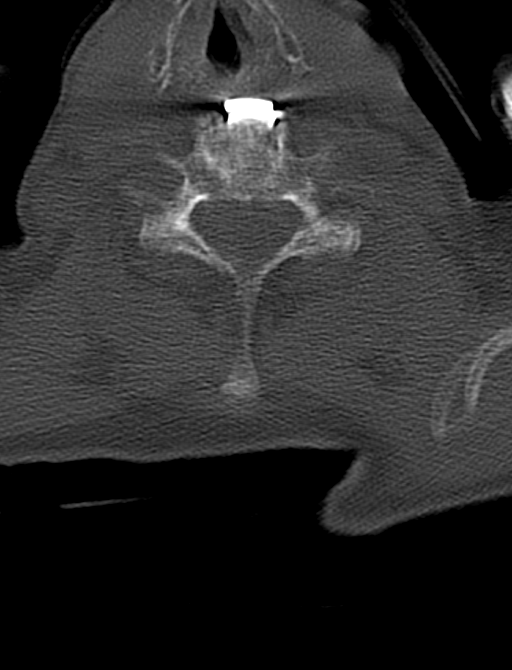
[im 67/100  bone]
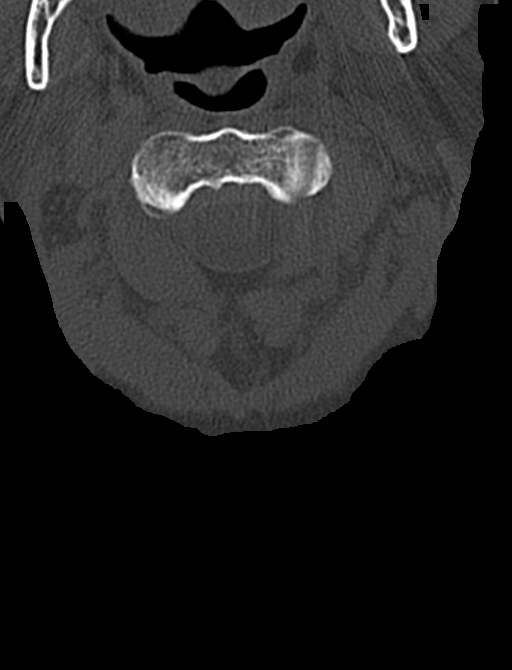

[Series 10: coronal bone · coronal · 0.25mm/px · 1 of 77 slices shown]
[im 39/77  bone]
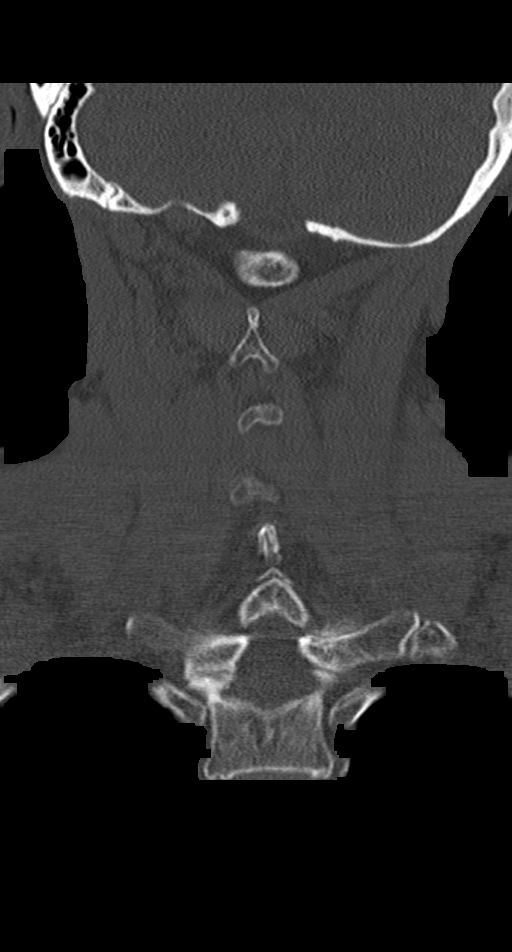

[Series 11: sagittal bone · sagittal · 0.28mm/px · 5 of 61 slices shown]
[im 11/61  bone]
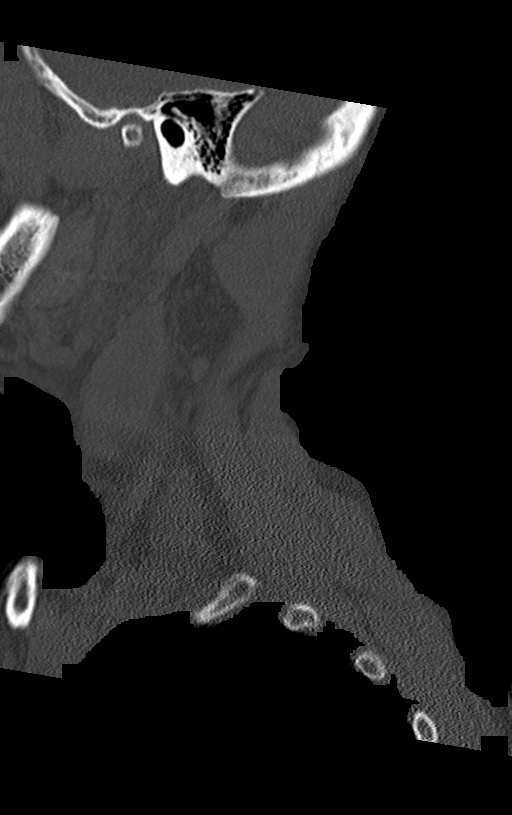
[im 21/61  bone]
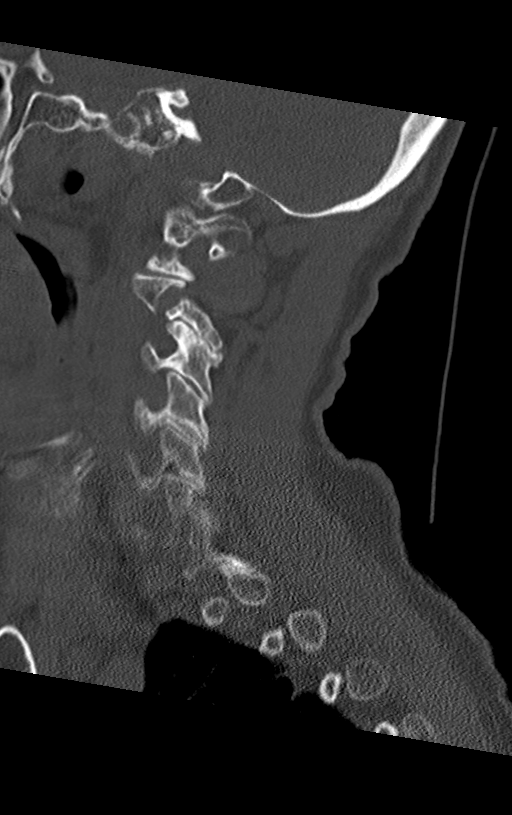
[im 31/61  bone]
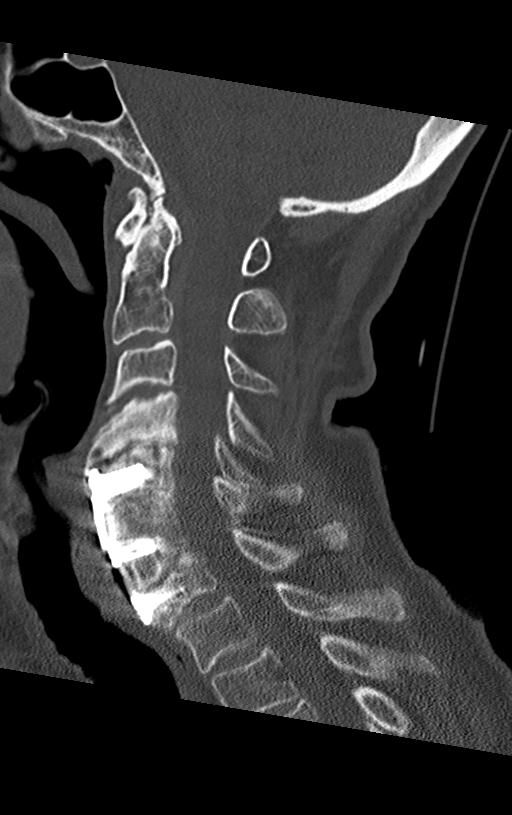
[im 41/61  bone]
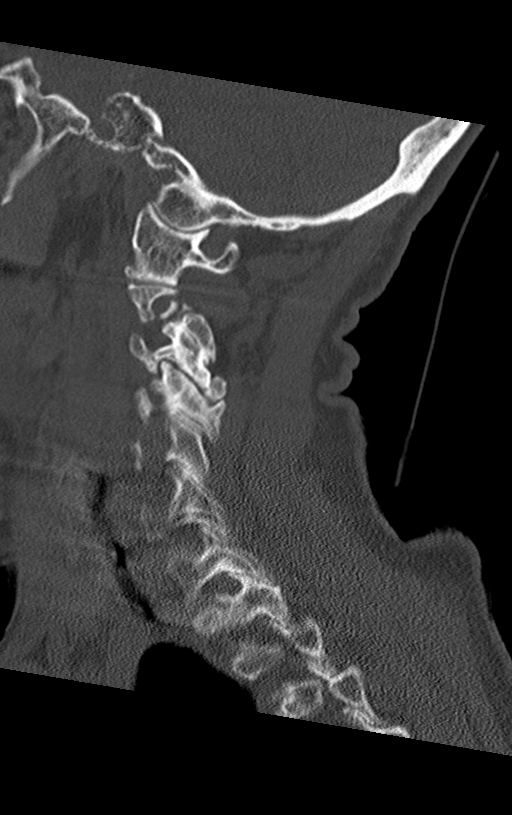
[im 51/61  bone]
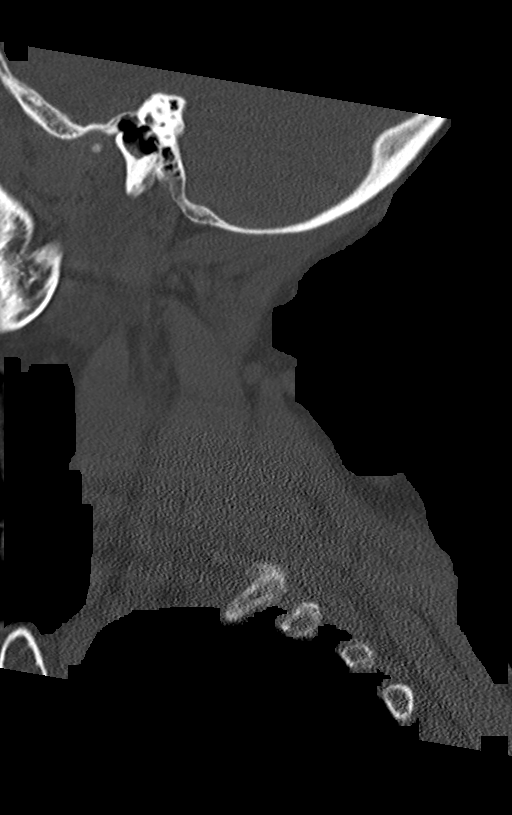

[Series 12: orthogonal bone · axial · 0.23mm/px · z∈[-264,-201]mm · 2 of 98 slices shown (2 of 2)]
[im 33/98  bone]
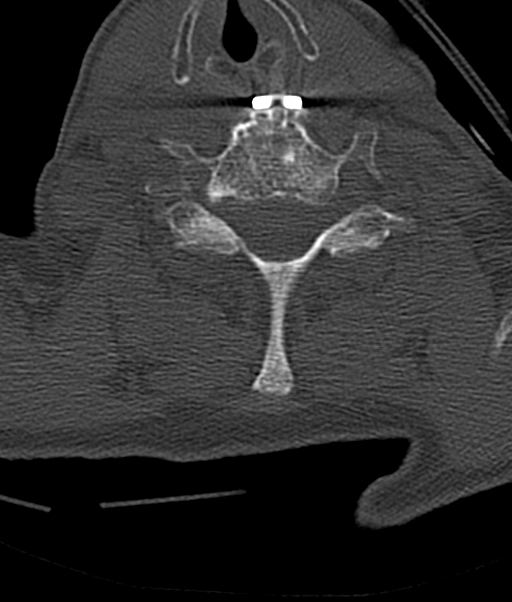
[im 65/98  bone]
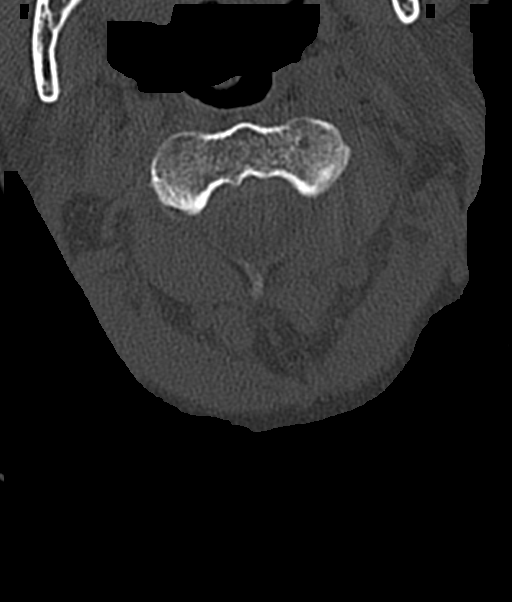

[10 of 33 positions shown; findings below may reference images not displayed]

FINDINGS: CT HEAD FINDINGS

Brain: No evidence of acute infarction, hemorrhage, hydrocephalus,
extra-axial collection or mass lesion/mass effect. Atrophy and
chronic microvascular ischemic change again seen.

Vascular: No hyperdense vessel or unexpected calcification.

Skull: Normal. Negative for fracture or focal lesion.

Sinuses/Orbits: Soft tissue contusion lateral to the left eye is
again seen as on the prior CT. Otherwise negative.

Other: None

CT CERVICAL SPINE FINDINGS

Alignment: No change in trace anterolisthesis C3 on C4 and C7 on T1
due to facet arthropathy.

Skull base and vertebrae: No acute fracture. No primary bone lesion
or focal pathologic process. Remote left C2 transverse process
fracture is again seen.

Soft tissues and spinal canal: No prevertebral fluid or swelling. No
visible canal hematoma.

Disc levels: Marked loss of disc space at C4-5 and endplate
sclerosis are unchanged. The patient is status post C5-7 ACDF.

Upper chest: Right apical scar is unchanged.

Other: None.
IMPRESSION: No acute abnormality head or cervical spine.

Atrophy and chronic microvascular ischemic change.

Remote left C2 transverse process fracture.

Status post C5-7 fusion.  Marked degenerative disc disease C4-5.

## 2020-09-17 ENCOUNTER — Emergency Department (HOSPITAL_COMMUNITY): Payer: Medicare HMO

## 2020-09-17 ENCOUNTER — Other Ambulatory Visit: Payer: Self-pay

## 2020-09-17 ENCOUNTER — Encounter (HOSPITAL_COMMUNITY): Payer: Self-pay

## 2020-09-17 ENCOUNTER — Emergency Department (HOSPITAL_COMMUNITY)
Admission: EM | Admit: 2020-09-17 | Discharge: 2020-09-17 | Disposition: A | Discharge status: Home / Self Care | Payer: Medicare HMO | Attending: Emergency Medicine | Admitting: Emergency Medicine

## 2020-09-17 DIAGNOSIS — S0990XA Unspecified injury of head, initial encounter: Secondary | ICD-10-CM | POA: Diagnosis present

## 2020-09-17 DIAGNOSIS — W19XXXA Unspecified fall, initial encounter: Secondary | ICD-10-CM | POA: Diagnosis not present

## 2020-09-17 DIAGNOSIS — S0511XA Contusion of eyeball and orbital tissues, right eye, initial encounter: Secondary | ICD-10-CM | POA: Insufficient documentation

## 2020-09-17 DIAGNOSIS — G309 Alzheimer's disease, unspecified: Secondary | ICD-10-CM | POA: Diagnosis not present

## 2020-09-17 DIAGNOSIS — E039 Hypothyroidism, unspecified: Secondary | ICD-10-CM | POA: Diagnosis not present

## 2020-09-17 DIAGNOSIS — Z87891 Personal history of nicotine dependence: Secondary | ICD-10-CM | POA: Insufficient documentation

## 2020-09-17 DIAGNOSIS — S0512XA Contusion of eyeball and orbital tissues, left eye, initial encounter: Secondary | ICD-10-CM | POA: Diagnosis not present

## 2020-09-17 MED ORDER — HALOPERIDOL LACTATE 5 MG/ML IJ SOLN
5.0000 mg | Freq: Once | INTRAMUSCULAR | Status: AC
  Administered 2020-09-17: 5 mg via INTRAMUSCULAR
  Filled 2020-09-17: qty 1

## 2020-09-17 MED ORDER — HALOPERIDOL LACTATE 5 MG/ML IJ SOLN: 5.0000 mg | Freq: Once | INTRAMUSCULAR | Status: DC

## 2020-09-17 MED ORDER — HALOPERIDOL LACTATE 5 MG/ML IJ SOLN
2.0000 mg | Freq: Once | INTRAMUSCULAR | Status: AC
  Administered 2020-09-17: 2 mg via INTRAVENOUS
  Filled 2020-09-17: qty 1

## 2020-09-17 MED ORDER — LORAZEPAM 2 MG/ML IJ SOLN
1.0000 mg | Freq: Once | INTRAMUSCULAR | Status: AC
  Administered 2020-09-17: 1 mg via INTRAVENOUS
  Filled 2020-09-17: qty 1

## 2020-09-17 NOTE — ED Provider Notes (Signed)
[facility]-EMERGENCY DEPT Provider Note   CSN: [id number] Arrival date & time: 09/17/20  1245     History Chief Complaint  Patient presents with  . Fall    [name] is a 80 y.o. female.  HPI      Level 5 caveat due to dementia.  [name] is a 80 y.o. female, with a history of dementia, arthritis, hypothyroidism, MI, osteoporosis, presenting to the ED with unwitnessed fall that reportedly occurred yesterday.  Patient is from [facility] skilled nursing facility. EMS reports patient was found on the floor yesterday.  Per the facility, patient's family did not want her taken to the hospital at that time.  Patient reportedly at her baseline mental status.      Past Medical History:  Diagnosis Date  . Arthritis   . Dementia (HCC)    Alzheimers  . Depression   . H/O tubal ligation   . Hx of cervical spine surgery   . Hyperthyroidism 06-2014  . Hypothyroid   . Hypothyroidism   . Myocardial infarction (HCC)   . Osteoporosis   . Vitamin B12 deficiency     Patient Active Problem List   Diagnosis Date Noted  . Pure hypercholesterolemia 05/14/2018  . Hyperthyroidism 03/29/2018  . Weight loss 03/29/2018  . 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 Alzheimer's disease (HCC) 12/31/2012  . Other vitamin B12 deficiency anemia 12/31/2012    Past Surgical History:  Procedure Laterality Date  . BREAST SURGERY     reduction  . bunionectomies    . FINGER ARTHROPLASTY  04/02/2012   Procedure: FINGER ARTHROPLASTY;  Surgeon: Cliffton Asters. [name], MD;  Location: [facility];  Service: Orthopedics;  Laterality: Left;  Left Thumb Suspension Arthroplasty   . KYPHOPLASTY N/A 04/07/2016   Procedure: THORACIC 7 KYPHOPLASTY;  Surgeon: Estill Bamberg, MD;  Location: [facility] OR;  Service: Orthopedics;  Laterality: N/A;  THORACIC 7 KYPHOPLASTY  . REDUCTION MAMMAPLASTY    . TONSILLECTOMY    . TUBAL LIGATION       OB History   No obstetric  history on file.     Family History  Problem Relation Age of Onset  . Cancer Mother        mesothelioma  . Cancer Father        mesothelioma  . Dementia Paternal Grandmother     Social History   Tobacco Use  . Smoking status: Former Smoker    Types: Cigarettes    Quit date: 1980    Years since quitting: 42.3  . Smokeless tobacco: Never Used  Vaping Use  . Vaping Use: Never used  Substance Use Topics  . Alcohol use: Yes    Comment: Rarely   . Drug use: No    Home Medications Prior to Admission medications   Medication Sig Start Date End Date Taking? Authorizing Provider  acetaminophen (TYLENOL) 500 MG tablet Take 500 mg by mouth 2 (two) times daily.   Yes [provider]  loratadine (CLARITIN) 10 MG tablet Take 10 mg by mouth daily.   Yes [provider]  Melatonin 3 MG TABS Take 3 mg by mouth at bedtime.   Yes [provider]  methimazole (TAPAZOLE) 5 MG tablet Take 5 mg by mouth See admin instructions. 5mg  daily on Monday Wednesday Friday Sunday   Yes [provider]  NON FORMULARY Apply 1 mL topically daily. Lorazepam PLO gel - Apply 1 ml (0.5 mg) topically daily   Yes [provider]  OVER THE COUNTER MEDICATION Take 1 Container by mouth 3 (three) times daily. Supplemental shake   Yes [provider]    Allergies    Asa [aspirin]  Review of Systems   Review of Systems  Unable to perform ROS: Dementia    Physical Exam Updated Vital Signs BP (!) 147/86   Pulse (!) 57   Temp (!) 97 F (36.1 C) (Axillary)   Resp 16   SpO2 100%   Physical Exam Vitals and nursing note reviewed.  Constitutional:      General: She is not in acute distress.    Appearance: She is well-developed. She is not diaphoretic.     Interventions: Cervical collar in place.  HENT:     Head: Normocephalic.     Comments: Bilateral, periorbital bruising without definite swelling in this region.  Bruising also noted to the left frontal  region of the scalp without noted deformity or instability. The rest of the scalp and face was examined without additional evidence of injury.    Mouth/Throat:     Mouth: Mucous membranes are moist.     Pharynx: Oropharynx is clear.     Comments: Dentition appears to be intact and stable.  No noted area of intraoral swelling.  No trismus or noted abnormal phonation.  Mouth opening to at least 3 finger widths.  Handles oral secretions without difficulty.  No noted facial swelling.  No sublingual swelling or tongue elevation.  No swelling or tenderness to the submental or submandibular regions.  No swelling or tenderness into the soft tissues of the neck. Eyes:     Conjunctiva/sclera: Conjunctivae normal.  Cardiovascular:     Rate and Rhythm: Normal rate and regular rhythm.     Pulses: Normal pulses.          Radial pulses are 2+ on the right side and 2+ on the left side.       Posterior tibial pulses are 2+ on the right side and 2+ on the left side.     Heart sounds: Normal heart sounds.     Comments: Tactile temperature in the extremities appropriate and equal bilaterally. Pulmonary:     Effort: Pulmonary effort is normal. No respiratory distress.     Breath sounds: Normal breath sounds.  Abdominal:     Palpations: Abdomen is soft.     Tenderness: There is no abdominal tenderness. There is no guarding.  Musculoskeletal:     Cervical back: Neck supple.     Right lower leg: No edema.     Left lower leg: No edema.     Comments: It seems as though patient is unreliable in indicating pain.  She moans or whimpers no matter the area being examined. Pelvis seems to be stable without noted deformity, instability, or swelling. The upper and lower extremities were examined and palpated, and the major joints of the upper and lower extremities were ranged. Overall trauma exam performed without any abnormalities noted other than those mentioned.  Lymphadenopathy:     Cervical: No cervical adenopathy.   Skin:    General: Skin is warm and dry.  Neurological:     Mental Status: She is alert.     Comments: Patient appears to be moving all of her extremities equally. She is able to grip my hands bilaterally and grip strengths appear to be equal. She will respond to light touch in each of her extremities.  Psychiatric:        Mood and Affect:  Mood and affect normal.        Speech: Speech normal.        Behavior: Behavior normal.     ED Results / Procedures / Treatments   Labs (all labs ordered are listed, but only abnormal results are displayed) Labs Reviewed - No data to display  EKG None  Radiology DG Ribs Unilateral W/Chest Right  Result Date: 09/17/2020 CLINICAL DATA:  Fall with right-sided chest tenderness. EXAM: RIGHT RIBS AND CHEST - 3+ VIEW COMPARISON:  02/23/2016 FINDINGS: Heart and mediastinal shadows are normal except for tortuosity of the aorta. The lungs are clear. No pneumothorax or hemothorax. Right rib film positioning is atypical, but I do not see evidence of a rib fracture. IMPRESSION: No active cardiopulmonary disease. No visible rib fracture. Electronically Signed   By: Paulina Fusi M.D.   On: 09/17/2020 10:05   CT Head Wo Contrast  Result Date: 09/17/2020 CLINICAL DATA:  Facial trauma. Neck trauma. Additional provided: Unwitnessed fall, bruising to face. EXAM: CT HEAD WITHOUT CONTRAST CT MAXILLOFACIAL WITHOUT CONTRAST CT CERVICAL SPINE WITHOUT CONTRAST TECHNIQUE: Multidetector CT imaging of the head, cervical spine, and maxillofacial structures were performed using the standard protocol without intravenous contrast. Multiplanar CT image reconstructions of the cervical spine and maxillofacial structures were also generated. COMPARISON:  CT head/maxillofacial/cervical spine 08/25/2020. FINDINGS: CT HEAD FINDINGS Brain: Advanced cerebral atrophy. Commensurate prominence of the ventricles and sulci. Comparatively mild cerebellar atrophy. Redemonstrated chronic small-vessel  infarct within the right corona radiata/basal ganglia. Moderate ill-defined hypoattenuation within the cerebral white matter, which is nonspecific but compatible with chronic small vessel ischemic disease. There is no acute intracranial hemorrhage. No demarcated cortical infarct. No extra-axial fluid collection. No evidence of intracranial mass. No midline shift. Vascular: No hyperdense vessel.  Atherosclerotic calcifications Skull: Normal. Negative for fracture or focal lesion. Other: Small left anterior scalp/forehead hematoma. CT MAXILLOFACIAL FINDINGS Mildly motion degraded exam. Osseous: No evidence of acute maxillofacial fracture. Orbits: No acute finding within the orbits. The globes are normal in size and contour. The extraocular muscles and optic nerve sheath complexes are symmetric and unremarkable. Sinuses: Complete opacification of the left frontal sinus. Extensive partial opacification of anterior left ethmoid air cells. Mild mucosal thickening within the right ethmoid air cells. Small volume frothy secretions within the left sphenoid sinus. Frothy secretions and severe mucosal thickening within the left maxillary sinus. Mild polypoid mucosal thickening within the right maxillary sinus. Soft tissues: Left anterior scalp, forehead and periorbital soft tissue swelling/hematoma. Other: Poor dentition with multifocal periapical lucencies CT CERVICAL SPINE FINDINGS Alignment: Cervical levocurvature with partially imaged thoracic dextrocurvature. 2 mm C7-T1 grade 1 anterolisthesis, unchanged. Skull base and vertebrae: The basion-dental and atlanto-dental intervals are maintained.No evidence of acute fracture to the cervical spine. Soft tissues and spinal canal: No prevertebral fluid or swelling. No visible canal hematoma. Disc levels: Redemonstrated sequela of prior C5-C7 ACDF. No evidence of acute hardware compromise. Cervical spondylosis. Most notably at C4-C5, there is advanced disc space narrowing with a  disc bulge, superimposed left center disc protrusion, endplate spurring, and bilateral disc osteophyte ridge/uncinate hypertrophy. Bilateral bony neural foraminal narrowing and suspected moderate/severe spinal canal stenosis at this level. Upper chest: Redemonstrated right apical pleuroparenchymal scarring. No consolidation within the imaged lung apices. No visible pneumothorax. IMPRESSION: CT head: 1. No evidence of acute intracranial abnormality. 2. Small left anterior scalp/forehead hematoma. 3. 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 4. Background moderate cerebral white matter chronic small vessel ischemic disease. 5. Advanced  cerebral atrophy with comparatively mild cerebellar atrophy. CT maxillofacial: 1. No evidence of acute maxillofacial fracture. 2. Left anterior scalp, forehead and periorbital soft tissue swelling/hematoma. 3. Fairly extensive paranasal sinus disease, as described. CT cervical spine: 1. No evidence of acute fracture to the cervical spine. 2. Cervical levocurvature with partially imaged thoracic dextrocurvature. 3. Mild C7-T1 grade 1 anterolisthesis, unchanged. 4. Redemonstrated sequela of prior C5-C7 ACDF. No evidence of acute hardware compromise. 5. Cervical spondylosis as described and greatest at C4-C5. Bilateral bony neural foraminal narrowing and suspected moderate/severe spinal canal stenosis at this level. Electronically Signed   By: Jackey Loge DO   On: 09/17/2020 13:33   CT Cervical Spine Wo Contrast  Result Date: 09/17/2020 CLINICAL DATA:  Facial trauma. Neck trauma. Additional provided: Unwitnessed fall, bruising to face. EXAM: CT HEAD WITHOUT CONTRAST CT MAXILLOFACIAL WITHOUT CONTRAST CT CERVICAL SPINE WITHOUT CONTRAST TECHNIQUE: Multidetector CT imaging of the head, cervical spine, and maxillofacial structures were performed using the standard protocol without intravenous contrast. Multiplanar CT image reconstructions of  the cervical spine and maxillofacial structures were also generated. COMPARISON:  CT head/maxillofacial/cervical spine 08/25/2020. FINDINGS: CT HEAD FINDINGS Brain: Advanced cerebral atrophy. Commensurate prominence of the ventricles and sulci. Comparatively mild cerebellar atrophy. 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 Moderate ill-defined hypoattenuation within the cerebral white matter, which is nonspecific but compatible with chronic small vessel ischemic disease. There is no acute intracranial hemorrhage. No demarcated cortical infarct. No extra-axial fluid collection. No evidence of intracranial mass. No midline shift. Vascular: No hyperdense vessel.  Atherosclerotic calcifications Skull: Normal. Negative for fracture or focal lesion. Other: Small left anterior scalp/forehead hematoma. CT MAXILLOFACIAL FINDINGS Mildly motion degraded exam. Osseous: No evidence of acute maxillofacial fracture. Orbits: No acute finding within the orbits. The globes are normal in size and contour. The extraocular muscles and optic nerve sheath complexes are symmetric and unremarkable. Sinuses: Complete opacification of the left frontal sinus. Extensive partial opacification of anterior left ethmoid air cells. Mild mucosal thickening within the right ethmoid air cells. Small volume frothy secretions within the left sphenoid sinus. Frothy secretions and severe mucosal thickening within the left maxillary sinus. Mild polypoid mucosal thickening within the right maxillary sinus. Soft tissues: Left anterior scalp, forehead and periorbital soft tissue swelling/hematoma. Other: Poor dentition with multifocal periapical lucencies CT CERVICAL SPINE FINDINGS Alignment: Cervical levocurvature with partially imaged thoracic dextrocurvature. 2 mm C7-T1 grade 1 anterolisthesis, unchanged. Skull base and vertebrae: The basion-dental and atlanto-dental intervals are maintained.No evidence of  acute fracture to the cervical spine. Soft tissues and spinal canal: No prevertebral fluid or swelling. No visible canal hematoma. Disc levels: Redemonstrated sequela of prior C5-C7 ACDF. No evidence of acute hardware compromise. Cervical spondylosis. Most notably at C4-C5, there is advanced disc space narrowing with a disc bulge, superimposed left center disc protrusion, endplate spurring, and bilateral disc osteophyte ridge/uncinate hypertrophy. Bilateral bony neural foraminal narrowing and suspected moderate/severe spinal canal stenosis at this level. Upper chest: Redemonstrated right apical pleuroparenchymal scarring. No consolidation within the imaged lung apices. No visible pneumothorax. IMPRESSION: CT head: 1. No evidence of acute intracranial abnormality. 2. Small left anterior scalp/forehead hematoma. 3. 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 4. Background moderate cerebral white matter chronic small vessel ischemic disease. 5. Advanced cerebral atrophy with comparatively mild cerebellar atrophy. CT maxillofacial: 1. No evidence of acute maxillofacial fracture. 2. Left anterior scalp, forehead and periorbital soft tissue swelling/hematoma. 3. Fairly extensive paranasal sinus disease, as described. CT  cervical spine: 1. No evidence of acute fracture to the cervical spine. 2. Cervical levocurvature with partially imaged thoracic dextrocurvature. 3. Mild C7-T1 grade 1 anterolisthesis, unchanged. 4. Redemonstrated sequela of prior C5-C7 ACDF. No evidence of acute hardware compromise. 5. Cervical spondylosis as described and greatest at C4-C5. Bilateral bony neural foraminal narrowing and suspected moderate/severe spinal canal stenosis at this level. Electronically Signed   By: Jackey Loge DO   On: 09/17/2020 13:33   DG Hips Bilat W or Wo Pelvis 3-4 Views  Result Date: 09/17/2020 CLINICAL DATA:  Fall; dementia. EXAM: DG HIP (WITH OR WITHOUT PELVIS) 3-4V  BILAT COMPARISON:  No pertinent prior exams available for comparison. FINDINGS: No evidence of hip fracture or dislocation on the provided views. Mild degenerative changes of the bilateral femoroacetabular joints. Large amount of stool within the rectum. IMPRESSION: No evidence of acute osseous or articular abnormality. Large amount of stool within the rectum. Correlate for impaction. Electronically Signed   By: Jackey Loge DO   On: 09/17/2020 13:37   CT Maxillofacial Wo Contrast  Result Date: 09/17/2020 CLINICAL DATA:  Facial trauma. Neck trauma. Additional provided: Unwitnessed fall, bruising to face. EXAM: CT HEAD WITHOUT CONTRAST CT MAXILLOFACIAL WITHOUT CONTRAST CT CERVICAL SPINE WITHOUT CONTRAST TECHNIQUE: Multidetector CT imaging of the head, cervical spine, and maxillofacial structures were performed using the standard protocol without intravenous contrast. Multiplanar CT image reconstructions of the cervical spine and maxillofacial structures were also generated. COMPARISON:  CT head/maxillofacial/cervical spine 08/25/2020. FINDINGS: CT HEAD FINDINGS Brain: Advanced cerebral atrophy. Commensurate prominence of the ventricles and sulci. Comparatively mild cerebellar atrophy. 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 Moderate ill-defined hypoattenuation within the cerebral white matter, which is nonspecific but compatible with chronic small vessel ischemic disease. There is no acute intracranial hemorrhage. No demarcated cortical infarct. No extra-axial fluid collection. No evidence of intracranial mass. No midline shift. Vascular: No hyperdense vessel.  Atherosclerotic calcifications Skull: Normal. Negative for fracture or focal lesion. Other: Small left anterior scalp/forehead hematoma. CT MAXILLOFACIAL FINDINGS Mildly motion degraded exam. Osseous: No evidence of acute maxillofacial fracture. Orbits: No acute finding within the orbits. The globes are  normal in size and contour. The extraocular muscles and optic nerve sheath complexes are symmetric and unremarkable. Sinuses: Complete opacification of the left frontal sinus. Extensive partial opacification of anterior left ethmoid air cells. Mild mucosal thickening within the right ethmoid air cells. Small volume frothy secretions within the left sphenoid sinus. Frothy secretions and severe mucosal thickening within the left maxillary sinus. Mild polypoid mucosal thickening within the right maxillary sinus. Soft tissues: Left anterior scalp, forehead and periorbital soft tissue swelling/hematoma. Other: Poor dentition with multifocal periapical lucencies CT CERVICAL SPINE FINDINGS Alignment: Cervical levocurvature with partially imaged thoracic dextrocurvature. 2 mm C7-T1 grade 1 anterolisthesis, unchanged. Skull base and vertebrae: The basion-dental and atlanto-dental intervals are maintained.No evidence of acute fracture to the cervical spine. Soft tissues and spinal canal: No prevertebral fluid or swelling. No visible canal hematoma. Disc levels: Redemonstrated sequela of prior C5-C7 ACDF. No evidence of acute hardware compromise. Cervical spondylosis. Most notably at C4-C5, there is advanced disc space narrowing with a disc bulge, superimposed left center disc protrusion, endplate spurring, and bilateral disc osteophyte ridge/uncinate hypertrophy. Bilateral bony neural foraminal narrowing and suspected moderate/severe spinal canal stenosis at this level. Upper chest: Redemonstrated right apical pleuroparenchymal scarring. No consolidation within the imaged lung apices. No visible pneumothorax. IMPRESSION: CT head: 1. No evidence of acute intracranial abnormality. 2. Small left  anterior scalp/forehead hematoma. 3. 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 4. Background moderate cerebral white matter chronic small vessel ischemic disease. 5. Advanced cerebral  atrophy with comparatively mild cerebellar atrophy. CT maxillofacial: 1. No evidence of acute maxillofacial fracture. 2. Left anterior scalp, forehead and periorbital soft tissue swelling/hematoma. 3. Fairly extensive paranasal sinus disease, as described. CT cervical spine: 1. No evidence of acute fracture to the cervical spine. 2. Cervical levocurvature with partially imaged thoracic dextrocurvature. 3. Mild C7-T1 grade 1 anterolisthesis, unchanged. 4. Redemonstrated sequela of prior C5-C7 ACDF. No evidence of acute hardware compromise. 5. Cervical spondylosis as described and greatest at C4-C5. Bilateral bony neural foraminal narrowing and suspected moderate/severe spinal canal stenosis at this level. Electronically Signed   By: Jackey Loge DO   On: 09/17/2020 13:33    Procedures Procedures   Medications Ordered in ED Medications  haloperidol lactate (HALDOL) injection 5 mg (5 mg Intramuscular Given 09/17/20 1023)  LORazepam (ATIVAN) injection 1 mg (1 mg Intravenous Given 09/17/20 1224)  haloperidol lactate (HALDOL) injection 2 mg (2 mg Intravenous Given 09/17/20 1223)    ED Course  I have reviewed the triage vital signs and the nursing notes.  Pertinent labs & imaging results that were available during my care of the patient were reviewed by me and considered in my medical decision making (see chart for details).  Clinical Course as of 09/17/20 1525  Thu Sep 17, 2020  2956 [name], radiology tech, states she was able to obtain the rib/chest xray, but was unable to obtain the portable pelvis xray due to patient agitation.  [SJ]  1144 Spoke with spouse, [name], via phone. He states he was actually told the patient fell 2 days ago and it was reported to him that she did not seem to have any injuries. He would like for [place] to do what ever needs to be done in order to obtain the necessary scans. He denies any known recent additional injuries or illnesses. [SJ]  1428 I updated  patient's husband on imaging results.  He states he can come pick the patient up and take her back to the facility. [SJ]    Clinical Course User Index [SJ] Patrick Sohm, Hillard Danker, PA-C   MDM Rules/Calculators/A&P                          Patient presents for evaluation following a fall. Difficulty in assessing the patient due to patient's baseline dementia. I personally reviewed and interpreted her imaging studies.  No acute abnormalities noted.  Findings and plan of care discussed with attending physician, Virgina Norfolk, DO. Dr. Lockie Mola personally evaluated and examined this patient.  Vitals:   09/17/20 1213 09/17/20 1315 09/17/20 1400 09/17/20 1430  BP: 128/84 134/72 134/64 (!) 147/86  Pulse: 60 (!) 58 (!) 54 (!) 57  Resp: Temp:      TempSrc:      SpO2: 98% 100% 100% 100%     Final Clinical Impression(s) / ED Diagnoses Final diagnoses:  Fall, initial encounter  Injury of head, initial encounter    Rx / DC Orders ED Discharge Orders    None       Concepcion Living 09/17/20 1527    Virgina Norfolk, DO 09/17/20 1552

## 2020-09-17 NOTE — Discharge Instructions (Signed)
For the next few hours, the patient may be sleepier and more sedated.  This is due to the medications given in order to facilitate the imaging studies performed here in the ED. Refer to the attached information sheet regarding expected symptoms following head injury as well as symptoms that would require return to the emergency department.

## 2020-09-17 NOTE — ED Triage Notes (Addendum)
Per EMS-from [facility]-unwitnessed fall yesterday-bruising to face-not on blood thinners-history of dementia, no change in mental status per facility-possible left leg injury and right shoulder

## 2020-09-17 NOTE — ED Provider Notes (Signed)
I personally evaluated the patient during the encounter and completed a history, physical, procedures, medical decision making to contribute to the overall care of the patient and decision making for the patient briefly, the patient is a 80 y.o. female is here with fall.  Unwitnessed fall yesterday.  Family did not want patient brought to the ED.  However patient brought today.  Has bruising to the face.  History of dementia, unable to participate in exam.  However moves all extremities.  We will get CT images to rule out any head injury/face injury.  Dispo per images.  Vital signs are normal.  Given Haldol for sedation in order to get images.  See PA note for further results, evaluation, disposition of the patient.   EKG Interpretation None          Virgina Norfolk, DO 09/17/20 1037

## 2021-03-11 ENCOUNTER — Emergency Department (HOSPITAL_COMMUNITY): Payer: Medicare HMO

## 2021-03-11 ENCOUNTER — Encounter (HOSPITAL_COMMUNITY): Payer: Self-pay

## 2021-03-11 ENCOUNTER — Emergency Department (HOSPITAL_COMMUNITY)
Admission: EM | Admit: 2021-03-11 | Discharge: 2021-03-12 | Disposition: A | Discharge status: Home / Self Care | Payer: Medicare HMO | Attending: Emergency Medicine | Admitting: Emergency Medicine

## 2021-03-11 ENCOUNTER — Other Ambulatory Visit: Payer: Self-pay

## 2021-03-11 DIAGNOSIS — Z79899 Other long term (current) drug therapy: Secondary | ICD-10-CM | POA: Diagnosis not present

## 2021-03-11 DIAGNOSIS — I10 Essential (primary) hypertension: Secondary | ICD-10-CM | POA: Insufficient documentation

## 2021-03-11 DIAGNOSIS — E039 Hypothyroidism, unspecified: Secondary | ICD-10-CM | POA: Insufficient documentation

## 2021-03-11 DIAGNOSIS — F0390 Unspecified dementia without behavioral disturbance: Secondary | ICD-10-CM | POA: Diagnosis not present

## 2021-03-11 DIAGNOSIS — W1839XA Other fall on same level, initial encounter: Secondary | ICD-10-CM | POA: Diagnosis not present

## 2021-03-11 DIAGNOSIS — R5383 Other fatigue: Secondary | ICD-10-CM | POA: Diagnosis not present

## 2021-03-11 DIAGNOSIS — S51011A Laceration without foreign body of right elbow, initial encounter: Secondary | ICD-10-CM | POA: Diagnosis not present

## 2021-03-11 DIAGNOSIS — Z20822 Contact with and (suspected) exposure to covid-19: Secondary | ICD-10-CM | POA: Diagnosis not present

## 2021-03-11 DIAGNOSIS — Z87891 Personal history of nicotine dependence: Secondary | ICD-10-CM | POA: Insufficient documentation

## 2021-03-11 DIAGNOSIS — S0003XA Contusion of scalp, initial encounter: Secondary | ICD-10-CM | POA: Diagnosis not present

## 2021-03-11 DIAGNOSIS — W19XXXA Unspecified fall, initial encounter: Secondary | ICD-10-CM

## 2021-03-11 DIAGNOSIS — S59901A Unspecified injury of right elbow, initial encounter: Secondary | ICD-10-CM | POA: Diagnosis present

## 2021-03-11 LAB — CBC WITH DIFFERENTIAL/PLATELET
Abs Immature Granulocytes: 0.02 10*3/uL (ref 0.00–0.07)
Basophils Absolute: 0 10*3/uL (ref 0.0–0.1)
Basophils Relative: 0 %
Eosinophils Absolute: 0 10*3/uL (ref 0.0–0.5)
Eosinophils Relative: 0 %
HCT: 37.9 % (ref 36.0–46.0)
Hemoglobin: 12.2 g/dL (ref 12.0–15.0)
Immature Granulocytes: 0 %
Lymphocytes Relative: 16 %
Lymphs Abs: 1.1 10*3/uL (ref 0.7–4.0)
MCH: 31.7 pg (ref 26.0–34.0)
MCHC: 32.2 g/dL (ref 30.0–36.0)
MCV: 98.4 fL (ref 80.0–100.0)
Monocytes Absolute: 0.8 10*3/uL (ref 0.1–1.0)
Monocytes Relative: 12 %
Neutro Abs: 4.8 10*3/uL (ref 1.7–7.7)
Neutrophils Relative %: 72 %
Platelets: 186 10*3/uL (ref 150–400)
RBC: 3.85 MIL/uL — ABNORMAL LOW (ref 3.87–5.11)
RDW: 12.8 % (ref 11.5–15.5)
WBC: 6.7 10*3/uL (ref 4.0–10.5)
nRBC: 0 % (ref 0.0–0.2)

## 2021-03-11 LAB — COMPREHENSIVE METABOLIC PANEL
ALT: 15 U/L (ref 0–44)
AST: 23 U/L (ref 15–41)
Albumin: 4 g/dL (ref 3.5–5.0)
Alkaline Phosphatase: 80 U/L (ref 38–126)
Anion gap: 8 (ref 5–15)
BUN: 27 mg/dL — ABNORMAL HIGH (ref 8–23)
CO2: 24 mmol/L (ref 22–32)
Calcium: 9.2 mg/dL (ref 8.9–10.3)
Chloride: 111 mmol/L (ref 98–111)
Creatinine, Ser: 0.81 mg/dL (ref 0.44–1.00)
GFR, Estimated: >60 mL/min (ref >60)
Glucose, Bld: 113 mg/dL — ABNORMAL HIGH (ref 70–99)
Potassium: 4.8 mmol/L (ref 3.5–5.1)
Sodium: 143 mmol/L (ref 135–145)
Total Bilirubin: 0.9 mg/dL (ref 0.3–1.2)
Total Protein: 6.9 g/dL (ref 6.5–8.1)

## 2021-03-11 LAB — URINALYSIS, MICROSCOPIC (REFLEX): Squamous Epithelial / HPF: NONE SEEN (ref 0–5)

## 2021-03-11 LAB — URINALYSIS, ROUTINE W REFLEX MICROSCOPIC
Bilirubin Urine: NEGATIVE
Glucose, UA: NEGATIVE mg/dL
Ketones, ur: NEGATIVE mg/dL
Nitrite: POSITIVE — AB
Protein, ur: NEGATIVE mg/dL
Specific Gravity, Urine: 1.02 (ref 1.005–1.030)
pH: 6.5 (ref 5.0–8.0)

## 2021-03-11 LAB — T4, FREE: Free T4: 0.74 ng/dL (ref 0.61–1.12)

## 2021-03-11 LAB — RESP PANEL BY RT-PCR (FLU A&B, COVID) ARPGX2
Influenza A by PCR: NEGATIVE
Influenza B by PCR: NEGATIVE
SARS Coronavirus 2 by RT PCR: NEGATIVE

## 2021-03-11 LAB — TSH: TSH: 4.02 u[IU]/mL (ref 0.350–4.500)

## 2021-03-11 MED ORDER — CEPHALEXIN 500 MG PO CAPS: 500.0000 mg | ORAL_CAPSULE | Freq: Four times a day (QID) | ORAL | 0 refills | Status: AC

## 2021-03-11 NOTE — ED Notes (Signed)
[facility] called  

## 2021-03-11 NOTE — ED Provider Notes (Signed)
[facility] EMERGENCY DEPARTMENT Provider Note   CSN: [id number] Arrival date & time: 03/11/21  1600     History Chief Complaint  Patient presents with   Fatigue    Lethargic per staff, but not lethargic and is baseline per staff    [name] is a 80 y.o. female.  HPI This is a 80 year old female with history of hypertension, hypothyroidism, prior MI, and severe dementia who presents via EMS from a facility for lethargy.  Patient was reportedly found with her face down in her food today and has been generally lethargic x1 day.  No other symptoms provided to EMS.  Facility does report a fall yesterday but unclear if there was any head injury, loss of consciousness, or prolonged time down.    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 [name], MD;  Location: [facility];  Service: Orthopedics;  Laterality: Left;  Left Thumb Suspension Arthroplasty    KYPHOPLASTY N/A 04/07/2016   Procedure: THORACIC 7 KYPHOPLASTY;  Surgeon: Estill Bamberg, MD;  Location: [facility] OR;  Service: Orthopedics;  Laterality: N/A;  THORACIC 7 KYPHOPLASTY   REDUCTION MAMMAPLASTY     TONSILLECTOMY     TUBAL LIGATION       OB History   No obstetric history on  file.     Family History  Problem Relation Age of Onset   Cancer Mother        mesothelioma   Cancer Father        mesothelioma   Dementia Paternal Grandmother     Social History   Tobacco Use   Smoking status: Former    Types: Cigarettes    Quit date: 1980    Years since quitting: 42.8   Smokeless tobacco: Never  Vaping Use   Vaping Use: Never used  Substance Use Topics   Alcohol use: Yes    Comment: Rarely    Drug use: No    Home Medications Prior to Admission medications   Medication Sig Start Date End Date Taking? Authorizing Provider  cephALEXin (KEFLEX) 500 MG capsule Take 1 capsule (500 mg total) by mouth 4 (four) times daily for 7 days. 03/11/21 03/18/21 Yes Doran Clay, MD  acetaminophen (TYLENOL) 500 MG tablet Take 500 mg by mouth 2 (two) times daily.    [provider]  loratadine (CLARITIN) 10 MG tablet Take 10 mg by mouth daily.    [provider]  Melatonin 3 MG TABS Take 3 mg by mouth at bedtime.    [provider]  methimazole (TAPAZOLE) 5 MG tablet Take 5 mg by mouth See admin instructions. 5mg  daily on Monday Wednesday Friday Sunday  [provider]  NON FORMULARY Apply 1 mL topically daily. Lorazepam PLO gel - Apply 1 ml (0.5 mg) topically daily    [provider]  OVER THE COUNTER MEDICATION Take 1 Container by mouth 3 (three) times daily. Supplemental shake    [provider]    Allergies    Asa [aspirin]  Review of Systems   Review of Systems  Unable to perform ROS: Dementia   Physical Exam Updated Vital Signs BP (!) 107/49   Pulse 70   Temp (!) 97.5 F (36.4 C) (Axillary)   Resp 15   Ht 5\' 5"  (1.651 m)   Wt 63.5 kg   SpO2 100%   BMI 23.30 kg/m   Physical Exam Vitals and nursing note reviewed.  Constitutional:      General: She is not in acute distress.    Appearance: She is well-developed.  HENT:     Head: Normocephalic.     Comments: Bruising to the bilateral  temples that is yellowish-blue.  No associated hematoma or palpable skull deformities. Eyes:     Conjunctiva/sclera: Conjunctivae normal.     Pupils: Pupils are equal, round, and reactive to light.  Cardiovascular:     Rate and Rhythm: Normal rate and regular rhythm.     Heart sounds: No murmur heard. Pulmonary:     Effort: Pulmonary effort is normal. No respiratory distress.     Breath sounds: Normal breath sounds.  Abdominal:     Palpations: Abdomen is soft.     Tenderness: There is no abdominal tenderness.  Musculoskeletal:     Cervical back: Neck supple.  Skin:    General: Skin is warm and dry.     Comments: Skin tears on the patient's right elbow hemostatic.  No bruising, joint swelling, and patient has full range of motion in the joint with 2+ distal pulse.  Neurological:     Mental Status: Mental status is at baseline.     Comments: Patient moves all extremities equally.  Opens eyes spontaneously.  Does not follow commands.  At baseline.    ED Results / Procedures / Treatments   Labs (all labs ordered are listed, but only abnormal results are displayed) Labs Reviewed  CBC WITH DIFFERENTIAL/PLATELET - Abnormal; Notable for the following components:      Result Value   RBC 3.85 (*)    All other components within normal limits  COMPREHENSIVE METABOLIC PANEL - Abnormal; Notable for the following components:   Glucose, Bld 113 (*)    BUN 27 (*)    All other components within normal limits  URINALYSIS, ROUTINE W REFLEX MICROSCOPIC - Abnormal; Notable for the following components:   Hgb urine dipstick TRACE (*)    Nitrite POSITIVE (*)    Leukocytes,Ua SMALL (*)    All other components within normal limits  URINALYSIS, MICROSCOPIC (REFLEX) - Abnormal; Notable for the following components:   Bacteria, UA FEW (*)    All other components within normal limits  RESP PANEL BY RT-PCR (FLU A&B, COVID) ARPGX2  TSH  T4, FREE    EKG EKG Interpretation  Date/Time:  Thursday  March 11 2021 16:11:23 EDT Ventricular Rate:  77 PR Interval:    QRS Duration: 118 QT Interval:  381 QTC Calculation: 432 R Axis:   4 Text Interpretation: Atrial fibrillation Nonspecific intraventricular conduction delay Low voltage, precordial leads Artifact in lead(s) II III aVR aVF V1 V2 and baseline wander in lead(s) II III aVF Confirmed by [id number] (Marianna Fuss)  on 03/11/2021 6:44:52 PM  Radiology CT HEAD WO CONTRAST ( )  Result Date: 03/11/2021 CLINICAL DATA:  Unwitnessed fall. EXAM: CT HEAD WITHOUT CONTRAST CT CERVICAL SPINE WITHOUT CONTRAST TECHNIQUE: Multidetector CT imaging of the head and cervical spine was performed following the standard protocol without intravenous contrast. Multiplanar CT image reconstructions of the cervical spine were also generated. COMPARISON:  September 17, 2020. FINDINGS: CT HEAD FINDINGS Brain: Mild diffuse cortical atrophy is noted. Mild chronic ischemic white matter disease is noted. Lateral ventricular dilatation is noted most likely due to surrounding atrophy. No mass lesion, hemorrhage or acute infarction is noted. No midline shift is noted. Vascular: No hyperdense vessel or unexpected calcification. Skull: Normal. Negative for fracture or focal lesion. Sinuses/Orbits: No acute finding. Other: Small left frontal scalp hematoma is noted. CT CERVICAL SPINE FINDINGS Alignment: Minimal grade 1 retrolisthesis of C4-5 is noted secondary to severe degenerative disc disease at this level. Skull base and vertebrae: No acute fracture. No primary bone lesion or focal pathologic process. Soft tissues and spinal canal: No prevertebral fluid or swelling. No visible canal hematoma. Disc levels: Status post surgical anterior fusion of C5-6 and C6-7. Severe degenerative disc disease is also noted at C4-5. Upper chest: Negative. Other: Severe degenerative changes seen involving the left-sided posterior facet joint of C3-4. IMPRESSION: Small left frontal scalp hematoma. No  acute intracranial abnormality seen. Severe multilevel degenerative disc disease. Postsurgical changes are noted as well. No acute abnormality seen in the cervical spine. Electronically Signed   By: Lupita Raider M.D.   On: 03/11/2021 18:17   CT Cervical Spine Wo Contrast  Result Date: 03/11/2021 CLINICAL DATA:  Unwitnessed fall. EXAM: CT HEAD WITHOUT CONTRAST CT CERVICAL SPINE WITHOUT CONTRAST TECHNIQUE: Multidetector CT imaging of the head and cervical spine was performed following the standard protocol without intravenous contrast. Multiplanar CT image reconstructions of the cervical spine were also generated. COMPARISON:  September 17, 2020. FINDINGS: CT HEAD FINDINGS Brain: Mild diffuse cortical atrophy is noted. Mild chronic ischemic white matter disease is noted. Lateral ventricular dilatation is noted most likely due to surrounding atrophy. No mass lesion, hemorrhage or acute infarction is noted. No midline shift is noted. Vascular: No hyperdense vessel or unexpected calcification. Skull: Normal. Negative for fracture or focal lesion. Sinuses/Orbits: No acute finding. Other: Small left frontal scalp hematoma is noted. CT CERVICAL SPINE FINDINGS Alignment: Minimal grade 1 retrolisthesis of C4-5 is noted secondary to severe degenerative disc disease at this level. Skull base and vertebrae: No acute fracture. No primary bone lesion or focal pathologic process. Soft tissues and spinal canal: No prevertebral fluid or swelling. No visible canal hematoma. Disc levels: Status post surgical anterior fusion of C5-6 and C6-7. Severe degenerative disc disease is also noted at C4-5. Upper chest: Negative. Other: Severe degenerative changes seen involving the left-sided posterior facet joint of C3-4. IMPRESSION: Small left frontal scalp hematoma. No acute intracranial abnormality seen. Severe multilevel degenerative disc disease. Postsurgical changes are noted as well. No acute abnormality seen in the cervical spine.  Electronically Signed   By: Lupita Raider M.D.   On: 03/11/2021 18:17    Procedures Procedures   Medications Ordered in ED Medications - No data to display  ED Course  I have reviewed the triage vital signs and the nursing notes.  Pertinent labs & imaging results that were available during my care of the patient were reviewed by me and considered in my medical decision making (see chart for details).    MDM  Rules/Calculators/A&P                          Patient is hemodynamically stable.  She is at her neurologic baseline per facility.  Moves all extremities equally and has no obvious gross neurologic deficits.  Does have bruising to the bilateral temples and a reported recent fall.  Will obtain CT of the head and neck to further evaluate for traumatic injuries as possible cause for patient's lethargy.  EKG with normal sinus rhythm and no evidence of acute ischemia.  Doubt ACS.  Other items on the differential include infection, dehydration, electrolyte derangement, thyroid dysfunction, or UTI.  Will further evaluate with basic labs, TSH, free T4, urine, and COVID testing.    CT head and C-spine negative for any acute traumatic injuries.  Labs unremarkable.  Hemoglobin is 12 and white count is normal.  COVID-negative.  TSH and free T4 both normal.  UA with nitrites and leukocytes concerning for possible UTI. Will treat with keflex at discharge. Attempted to contact facility several times.  Spoke with providers there who are unable to provide any additional history about clinical context patient's presentation.  Vital signs remained stable.  Patient is afebrile without hypotension or tachycardia to indicate any signs of sepsis.  She remains at her neurologic baseline.  She is stable for discharge home.  Return precautions given in discharge paperwork. Awaiting transport.   Final Clinical Impression(s) / ED Diagnoses Final diagnoses:  Fall, initial encounter    Rx / DC Orders ED Discharge  Orders          Ordered    cephALEXin (KEFLEX) 500 MG capsule  4 times daily        03/11/21 2118             Doran Clay, MD 03/11/21 2307    Milagros Loll, MD 03/12/21 1527

## 2021-03-11 NOTE — ED Notes (Signed)
Pt transported to CT ?

## 2021-03-11 NOTE — ED Notes (Signed)
Husband updated.

## 2021-03-11 NOTE — ED Triage Notes (Signed)
Pt arrived to ED via EMS from [facility]. Pt demented at baseline and per staff is at her baseline. EMS reports they were called d/t pt being lethargic and staff reported that they found pt face down in her food. No food noted on pt or in mouth or nose. Pt disoriented x 4. Pt w/ incomprehensible speech and withdraws from pain. Staff told EMS that pt fell yesterday, but was not seen for it. Pt has bilateral temporal bruising w/ purplish yellow coloring.

## 2021-03-11 NOTE — Discharge Instructions (Addendum)
Your test and imaging today was reassuring. It does look like you have a UTI. Please take Keflex four times daily for 7 days for UTI. Return to the ED with new or worsening symptoms including fever or altered mental status. Follow up with your primary care doctor.

## 2021-03-11 NOTE — ED Notes (Signed)
Pt returned from CT °

## 2021-03-11 NOTE — ED Notes (Signed)
Called [facility] to speak w/ staff to get clarification as to why pt was sent to ED. Unable to get in touch w/ appropriate staff to get more details x 3.

## 2021-04-25 ENCOUNTER — Other Ambulatory Visit: Payer: Self-pay

## 2021-04-25 ENCOUNTER — Emergency Department (HOSPITAL_COMMUNITY)
Admission: EM | Admit: 2021-04-25 | Discharge: 2021-04-25 | Disposition: A | Discharge status: Home / Self Care | Payer: Medicare HMO | Attending: Emergency Medicine | Admitting: Emergency Medicine

## 2021-04-25 ENCOUNTER — Emergency Department (HOSPITAL_COMMUNITY): Payer: Medicare HMO

## 2021-04-25 ENCOUNTER — Encounter (HOSPITAL_COMMUNITY): Payer: Self-pay | Admitting: Emergency Medicine

## 2021-04-25 DIAGNOSIS — E039 Hypothyroidism, unspecified: Secondary | ICD-10-CM | POA: Diagnosis not present

## 2021-04-25 DIAGNOSIS — W1839XA Other fall on same level, initial encounter: Secondary | ICD-10-CM | POA: Insufficient documentation

## 2021-04-25 DIAGNOSIS — S0990XA Unspecified injury of head, initial encounter: Secondary | ICD-10-CM

## 2021-04-25 DIAGNOSIS — S0181XA Laceration without foreign body of other part of head, initial encounter: Secondary | ICD-10-CM | POA: Insufficient documentation

## 2021-04-25 DIAGNOSIS — Z87891 Personal history of nicotine dependence: Secondary | ICD-10-CM | POA: Insufficient documentation

## 2021-04-25 DIAGNOSIS — Y92129 Unspecified place in nursing home as the place of occurrence of the external cause: Secondary | ICD-10-CM | POA: Diagnosis not present

## 2021-04-25 DIAGNOSIS — F0390 Unspecified dementia without behavioral disturbance: Secondary | ICD-10-CM | POA: Insufficient documentation

## 2021-04-25 MED ORDER — LACTATED RINGERS IV BOLUS: 500.0000 mL | Freq: Once | INTRAVENOUS | Status: DC

## 2021-04-25 MED ORDER — POTASSIUM CHLORIDE CRYS ER 20 MEQ PO TBCR: 40.0000 meq | EXTENDED_RELEASE_TABLET | Freq: Once | ORAL | Status: DC

## 2021-04-25 MED ORDER — MAGNESIUM SULFATE 2 GM/50ML IV SOLN: 2.0000 g | Freq: Once | INTRAVENOUS | Status: DC

## 2021-04-25 NOTE — ED Provider Notes (Signed)
[facility]-EMERGENCY DEPT Provider Note   CSN: [id number] Arrival date & time: 04/25/21  1922     History Chief Complaint  Patient presents with   [name] is a 80 y.o. female.  HPI    80 year old female comes in with chief complaint of follow-up  Level 5 caveat for severe dementia  Patient's husband is at the bedside.  He reports that patient had a mechanical fall while at the nursing home.  The fall was witnessed.  EMS confirms the story.  Patient has a laceration to her face, and was brought here.  She has no complaints from her side.  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 [name], MD;  Location: [facility];  Service: Orthopedics;  Laterality: Left;  Left Thumb Suspension Arthroplasty    KYPHOPLASTY N/A 04/07/2016   Procedure: THORACIC 7 KYPHOPLASTY;  Surgeon: Estill Bamberg, MD;  Location: [facility] OR;  Service: Orthopedics;  Laterality: N/A;  THORACIC 7 KYPHOPLASTY   REDUCTION MAMMAPLASTY     TONSILLECTOMY     TUBAL LIGATION       OB History   No obstetric history on file.     Family History  Problem Relation Age of Onset   Cancer Mother        mesothelioma   Cancer Father        mesothelioma    Dementia Paternal Grandmother     Social History   Tobacco Use   Smoking status: Former    Types: Cigarettes    Quit date: 1980    Years since quitting: 42.9   Smokeless tobacco: Never  Vaping Use   Vaping Use: Never used  Substance Use Topics   Alcohol use: Yes    Comment: Rarely    Drug use: No    Home Medications Prior to Admission medications   Medication Sig Start Date End Date Taking? Authorizing Provider  acetaminophen (TYLENOL) 500 MG tablet Take 500 mg by mouth 2 (two) times daily.    [provider]  loratadine (CLARITIN) 10 MG tablet Take 10 mg by mouth daily.    [provider]  Melatonin 3 MG TABS Take 3 mg by mouth at bedtime.    [provider]  methimazole (TAPAZOLE) 5 MG tablet Take 5 mg by mouth See admin instructions. 5mg  daily on Monday Wednesday Friday Sunday    [provider]  NON FORMULARY Apply 1 mL topically daily. Lorazepam PLO gel - Apply 1 ml (0.5 mg) topically daily    [provider]  OVER THE COUNTER MEDICATION Take 1 Container by mouth 3 (three) times daily. Supplemental shake  [provider]    Allergies    Asa [aspirin]  Review of Systems   Review of Systems  Unable to perform ROS: Dementia   Physical Exam Updated Vital Signs BP 130/83 (BP Location: Right Arm)   Pulse 72   Temp 99.2 F (37.3 C) (Oral)   Resp 18   Ht 5\' 5"  (1.651 m)   Wt 63.5 kg   SpO2 100%   BMI 23.30 kg/m   Physical Exam Vitals and nursing note reviewed.  Constitutional:      Appearance: She is well-developed.  HENT:     Head:     Comments: Right facial laceration Neck:     Comments: In a c-collar Cardiovascular:     Rate and Rhythm: Normal rate.  Pulmonary:     Effort: Pulmonary effort is normal.  Abdominal:     Tenderness: There is no abdominal tenderness.  Musculoskeletal:        General: No tenderness or deformity.  Skin:    General: Skin is warm and dry.  Neurological:     Mental  Status: She is alert and oriented to person, place, and time.    ED Results / Procedures / Treatments   Labs (all labs ordered are listed, but only abnormal results are displayed) Labs Reviewed - No data to display  EKG None  Radiology CT Head Wo Contrast  Result Date: 04/25/2021 CLINICAL DATA:  Head trauma, minor (Age >= 65y) EXAM: CT HEAD WITHOUT CONTRAST TECHNIQUE: Contiguous axial images were obtained from the base of the skull through the vertex without intravenous contrast. COMPARISON:  03/11/2021 FINDINGS: Brain: There is no acute intracranial hemorrhage, mass effect, or edema. Stable prominence of the ventricles and sulci reflecting advanced parenchymal volume loss. There is disproportionate temporal volume loss. Patchy and confluent areas of hypoattenuation in the supratentorial white matter probably reflects stable chronic microvascular ischemic changes. 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 No extra-axial collection. Vascular: There is atherosclerotic calcification at the skull base. Skull: Calvarium is unremarkable. Sinuses/Orbits: No acute finding. Other: None. IMPRESSION: No evidence of acute intracranial injury. Stable chronic findings detailed above. Electronically Signed   By: 03/13/2021 M.D.   On: 04/25/2021 20:45   CT Cervical Spine Wo Contrast  Result Date: 04/25/2021 CLINICAL DATA:  Polytrauma, critical, head/C-spine injury suspected EXAM: CT CERVICAL SPINE WITHOUT CONTRAST TECHNIQUE: Multidetector CT imaging of the cervical spine was performed without intravenous contrast. Multiplanar CT image reconstructions were also generated. COMPARISON:  03/11/2021 FINDINGS: Alignment: Stable. Skull base and vertebrae: Postoperative changes of anterior fusion at C5-C7. There is no bridging bone at C5-C6. Some bridging bone is present at C6-C7. Stable vertebral body heights. Prominent anterior osteophyte at C4. No acute fracture. Soft  tissues and spinal canal: No prevertebral fluid or swelling. No visible canal hematoma. Disc levels:  Stable degenerative changes of the short interval. Upper chest: No apical lung mass. Other: No new abnormality. IMPRESSION: No acute cervical spine fracture. Electronically Signed   By: 03/13/2021 M.D.   On: 04/25/2021 20:48    Procedures .14/04/2022Laceration Repair  Date/Time: 04/25/2021 10:25 PM Performed by: 14/08/2020, MD Authorized by: Derwood Kaplan, MD   Consent:    Consent obtained:  Verbal   Consent given by:  Spouse   Risks, benefits, and alternatives were discussed: yes     Risks discussed:  Poor cosmetic result, poor wound healing and need for additional repair   Alternatives discussed:  No treatment  Universal protocol:    Procedure explained and questions answered to patient or proxy's satisfaction: yes     Patient identity confirmed:  Arm band Anesthesia:    Anesthesia method:  None Laceration details:    Location:  Face   Face location:  Forehead   Length (cm):  5   Depth (mm):  5 Exploration:    Limited defect created (wound extended): no     Hemostasis achieved with:  Direct pressure   Imaging outcome: foreign body not noted     Wound extent: fascia violated     Contaminated: yes   Treatment:    Area cleansed with:  Soap and water   Amount of cleaning:  Standard   Irrigation solution:  Tap water   Irrigation volume:  10   Debridement:  None   Undermining:  None   Scar revision: no   Skin repair:    Repair method:  Steri-Strips and tissue adhesive   Number of Steri-Strips:  5 Repair type:    Repair type:  Simple Post-procedure details:    Dressing:  Adhesive bandage   Procedure completion:  Tolerated well, no immediate complications   Medications Ordered in ED Medications - No data to display  ED Course  I have reviewed the triage vital signs and the nursing notes.  Pertinent labs & imaging results that were available during my care of the  patient were reviewed by me and considered in my medical decision making (see chart for details).    MDM Rules/Calculators/A&P                           80 year old female comes in with chief complaint of fall.  She has a laceration to the forehead -that was repaired.  At the request of patient's husband, Dermabond and Steri-Strips were utilized for the repair.  CT scan of the brain and C-spine were negative.  Patient does not have any gross deformity and is noted to be moving all 4 extremities.  She stable for discharge.  Final Clinical Impression(s) / ED Diagnoses Final diagnoses:  Facial laceration, initial encounter  Injury of head, initial encounter    Rx / DC Orders ED Discharge Orders     None        Derwood Kaplan, MD 04/25/21 2228

## 2021-04-25 NOTE — ED Triage Notes (Signed)
Pt arrived via EMS from [facility]. Pt had a witnessed fall and has 2 reported lacs to her right forehead. Pt has hx of dementia. Pt is A&Ox1 which is her baseline. Pt is not on blood thinners.

## 2021-04-25 NOTE — Discharge Instructions (Signed)
You are seen in the ER after you had a fall and resultant laceration to your face.  The laceration was repaired with Steri-Strips and Dermabond. CT scan of the brain and cervical spine are normal.

## 2021-04-25 NOTE — ED Notes (Signed)
Attempted to reposition C-Collar several times

## 2021-05-09 ENCOUNTER — Other Ambulatory Visit: Payer: Self-pay

## 2021-05-09 ENCOUNTER — Encounter (HOSPITAL_BASED_OUTPATIENT_CLINIC_OR_DEPARTMENT_OTHER): Payer: Self-pay

## 2021-05-09 ENCOUNTER — Emergency Department (HOSPITAL_BASED_OUTPATIENT_CLINIC_OR_DEPARTMENT_OTHER)
Admission: EM | Admit: 2021-05-09 | Discharge: 2021-05-09 | Disposition: A | Discharge status: Home / Self Care | Payer: Medicare HMO | Attending: Emergency Medicine | Admitting: Emergency Medicine

## 2021-05-09 ENCOUNTER — Emergency Department (HOSPITAL_BASED_OUTPATIENT_CLINIC_OR_DEPARTMENT_OTHER): Payer: Medicare HMO

## 2021-05-09 DIAGNOSIS — Y92129 Unspecified place in nursing home as the place of occurrence of the external cause: Secondary | ICD-10-CM | POA: Diagnosis not present

## 2021-05-09 DIAGNOSIS — E039 Hypothyroidism, unspecified: Secondary | ICD-10-CM | POA: Diagnosis not present

## 2021-05-09 DIAGNOSIS — G309 Alzheimer's disease, unspecified: Secondary | ICD-10-CM | POA: Insufficient documentation

## 2021-05-09 DIAGNOSIS — Z79899 Other long term (current) drug therapy: Secondary | ICD-10-CM | POA: Insufficient documentation

## 2021-05-09 DIAGNOSIS — S0181XA Laceration without foreign body of other part of head, initial encounter: Secondary | ICD-10-CM

## 2021-05-09 DIAGNOSIS — W01198A Fall on same level from slipping, tripping and stumbling with subsequent striking against other object, initial encounter: Secondary | ICD-10-CM | POA: Diagnosis not present

## 2021-05-09 DIAGNOSIS — Z96692 Finger-joint replacement of left hand: Secondary | ICD-10-CM | POA: Insufficient documentation

## 2021-05-09 DIAGNOSIS — Z87891 Personal history of nicotine dependence: Secondary | ICD-10-CM | POA: Insufficient documentation

## 2021-05-09 DIAGNOSIS — F02818 Dementia in other diseases classified elsewhere, unspecified severity, with other behavioral disturbance: Secondary | ICD-10-CM | POA: Insufficient documentation

## 2021-05-09 MED ORDER — LIDOCAINE HCL 2 % IJ SOLN
10.0000 mL | Freq: Once | INTRAMUSCULAR | Status: AC
  Administered 2021-05-09: 21:00:00 200 mg
  Filled 2021-05-09: qty 20

## 2021-05-09 NOTE — ED Provider Notes (Signed)
[facility] EMERGENCY DEPT Provider Note   CSN: [id number] Arrival date & time: 05/09/21  2017     History Chief Complaint  Patient presents with   Head Injury    [name] is a 80 y.o. female with a past medical history of Alzheimer dementia presenting today with her husband after a fall.  She lives at a nursing facility and apparently fell down.  Her husband was contacted and brought her to the emergency department.  He states that she is at her baseline.  Not on any blood thinners.  2 weeks ago she was seen after hitting her head and sustaining a laceration to her right eyebrow.  This was glued together however now is a gaping laceration.  EMS was at the nursing facility for another patient and stated that she needed to go to the emergency room for stitches.    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 [name], MD;  Location: [facility];  Service: Orthopedics;  Laterality: Left;  Left Thumb Suspension Arthroplasty    KYPHOPLASTY N/A 04/07/2016   Procedure: THORACIC 7 KYPHOPLASTY;  Surgeon: Estill Bamberg, MD;  Location: [facility] OR;  Service: Orthopedics;  Laterality: N/A;  THORACIC 7 KYPHOPLASTY   REDUCTION  MAMMAPLASTY     TONSILLECTOMY     TUBAL LIGATION       OB History   No obstetric history on file.     Family History  Problem Relation Age of Onset   Cancer Mother        mesothelioma   Cancer Father        mesothelioma   Dementia Paternal Grandmother     Social History   Tobacco Use   Smoking status: Former    Types: Cigarettes    Quit date: 1980    Years since quitting: 42.9   Smokeless tobacco: Never  Vaping Use   Vaping Use: Never used  Substance Use Topics   Alcohol use: Not Currently    Comment: Rarely    Drug use: No    Home Medications Prior to Admission medications   Medication Sig Start Date End Date Taking? Authorizing Provider  acetaminophen (TYLENOL) 500 MG tablet Take 500 mg by mouth 2 (two) times daily.    [provider]  loratadine (CLARITIN) 10 MG tablet Take 10 mg by mouth daily.    [provider]  Melatonin 3 MG TABS Take 3 mg by mouth at bedtime.    [provider]  methimazole (TAPAZOLE) 5 MG tablet Take 5 mg by mouth See admin instructions. 5mg  daily on Monday Wednesday Friday Sunday    [provider]  NON FORMULARY Apply 1 mL topically daily. Lorazepam PLO gel - Apply 1 ml (0.5 mg) topically daily    [provider]  OVER THE COUNTER MEDICATION Take 1 Container by mouth 3 (three) times daily. Supplemental shake    [provider]    Allergies    Asa [aspirin]  Review of Systems   Review of Systems  Reason unable to perform ROS: Level 5 caveat due to dementia and nonverbal state.   Physical Exam Updated Vital Signs BP 107/71 (BP Location: Left Arm)    Pulse 80    Temp 99.5 F (37.5 C) (Temporal)    Resp 20    Ht 5\' 1"  (1.549 m)    Wt 47.7 kg    BMI 19.86 kg/m   Physical Exam Vitals and nursing note reviewed.  Constitutional:      Appearance: Normal appearance.  HENT:     Head: Normocephalic.     Comments: 2 cm laceration to the right eyebrow. Eyes:     General: No scleral  icterus.    Extraocular Movements: Extraocular movements intact.     Conjunctiva/sclera: Conjunctivae normal.     Pupils: Pupils are equal, round, and reactive to light.  Cardiovascular:     Rate and Rhythm: Normal rate and regular rhythm.  Pulmonary:     Effort: Pulmonary effort is normal. No respiratory distress.  Skin:    Findings: No rash.  Neurological:     Mental Status: She is alert.  Psychiatric:        Mood and Affect: Mood normal.    ED Results / Procedures / Treatments   Labs (all labs ordered are listed, but only abnormal results are displayed) Labs Reviewed - No data to display  EKG None  Radiology No results found.  Procedures . Laceration Repair  Date/Time: 05/09/2021 9:08 PM Performed by: 05/11/2021, PA-C Authorized by: Saddie Benders, PA-C   Consent:    Consent given by:  Spouse   Risks discussed:  Infection, pain and poor cosmetic result Universal protocol:    Procedure explained and questions answered to patient or proxy's satisfaction: yes     Imaging studies available: yes     Patient identity confirmed:  Arm band Anesthesia:    Anesthesia method:  Local infiltration   Local anesthetic:  Lidocaine 2% w/o epi Laceration details:    Length (cm):  2   Depth (mm):  3.5 Exploration:    Hemostasis achieved with:  Direct pressure   Imaging outcome: foreign body not noted     Contaminated: no   Treatment:    Area cleansed with:  Saline   Amount of cleaning:  Standard   Irrigation solution:  Sterile saline   Irrigation volume:  50cc   Irrigation method:  Syringe Skin repair:    Repair method:  Sutures   Suture size:  5-0   Suture material:  Prolene   Suture technique:  Simple interrupted   Number of sutures:  2 Approximation:    Approximation:  Close Repair type:    Repair type:  Simple Post-procedure details:    Procedure completion:  Tolerated Comments:     Patient tolerated the procedure.  Assistance from to RN.  It was  difficult to keep the patient fully still, only 2 sutures placed.     Medications Ordered in ED Medications  lidocaine (XYLOCAINE) 2 % (with pres) injection 200 mg (200 mg Infiltration Given 05/09/21 2103)    ED Course  I have reviewed the triage vital signs and the nursing notes.  Pertinent labs & imaging results that were available during my care of the patient were reviewed by me and considered in my medical decision making (see chart for details).    MDM Rules/Calculators/A&P 80 year old female presenting after a fall.  She is unable to supply history due to dementia.  Because of this I obtained a head CT which was negative.  2 Prolene sutures placed with assistance from Rns to hold her down.  Consent was given by patient's husband.  Ideally 3-4 would have been placed however with proper care I believe the area will heal well.Husband understands that the stitches must be removed between 7 and 10 days.  Final Clinical Impression(s) / ED Diagnoses Final diagnoses:  Facial laceration, initial encounter    Rx / DC Orders Results and diagnoses were explained to the patient. Return precautions discussed in full. Patient had no additional questions and expressed complete understanding.     Woodroe Chen 05/14/21 1511    Pollyann Savoy, MD 05/18/21 [phone]

## 2021-05-09 NOTE — ED Triage Notes (Signed)
Pt BIB her husband via POV coming from [facility]. Her husband states that he was told pt "banged her head." Husband unable to provide any other details. Pt with lac above R eye. Pt with hx of advanced Alzheimer's. Per husband pt is acting appropriate at her baseline.

## 2021-05-09 NOTE — Discharge Instructions (Addendum)
2 stitches have been placed above [name]'s eye.  Have the stitches removed in 7 to 10 days.  This may be done by her primary care provider, urgent care or emergency department.

## 2021-05-09 NOTE — ED Notes (Signed)
Pt's husband verbalizes understanding of discharge instructions. Opportunity for questioning and answers were provided. Pt discharged from ED to facility.

## 2021-06-07 ENCOUNTER — Other Ambulatory Visit: Payer: Self-pay

## 2021-06-07 ENCOUNTER — Emergency Department (HOSPITAL_COMMUNITY): Payer: Medicare Other

## 2021-06-07 ENCOUNTER — Encounter (HOSPITAL_COMMUNITY): Payer: Self-pay

## 2021-06-07 ENCOUNTER — Inpatient Hospital Stay (HOSPITAL_COMMUNITY)
Admission: EM | Admit: 2021-06-07 | Discharge: 2021-06-10 | DRG: 682 | Disposition: A | Discharge status: Skilled Nursing Facility | Payer: Medicare Other | Source: Skilled Nursing Facility | Attending: Internal Medicine | Admitting: Internal Medicine

## 2021-06-07 DIAGNOSIS — I252 Old myocardial infarction: Secondary | ICD-10-CM

## 2021-06-07 DIAGNOSIS — I2510 Atherosclerotic heart disease of native coronary artery without angina pectoris: Secondary | ICD-10-CM | POA: Diagnosis present

## 2021-06-07 DIAGNOSIS — E538 Deficiency of other specified B group vitamins: Secondary | ICD-10-CM | POA: Diagnosis present

## 2021-06-07 DIAGNOSIS — E870 Hyperosmolality and hypernatremia: Secondary | ICD-10-CM

## 2021-06-07 DIAGNOSIS — Z808 Family history of malignant neoplasm of other organs or systems: Secondary | ICD-10-CM

## 2021-06-07 DIAGNOSIS — R809 Proteinuria, unspecified: Secondary | ICD-10-CM | POA: Diagnosis present

## 2021-06-07 DIAGNOSIS — F02C18 Dementia in other diseases classified elsewhere, severe, with other behavioral disturbance: Secondary | ICD-10-CM | POA: Diagnosis present

## 2021-06-07 DIAGNOSIS — E860 Dehydration: Secondary | ICD-10-CM | POA: Diagnosis present

## 2021-06-07 DIAGNOSIS — R296 Repeated falls: Secondary | ICD-10-CM | POA: Diagnosis present

## 2021-06-07 DIAGNOSIS — F0280 Dementia in other diseases classified elsewhere without behavioral disturbance: Secondary | ICD-10-CM | POA: Diagnosis present

## 2021-06-07 DIAGNOSIS — G309 Alzheimer's disease, unspecified: Secondary | ICD-10-CM | POA: Diagnosis present

## 2021-06-07 DIAGNOSIS — B964 Proteus (mirabilis) (morganii) as the cause of diseases classified elsewhere: Secondary | ICD-10-CM | POA: Diagnosis present

## 2021-06-07 DIAGNOSIS — K5900 Constipation, unspecified: Secondary | ICD-10-CM

## 2021-06-07 DIAGNOSIS — N179 Acute kidney failure, unspecified: Secondary | ICD-10-CM | POA: Diagnosis not present

## 2021-06-07 DIAGNOSIS — R823 Hemoglobinuria: Secondary | ICD-10-CM | POA: Diagnosis present

## 2021-06-07 DIAGNOSIS — R5381 Other malaise: Secondary | ICD-10-CM | POA: Diagnosis present

## 2021-06-07 DIAGNOSIS — G4710 Hypersomnia, unspecified: Secondary | ICD-10-CM | POA: Diagnosis present

## 2021-06-07 DIAGNOSIS — Z20822 Contact with and (suspected) exposure to covid-19: Secondary | ICD-10-CM | POA: Diagnosis present

## 2021-06-07 DIAGNOSIS — E861 Hypovolemia: Secondary | ICD-10-CM | POA: Diagnosis present

## 2021-06-07 DIAGNOSIS — Z87891 Personal history of nicotine dependence: Secondary | ICD-10-CM

## 2021-06-07 DIAGNOSIS — G9341 Metabolic encephalopathy: Secondary | ICD-10-CM | POA: Diagnosis present

## 2021-06-07 DIAGNOSIS — E872 Acidosis, unspecified: Secondary | ICD-10-CM | POA: Diagnosis present

## 2021-06-07 DIAGNOSIS — E0590 Thyrotoxicosis, unspecified without thyrotoxic crisis or storm: Secondary | ICD-10-CM | POA: Diagnosis present

## 2021-06-07 DIAGNOSIS — Z886 Allergy status to analgesic agent status: Secondary | ICD-10-CM

## 2021-06-07 DIAGNOSIS — M1990 Unspecified osteoarthritis, unspecified site: Secondary | ICD-10-CM | POA: Diagnosis present

## 2021-06-07 DIAGNOSIS — Z79899 Other long term (current) drug therapy: Secondary | ICD-10-CM

## 2021-06-07 DIAGNOSIS — Z9851 Tubal ligation status: Secondary | ICD-10-CM

## 2021-06-07 DIAGNOSIS — M810 Age-related osteoporosis without current pathological fracture: Secondary | ICD-10-CM | POA: Diagnosis present

## 2021-06-07 DIAGNOSIS — N390 Urinary tract infection, site not specified: Secondary | ICD-10-CM | POA: Diagnosis present

## 2021-06-07 DIAGNOSIS — K5641 Fecal impaction: Secondary | ICD-10-CM | POA: Diagnosis present

## 2021-06-07 DIAGNOSIS — F32A Depression, unspecified: Secondary | ICD-10-CM | POA: Diagnosis present

## 2021-06-07 LAB — URINALYSIS, ROUTINE W REFLEX MICROSCOPIC
Bilirubin Urine: NEGATIVE
Glucose, UA: NEGATIVE mg/dL
Ketones, ur: NEGATIVE mg/dL
Nitrite: POSITIVE — AB
Protein, ur: 100 mg/dL — AB
Specific Gravity, Urine: 1.018 (ref 1.005–1.030)
WBC, UA: >50 WBC/hpf — ABNORMAL HIGH (ref 0–5)
pH: 7 (ref 5.0–8.0)

## 2021-06-07 LAB — RESP PANEL BY RT-PCR (FLU A&B, COVID) ARPGX2
Influenza A by PCR: NEGATIVE
Influenza B by PCR: NEGATIVE
SARS Coronavirus 2 by RT PCR: NEGATIVE

## 2021-06-07 LAB — COMPREHENSIVE METABOLIC PANEL
ALT: 54 U/L — ABNORMAL HIGH (ref 0–44)
AST: 30 U/L (ref 15–41)
Albumin: 4.5 g/dL (ref 3.5–5.0)
Alkaline Phosphatase: 116 U/L (ref 38–126)
Anion gap: 7 (ref 5–15)
BUN: 76 mg/dL — ABNORMAL HIGH (ref 8–23)
CO2: 26 mmol/L (ref 22–32)
Calcium: 9.3 mg/dL (ref 8.9–10.3)
Chloride: 130 mmol/L — ABNORMAL HIGH (ref 98–111)
Creatinine, Ser: 1.51 mg/dL — ABNORMAL HIGH (ref 0.44–1.00)
GFR, Estimated: 35 mL/min — ABNORMAL LOW (ref >60)
Glucose, Bld: 123 mg/dL — ABNORMAL HIGH (ref 70–99)
Potassium: 4.2 mmol/L (ref 3.5–5.1)
Sodium: 163 mmol/L (ref 135–145)
Total Bilirubin: 0.9 mg/dL (ref 0.3–1.2)
Total Protein: 7.7 g/dL (ref 6.5–8.1)

## 2021-06-07 LAB — CBC WITH DIFFERENTIAL/PLATELET
Abs Immature Granulocytes: 0.06 10*3/uL (ref 0.00–0.07)
Basophils Absolute: 0 10*3/uL (ref 0.0–0.1)
Basophils Relative: 0 %
Eosinophils Absolute: 0.1 10*3/uL (ref 0.0–0.5)
Eosinophils Relative: 1 %
HCT: 47.6 % — ABNORMAL HIGH (ref 36.0–46.0)
Hemoglobin: 14.7 g/dL (ref 12.0–15.0)
Immature Granulocytes: 1 %
Lymphocytes Relative: 20 %
Lymphs Abs: 2.3 10*3/uL (ref 0.7–4.0)
MCH: 30.9 pg (ref 26.0–34.0)
MCHC: 30.9 g/dL (ref 30.0–36.0)
MCV: 100 fL (ref 80.0–100.0)
Monocytes Absolute: 0.6 10*3/uL (ref 0.1–1.0)
Monocytes Relative: 5 %
Neutro Abs: 8.7 10*3/uL — ABNORMAL HIGH (ref 1.7–7.7)
Neutrophils Relative %: 73 %
Platelets: 272 10*3/uL (ref 150–400)
RBC: 4.76 MIL/uL (ref 3.87–5.11)
RDW: 12.7 % (ref 11.5–15.5)
WBC: 11.7 10*3/uL — ABNORMAL HIGH (ref 4.0–10.5)
nRBC: 0 % (ref 0.0–0.2)

## 2021-06-07 LAB — SODIUM
Sodium: 159 mmol/L — ABNORMAL HIGH (ref 135–145)
Sodium: 155 mmol/L — ABNORMAL HIGH (ref 135–145)
Sodium: 155 mmol/L — ABNORMAL HIGH (ref 135–145)

## 2021-06-07 MED ORDER — ENOXAPARIN SODIUM 30 MG/0.3ML IJ SOSY
30.0000 mg | PREFILLED_SYRINGE | INTRAMUSCULAR | Status: DC
  Administered 2021-06-07: 30 mg via SUBCUTANEOUS
  Filled 2021-06-07: qty 0.3

## 2021-06-07 MED ORDER — ACETAMINOPHEN 325 MG PO TABS
650.0000 mg | ORAL_TABLET | Freq: Four times a day (QID) | ORAL | Status: DC | PRN
  Administered 2021-06-10: 650 mg via ORAL
  Filled 2021-06-07: qty 2

## 2021-06-07 MED ORDER — ACETAMINOPHEN 650 MG RE SUPP
650.0000 mg | Freq: Four times a day (QID) | RECTAL | Status: DC | PRN
  Administered 2021-06-08: 650 mg via RECTAL
  Filled 2021-06-07: qty 1

## 2021-06-07 MED ORDER — ONDANSETRON HCL 4 MG PO TABS: 4.0000 mg | ORAL_TABLET | Freq: Four times a day (QID) | ORAL | Status: DC | PRN

## 2021-06-07 MED ORDER — DEXTROSE 5 % IV SOLN: INTRAVENOUS | Status: DC

## 2021-06-07 MED ORDER — SODIUM CHLORIDE 0.9 % IV SOLN
1.0000 g | INTRAVENOUS | Status: AC
  Administered 2021-06-07 – 2021-06-09 (×3): 1 g via INTRAVENOUS
  Filled 2021-06-07 (×3): qty 10

## 2021-06-07 MED ORDER — ONDANSETRON HCL 4 MG/2ML IJ SOLN: 4.0000 mg | Freq: Four times a day (QID) | INTRAMUSCULAR | Status: DC | PRN

## 2021-06-07 NOTE — ED Triage Notes (Signed)
BIB EMS from [facility]. Facility thinks she had a fall sometime last night and wasn't "acting her norm" per facility. She was holding on to her right hip but pt showed no signs of pain and was content.  Screaming is her baseline per nursing facility.  160 systolic palpated 84 hr

## 2021-06-07 NOTE — Plan of Care (Signed)
  Problem: Education: Goal: Knowledge of General Education information will improve Description Including pain rating scale, medication(s)/side effects and non-pharmacologic comfort measures Outcome: Progressing   

## 2021-06-07 NOTE — ED Provider Notes (Signed)
[facility]-EMERGENCY DEPT Provider Note  CSN: [id number] Arrival date & time: 06/07/21 5993  Chief Complaint(s) Fall  HPI [name] is a 81 y.o. female with PMH severe Alzheimer's dementia nonverbal at baseline, hypothyroidism, previous MI, multiple ER presentations for frequent falls who presents emergency department for evaluation of altered mental status.  She arrives from the facility stating that she "just was not acting right".  Unsure if patient fell.  Patient unable to provide history as she is nonverbal.  Additional history eventually obtained from patient's husband who states that she is acting differently than normal.  She arrives grabbing her right hip and appears generally uncomfortable.   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 Authorizing Provider  acetaminophen (TYLENOL) 500 MG tablet Take 500 mg by mouth 2 (two) times daily.    [provider]  loratadine (CLARITIN) 10 MG tablet Take 10 mg by mouth daily.    [provider]  Melatonin 3 MG TABS Take 3 mg by mouth at bedtime.    [provider]  methimazole (TAPAZOLE) 5 MG tablet Take 5 mg by mouth See admin instructions. 5mg  daily on Monday Wednesday Friday Sunday     [provider]  NON FORMULARY Apply 1 mL topically daily. Lorazepam PLO gel - Apply 1 ml (0.5 mg) topically daily    [provider]  OVER THE COUNTER MEDICATION Take 1 Container by mouth 3 (three) times daily. Supplemental shake    [provider]                                                                                                                                    Past Surgical History Past Surgical History:  Procedure Laterality Date   BREAST SURGERY     reduction   bunionectomies     FINGER ARTHROPLASTY  04/02/2012   Procedure: FINGER ARTHROPLASTY;  Surgeon: 13/03/2012. Cliffton Asters, MD;  Location: [facility];  Service: Orthopedics;  Laterality: Left;  Left Thumb Suspension Arthroplasty    KYPHOPLASTY N/A 04/07/2016   Procedure: THORACIC 7 KYPHOPLASTY;  Surgeon: 04/09/2016, MD;  Location: [facility]  OR;  Service: Orthopedics;  Laterality: N/A;  THORACIC 7 KYPHOPLASTY   REDUCTION MAMMAPLASTY     TONSILLECTOMY     TUBAL LIGATION     Family History Family History  Problem Relation Age of Onset   Cancer Mother        mesothelioma   Cancer Father        mesothelioma   Dementia Paternal Grandmother     Social History Social History   Tobacco Use   Smoking status: Former    Types: Cigarettes    Quit date: 1980    Years since quitting: 43.0   Smokeless tobacco: Never  Vaping Use   Vaping Use: Never used  Substance Use Topics   Alcohol use: Not Currently    Comment: Rarely    Drug use: No   Allergies Asa [aspirin]  Review of Systems Review of Systems  Psychiatric/Behavioral:  Positive for confusion.    Physical Exam Vital Signs  I have reviewed the triage vital signs BP 138/76    Pulse 70    Temp 97.7 F (36.5 C) (Oral)    Resp 19    Ht 5\' 1"  (1.549 m)    Wt 47.6 kg    SpO2 100%    BMI 19.84 kg/m   Physical Exam Vitals and nursing note reviewed.  Constitutional:      General: She is not in acute distress.     Appearance: She is well-developed. She is ill-appearing.  HENT:     Head: Normocephalic and atraumatic.  Eyes:     Conjunctiva/sclera: Conjunctivae normal.  Cardiovascular:     Rate and Rhythm: Normal rate and regular rhythm.     Heart sounds: No murmur heard. Pulmonary:     Effort: Pulmonary effort is normal. No respiratory distress.     Breath sounds: Normal breath sounds.  Abdominal:     Palpations: Abdomen is soft.     Tenderness: There is no abdominal tenderness.  Musculoskeletal:        General: No swelling.     Cervical back: Neck supple.  Skin:    General: Skin is warm and dry.     Capillary Refill: Capillary refill takes less than 2 seconds.  Neurological:     Mental Status: Mental status is at baseline.    ED Results and Treatments Labs (all labs ordered are listed, but only abnormal results are displayed) Labs Reviewed  CBC WITH DIFFERENTIAL/PLATELET - Abnormal; Notable for the following components:      Result Value   WBC 11.7 (*)    HCT 47.6 (*)    Neutro Abs 8.7 (*)    All other components within normal limits  COMPREHENSIVE METABOLIC PANEL - Abnormal; Notable for the following components:   Sodium 163 (*)    Chloride 130 (*)    Glucose, Bld 123 (*)    BUN 76 (*)    Creatinine, Ser 1.51 (*)    ALT 54 (*)    GFR, Estimated 35 (*)    All other components within normal limits  URINALYSIS, ROUTINE W REFLEX MICROSCOPIC  SODIUM  SODIUM  SODIUM  Radiology CT ABDOMEN PELVIS WO CONTRAST  Result Date: 06/07/2021 CLINICAL DATA:  Bowel obstruction suspected EXAM: CT ABDOMEN AND PELVIS WITHOUT CONTRAST TECHNIQUE: Multidetector CT imaging of the abdomen and pelvis was performed following the standard protocol without IV contrast. RADIATION DOSE REDUCTION: This exam was performed according to the departmental dose-optimization program which  includes automated exposure control, adjustment of the mA and/or kV according to patient size and/or use of iterative reconstruction technique. COMPARISON:  None. FINDINGS: Lower chest: Lung bases are clear. Hepatobiliary: No focal hepatic lesion. No biliary duct dilatation. Common bile duct is normal. Pancreas: Pancreas is normal. No ductal dilatation. No pancreatic inflammation. Spleen: Normal spleen Adrenals/urinary tract: Adrenal glands and kidneys are normal. The ureters and bladder normal. Stomach/Bowel: Stomach small bowel normal. No small bowel dilatation. Appendix is not identified. There is motion degradation of the imaging which limits fine detail. Moderate volume stool in the ascending and transverse colon. Moderate volume stool in the descending colon. Large volume of stool in the rectal vault. Large stool ball in the rectal vault measures 10.4 x 9.4 x12.5 cm (volume = 640 cm^3). No pneumatosis or portal venous gas.  No intraperitoneal free air. Vascular/Lymphatic: Abdominal aorta is normal caliber with atherosclerotic calcification. There is no retroperitoneal or periportal lymphadenopathy. No pelvic lymphadenopathy. Reproductive: Uterus and adnexa unremarkable. Other: No free fluid. Musculoskeletal: No aggressive osseous lesion. IMPRESSION: 1. Dominant finding is expansion of the rectal vault by large stool ball. Moderate volume stool in LEFT and RIGHT colon consistent with constipation. 2. No evidence of bowel obstruction. Electronically Signed   By: Genevive Bi M.D.   On: 06/07/2021 11:17   CT Head Wo Contrast  Result Date: 06/07/2021 CLINICAL DATA:  Possible fall. EXAM: CT HEAD WITHOUT CONTRAST CT CERVICAL SPINE WITHOUT CONTRAST TECHNIQUE: Multidetector CT imaging of the head and cervical spine was performed following the standard protocol without intravenous contrast. Multiplanar CT image reconstructions of the cervical spine were also generated. RADIATION DOSE REDUCTION: This exam was  performed according to the departmental dose-optimization program which includes automated exposure control, adjustment of the mA and/or kV according to patient size and/or use of iterative reconstruction technique. COMPARISON:  CT head dated May 09, 2021. CT head and cervical spine dated April 25, 2021. FINDINGS: CT HEAD FINDINGS Limited by motion artifact. Brain: No evidence of acute infarction, hemorrhage, hydrocephalus, extra-axial collection or mass lesion/mass effect. Stable severe atrophy and moderate chronic microvascular ischemic changes. Vascular: Atherosclerotic vascular calcification of the carotid siphons. No hyperdense vessel. Skull: Normal. Negative for fracture or focal lesion. Sinuses/Orbits: No acute finding. Other: None. CT CERVICAL SPINE FINDINGS Alignment: No traumatic malalignment. Unchanged trace degenerative anterolisthesis at C2-C3, C3-C4, C6-C7, and C7-T1. Skull base and vertebrae: No acute fracture. No primary bone lesion or focal pathologic process. Prior C5-C7 ACDF with incomplete fusion. Soft tissues and spinal canal: No prevertebral fluid or swelling. No visible canal hematoma. Disc levels: Similar severe disc height loss and moderate uncovertebral hypertrophy at C4-C5. Unchanged severe left and moderate right facet arthropathy at C2-C3 and C3-C4. Unchanged moderate bilateral facet arthropathy at C7-T1. Upper chest: Similar scarring at the right lung apex. Other: None. IMPRESSION: 1. Motion limited CT head without definite acute intracranial abnormality. Stable severe atrophy and moderate chronic microvascular ischemic changes. 2. No acute cervical spine fracture. Electronically Signed   By: Obie Dredge M.D.   On: 06/07/2021 09:21   CT Cervical Spine Wo Contrast  Result Date: 06/07/2021 CLINICAL DATA:  Possible fall. EXAM: CT HEAD WITHOUT CONTRAST  CT CERVICAL SPINE WITHOUT CONTRAST TECHNIQUE: Multidetector CT imaging of the head and cervical spine was performed following  the standard protocol without intravenous contrast. Multiplanar CT image reconstructions of the cervical spine were also generated. RADIATION DOSE REDUCTION: This exam was performed according to the departmental dose-optimization program which includes automated exposure control, adjustment of the mA and/or kV according to patient size and/or use of iterative reconstruction technique. COMPARISON:  CT head dated May 09, 2021. CT head and cervical spine dated April 25, 2021. FINDINGS: CT HEAD FINDINGS Limited by motion artifact. Brain: No evidence of acute infarction, hemorrhage, hydrocephalus, extra-axial collection or mass lesion/mass effect. Stable severe atrophy and moderate chronic microvascular ischemic changes. Vascular: Atherosclerotic vascular calcification of the carotid siphons. No hyperdense vessel. Skull: Normal. Negative for fracture or focal lesion. Sinuses/Orbits: No acute finding. Other: None. CT CERVICAL SPINE FINDINGS Alignment: No traumatic malalignment. Unchanged trace degenerative anterolisthesis at C2-C3, C3-C4, C6-C7, and C7-T1. Skull base and vertebrae: No acute fracture. No primary bone lesion or focal pathologic process. Prior C5-C7 ACDF with incomplete fusion. Soft tissues and spinal canal: No prevertebral fluid or swelling. No visible canal hematoma. Disc levels: Similar severe disc height loss and moderate uncovertebral hypertrophy at C4-C5. Unchanged severe left and moderate right facet arthropathy at C2-C3 and C3-C4. Unchanged moderate bilateral facet arthropathy at C7-T1. Upper chest: Similar scarring at the right lung apex. Other: None. IMPRESSION: 1. Motion limited CT head without definite acute intracranial abnormality. Stable severe atrophy and moderate chronic microvascular ischemic changes. 2. No acute cervical spine fracture. Electronically Signed   By: Obie Dredge M.D.   On: 06/07/2021 09:21   DG Chest Portable 1 View  Result Date: 06/07/2021 CLINICAL DATA:   Fall, initial encounter. EXAM: PORTABLE CHEST 1 VIEW COMPARISON:  09/17/2020. FINDINGS: Trachea is midline. Heart size normal. No airspace consolidation or pleural fluid. Osseous structures appear grossly intact. Midthoracic vertebral body augmentations. IMPRESSION: No acute findings. Electronically Signed   By: Leanna Battles M.D.   On: 06/07/2021 09:24   DG Hip Unilat W or Wo Pelvis 2-3 Views Right  Result Date: 06/07/2021 CLINICAL DATA:  Fall, holding right hip, initial encounter. EXAM: DG HIP (WITH OR WITHOUT PELVIS) 2-3V RIGHT COMPARISON:  09/17/2020. FINDINGS: No fracture or dislocation. Degenerative changes in the visualized spine. Stool is seen throughout the colon with a large amount of stool in the rectum. IMPRESSION: 1. No fracture or dislocation. 2. Probable fecal impaction with constipation. Electronically Signed   By: Leanna Battles M.D.   On: 06/07/2021 09:25    Pertinent labs & imaging results that were available during my care of the patient were reviewed by me and considered in my medical decision making (see MDM for details).  Medications Ordered in ED Medications  dextrose 5 % solution (has no administration in time range)  Procedures .Critical Care Performed by: Glendora ScoreKommor, Philomena Buttermore, MD Authorized by: Glendora ScoreKommor, Garan Frappier, MD   Critical care provider statement:    Critical care time (minutes):  30   Critical care was necessary to treat or prevent imminent or life-threatening deterioration of the following conditions:  Dehydration and endocrine crisis   Critical care was time spent personally by me on the following activities:  Development of treatment plan with patient or surrogate, discussions with consultants, evaluation of patient's response to treatment, examination of patient, ordering and review of laboratory studies, ordering and review of  radiographic studies, ordering and performing treatments and interventions, pulse oximetry, re-evaluation of patient's condition and review of old charts  (including critical care time)  Medical Decision Making / ED Course   This patient presents to the ED for concern of altered mental status, this involves an extensive number of treatment options, and is a complaint that carries with it a high risk of complications and morbidity.  The differential diagnosis includes electrolyte abnormality, UTI, constipation, fracture  MDM: Patient seen emergency department for evaluation of altered medical status.  Physical exam reveals an ill-appearing elderly female who expresses discomfort anytime she is touched anywhere.  As she initially came in grabbing her right hip, x-rays of the chest and pelvis were obtained as well as CT head and C-spine that did not show any traumatic injury but does show evidence of a possible fecal impaction.  CT scan was obtained that confirms a large stool ball and evidence of constipation.  Laboratory evaluation with a mild leukocytosis to 11.7 but a significant hyponatremia to 163 with a BUN of 76 and creatinine 1.51.  Patient likely dehydrated and discomfort likely due to hyponatremia and constipation.  Enema ordered and patient admitted to medicine.   Additional history obtained: -Additional history obtained from husband -External records from outside source obtained and reviewed including: Chart review including previous notes, labs, imaging, consultation notes   Lab Tests: -I ordered, reviewed, and interpreted labs.   The pertinent results include:   Labs Reviewed  CBC WITH DIFFERENTIAL/PLATELET - Abnormal; Notable for the following components:      Result Value   WBC 11.7 (*)    HCT 47.6 (*)    Neutro Abs 8.7 (*)    All other components within normal limits  COMPREHENSIVE METABOLIC PANEL - Abnormal; Notable for the following components:   Sodium 163 (*)     Chloride 130 (*)    Glucose, Bld 123 (*)    BUN 76 (*)    Creatinine, Ser 1.51 (*)    ALT 54 (*)    GFR, Estimated 35 (*)    All other components within normal limits  URINALYSIS, ROUTINE W REFLEX MICROSCOPIC  SODIUM  SODIUM  SODIUM        Imaging Studies ordered: I ordered imaging studies including x-ray chest, pelvis, hip, CT abdomen pelvis I independently visualized and interpreted imaging. I agree with the radiologist interpretation   Medicines ordered and prescription drug management: Meds ordered this encounter  Medications   dextrose 5 % solution    -I have reviewed the patients home medicines and have made adjustments as needed    Cardiac Monitoring: The patient was maintained on a cardiac monitor.  I personally viewed and interpreted the cardiac monitored which showed an underlying rhythm of: NSR  Social Determinants of Health:  Factors impacting patients care include: Severe dementia   Reevaluation: After the interventions noted above, I reevaluated the patient and found that they  have :stayed the same  Co morbidities that complicate the patient evaluation  Past Medical History:  Diagnosis Date   Alzheimer disease (HCC)    Arthritis    Dementia (HCC)    Alzheimers   Depression    H/O tubal ligation    Hx of cervical spine surgery    Hyperthyroidism 06/2014   Hypothyroid    Hypothyroidism    Myocardial infarction [facility](HCC)    Osteoporosis    Vitamin B12 deficiency       Dispostion: admit     Final Clinical Impression(s) / ED Diagnoses Final diagnoses:  None     @PCDICTATION @    Glendora ScoreKommor, Catcher Dehoyos, MD 06/07/21 1143

## 2021-06-07 NOTE — H&P (Signed)
History and Physical    [name] [id number] DOB: 21-Jun-1940 DOA: 06/07/2021  PCP: Patient, No Pcp Per (Inactive)  Patient coming from: SNF.  I have personally briefly reviewed patient's old medical records in [place]  Chief Complaint: Altered mental status.  HPI: [name] is a 81 y.o. female with medical history significant of Alzheimer's disease, osteoarthritis, depression, cervical spine surgery, hypothyroidism, hyperthyroidism, CAD, history of MI, osteoporosis, vitamin B12 deficiency who is [facility] due to altered mental status.  The facility is not sure but she may have had a fall last night.  She is unable to provide further history.  ED Course: Initial vital signs were temperature 97.7 F, pulse 73, respirations 16, BP 130/95 O2 sat 95% on room air.  Baseline creatinine most recently under 1.0 mg/dL  Lab work: Urinalysis was cloudy with small hemoglobinuria, 100 mg/dL proteinuria, positive nitrates, large leukocyte esterase, more than 50 WBC with many bacteria and presence of budding yeast.  CBC showed a white count of 11.7, hemoglobin 14.7 g/dL platelets 272.  CMP showed a sodium 163 and chloride 130 mmol/L.  The rest of the electrolytes were normal.  Glucose 123, BUN 76 and creatinine 1.51 mg/dL.  LFTs were normal, except for ALT of 54.  Imaging: CT abdomen/pelvis without contrast showed expansion of the rectal ball by large stool ball.  There was moderate volume stool in the left and right colon.  No bowel obstruction.  CT head, CT cervical spine, portable chest and right hip x-ray did not have any acute findings.  Please see images and full daily report for further details.  Review of Systems: As per HPI otherwise all other systems reviewed and are negative.  Past Medical History:  Diagnosis Date   Alzheimer disease ([place])    Arthritis    Dementia ([place])    Alzheimers   Depression    H/O  tubal ligation    Hx of cervical spine surgery    Hyperthyroidism 06/2014   Hypothyroid    Hypothyroidism    Myocardial infarction ([place])    Osteoporosis    Vitamin B12 deficiency     Past Surgical History:  Procedure Laterality Date   BREAST SURGERY     reduction   bunionectomies     FINGER ARTHROPLASTY  04/02/2012   Procedure: FINGER ARTHROPLASTY;  Surgeon: Rayvon Char. Grandville Silos, MD;  Location: [facility];  Service: Orthopedics;  Laterality: Left;  Left Thumb Suspension Arthroplasty    KYPHOPLASTY N/A 04/07/2016   Procedure: THORACIC 7 KYPHOPLASTY;  Surgeon: Phylliss Bob, MD;  Location: [facility];  Service: Orthopedics;  Laterality: N/A;  THORACIC 7 KYPHOPLASTY   REDUCTION MAMMAPLASTY     TONSILLECTOMY     TUBAL LIGATION      Social History  reports that she quit smoking about 43 years ago. Her smoking use included cigarettes. She has never used smokeless tobacco. She reports that she does not currently use alcohol. She reports that she does not use drugs.  Allergies  Allergen Reactions   Asa [Aspirin] Nausea And Vomiting and Other (See Comments)    Bleeding ulcers    Family History  Problem Relation Age of Onset   Cancer Mother        mesothelioma   Cancer Father        mesothelioma   Dementia Paternal Grandmother    Prior to Admission medications   Medication Sig Start Date End Date Taking?  Authorizing Provider  acetaminophen (TYLENOL) 500 MG tablet Take 500 mg by mouth 2 (two) times daily.    [provider]  loratadine (CLARITIN) 10 MG tablet Take 10 mg by mouth daily.    [provider]  Melatonin 3 MG TABS Take 3 mg by mouth at bedtime.    [provider]  methimazole (TAPAZOLE) 5 MG tablet Take 5 mg by mouth See admin instructions. 5mg  daily on Monday Wednesday Friday Sunday    [provider]  NON FORMULARY Apply 1 mL topically daily. Lorazepam PLO gel - Apply 1 ml (0.5 mg) topically daily    [provider]   OVER THE COUNTER MEDICATION Take 1 Container by mouth 3 (three) times daily. Supplemental shake    [provider]    Physical Exam: Vitals:   06/07/21 0930 06/07/21 0933 06/07/21 1030 06/07/21 1042  BP: (!) 148/117 (!) 148/117 138/76   Pulse: 73 81 71 70  Resp: 16 20 19    Temp:  97.7 F (36.5 C)    TempSrc:  Oral    SpO2: 98% 99% 99% 100%  Weight:      Height:       Constitutional: Chronically ill-appearing.  NAD, calm, comfortable Eyes: PERRL, lids and conjunctivae normal.  Mild bilateral conjunctival injection. ENMT: Mucous membranes and lips are very dry.  Posterior pharynx clear of any exudate or lesions. Neck: normal, supple, no masses, no thyromegaly Respiratory: Decreased breath sounds in bases, otherwise clear to auscultation bilaterally, no wheezing, no crackles. Normal respiratory effort. No accessory muscle use.  Cardiovascular: Regular rate and rhythm, no murmurs / rubs / gallops. No extremity edema. 2+ pedal pulses. No carotid bruits.  Abdomen: No distention.  Soft, no tenderness, no masses palpated. No hepatosplenomegaly. Bowel sounds positive.  Musculoskeletal: no clubbing / cyanosis. Good ROM, no contractures. Normal muscle tone.  Skin: no acute rashes, lesions, ulcers on very limited dermatological examination.  Unable to evaluate sacral area. Neurologic: CN 2-12 grossly intact.  Unable to fully evaluate. Psychiatric: Nonverbal.  Labs on Admission: I have personally reviewed following labs and imaging studies  CBC: Recent Labs  Lab 06/07/21 1008  WBC 11.7*  NEUTROABS 8.7*  HGB 14.7  HCT 47.6*  MCV 100.0  PLT [id number]    Basic Metabolic Panel: Recent Labs  Lab 06/07/21 1008 06/07/21 1300  NA 163* 159*  K 4.2  --   CL 130*  --   CO2 26  --   GLUCOSE 123*  --   BUN 76*  --   CREATININE 1.51*  --   CALCIUM 9.3  --     GFR: Estimated Creatinine Clearance: 23.1 mL/min (A) (by C-G formula based on SCr of 1.51 mg/dL (H)).  Liver Function  Tests: Recent Labs  Lab 06/07/21 1008  AST 30  ALT 54*  ALKPHOS 116  BILITOT 0.9  PROT 7.7  ALBUMIN 4.5    Urine analysis:    Component Value Date/Time   COLORURINE YELLOW 06/07/2021 0937   APPEARANCEUR CLOUDY (A) 06/07/2021 0937   LABSPEC 1.018 06/07/2021 [place] 7.0 06/07/2021 0937   GLUCOSEU NEGATIVE 06/07/2021 0937   HGBUR SMALL (A) 06/07/2021 0937   BILIRUBINUR NEGATIVE 06/07/2021 [place] 06/07/2021 0937   PROTEINUR 100 (A) 06/07/2021 0937   NITRITE POSITIVE (A) 06/07/2021 0937   LEUKOCYTESUR LARGE (A) 06/07/2021 0937    Radiological Exams on Admission: CT ABDOMEN PELVIS WO CONTRAST  Result Date: 06/07/2021 CLINICAL DATA:  Bowel obstruction  suspected EXAM: CT ABDOMEN AND PELVIS WITHOUT CONTRAST TECHNIQUE: Multidetector CT imaging of the abdomen and pelvis was performed following the standard protocol without IV contrast. RADIATION DOSE REDUCTION: This exam was performed according to the departmental dose-optimization program which includes automated exposure control, adjustment of the mA and/or kV according to patient size and/or use of iterative reconstruction technique. COMPARISON:  None. FINDINGS: Lower chest: Lung bases are clear. Hepatobiliary: No focal hepatic lesion. No biliary duct dilatation. Common bile duct is normal. Pancreas: Pancreas is normal. No ductal dilatation. No pancreatic inflammation. Spleen: Normal spleen Adrenals/urinary tract: Adrenal glands and kidneys are normal. The ureters and bladder normal. Stomach/Bowel: Stomach small bowel normal. No small bowel dilatation. Appendix is not identified. There is motion degradation of the imaging which limits fine detail. Moderate volume stool in the ascending and transverse colon. Moderate volume stool in the descending colon. Large volume of stool in the rectal vault. Large stool ball in the rectal vault measures 10.4 x 9.4 x12.5 cm (volume = 640 cm^3). No pneumatosis or portal venous gas.   No intraperitoneal free air. Vascular/Lymphatic: Abdominal aorta is normal caliber with atherosclerotic calcification. There is no retroperitoneal or periportal lymphadenopathy. No pelvic lymphadenopathy. Reproductive: Uterus and adnexa unremarkable. Other: No free fluid. Musculoskeletal: No aggressive osseous lesion. IMPRESSION: 1. Dominant finding is expansion of the rectal vault by large stool ball. Moderate volume stool in LEFT and RIGHT colon consistent with constipation. 2. No evidence of bowel obstruction. Electronically Signed   By: Suzy Bouchard M.D.   On: 06/07/2021 11:17   CT Head Wo Contrast  Result Date: 06/07/2021 CLINICAL DATA:  Possible fall. EXAM: CT HEAD WITHOUT CONTRAST CT CERVICAL SPINE WITHOUT CONTRAST TECHNIQUE: Multidetector CT imaging of the head and cervical spine was performed following the standard protocol without intravenous contrast. Multiplanar CT image reconstructions of the cervical spine were also generated. RADIATION DOSE REDUCTION: This exam was performed according to the departmental dose-optimization program which includes automated exposure control, adjustment of the mA and/or kV according to patient size and/or use of iterative reconstruction technique. COMPARISON:  CT head dated May 09, 2021. CT head and cervical spine dated April 25, 2021. FINDINGS: CT HEAD FINDINGS Limited by motion artifact. Brain: No evidence of acute infarction, hemorrhage, hydrocephalus, extra-axial collection or mass lesion/mass effect. Stable severe atrophy and moderate chronic microvascular ischemic changes. Vascular: Atherosclerotic vascular calcification of the carotid siphons. No hyperdense vessel. Skull: Normal. Negative for fracture or focal lesion. Sinuses/Orbits: No acute finding. Other: None. CT CERVICAL SPINE FINDINGS Alignment: No traumatic malalignment. Unchanged trace degenerative anterolisthesis at C2-C3, C3-C4, C6-C7, and C7-T1. Skull base and vertebrae: No acute fracture.  No primary bone lesion or focal pathologic process. Prior C5-C7 ACDF with incomplete fusion. Soft tissues and spinal canal: No prevertebral fluid or swelling. No visible canal hematoma. Disc levels: Similar severe disc height loss and moderate uncovertebral hypertrophy at C4-C5. Unchanged severe left and moderate right facet arthropathy at C2-C3 and C3-C4. Unchanged moderate bilateral facet arthropathy at C7-T1. Upper chest: Similar scarring at the right lung apex. Other: None. IMPRESSION: 1. Motion limited CT head without definite acute intracranial abnormality. Stable severe atrophy and moderate chronic microvascular ischemic changes. 2. No acute cervical spine fracture. Electronically Signed   By: Titus Dubin M.D.   On: 06/07/2021 09:21   CT Cervical Spine Wo Contrast  Result Date: 06/07/2021 CLINICAL DATA:  Possible fall. EXAM: CT HEAD WITHOUT CONTRAST CT CERVICAL SPINE WITHOUT CONTRAST TECHNIQUE: Multidetector CT imaging of the head and cervical spine  was performed following the standard protocol without intravenous contrast. Multiplanar CT image reconstructions of the cervical spine were also generated. RADIATION DOSE REDUCTION: This exam was performed according to the departmental dose-optimization program which includes automated exposure control, adjustment of the mA and/or kV according to patient size and/or use of iterative reconstruction technique. COMPARISON:  CT head dated May 09, 2021. CT head and cervical spine dated April 25, 2021. FINDINGS: CT HEAD FINDINGS Limited by motion artifact. Brain: No evidence of acute infarction, hemorrhage, hydrocephalus, extra-axial collection or mass lesion/mass effect. Stable severe atrophy and moderate chronic microvascular ischemic changes. Vascular: Atherosclerotic vascular calcification of the carotid siphons. No hyperdense vessel. Skull: Normal. Negative for fracture or focal lesion. Sinuses/Orbits: No acute finding. Other: None. CT CERVICAL SPINE  FINDINGS Alignment: No traumatic malalignment. Unchanged trace degenerative anterolisthesis at C2-C3, C3-C4, C6-C7, and C7-T1. Skull base and vertebrae: No acute fracture. No primary bone lesion or focal pathologic process. Prior C5-C7 ACDF with incomplete fusion. Soft tissues and spinal canal: No prevertebral fluid or swelling. No visible canal hematoma. Disc levels: Similar severe disc height loss and moderate uncovertebral hypertrophy at C4-C5. Unchanged severe left and moderate right facet arthropathy at C2-C3 and C3-C4. Unchanged moderate bilateral facet arthropathy at C7-T1. Upper chest: Similar scarring at the right lung apex. Other: None. IMPRESSION: 1. Motion limited CT head without definite acute intracranial abnormality. Stable severe atrophy and moderate chronic microvascular ischemic changes. 2. No acute cervical spine fracture. Electronically Signed   By: Titus Dubin M.D.   On: 06/07/2021 09:21   DG Chest Portable 1 View  Result Date: 06/07/2021 CLINICAL DATA:  Fall, initial encounter. EXAM: PORTABLE CHEST 1 VIEW COMPARISON:  09/17/2020. FINDINGS: Trachea is midline. Heart size normal. No airspace consolidation or pleural fluid. Osseous structures appear grossly intact. Midthoracic vertebral body augmentations. IMPRESSION: No acute findings. Electronically Signed   By: Lorin Picket M.D.   On: 06/07/2021 09:24   DG Hip Unilat W or Wo Pelvis 2-3 Views Right  Result Date: 06/07/2021 CLINICAL DATA:  Fall, holding right hip, initial encounter. EXAM: DG HIP (WITH OR WITHOUT PELVIS) 2-3V RIGHT COMPARISON:  09/17/2020. FINDINGS: No fracture or dislocation. Degenerative changes in the visualized spine. Stool is seen throughout the colon with a large amount of stool in the rectum. IMPRESSION: 1. No fracture or dislocation. 2. Probable fecal impaction with constipation. Electronically Signed   By: Lorin Picket M.D.   On: 06/07/2021 09:25    EKG: Independently reviewed.    Assessment/Plan Principal Problem:   Hypernatremia Due to poor oral intake. Due to decreased mentation. In the setting of   Acute lower UTI Observation/PCU. Begin rehydration with dextrose 5%. Follow-up sodium closely. Begin ceftriaxone 1 g IVPB. Follow-up urine culture and sensitivity.  Active Problems:   AKI (acute kidney injury) ([place]) In the setting of   Dehydration Continue IV fluids. Monitor intake and output. Avoid hypotension. Avoid nephrotoxins. Follow-up renal function electrolytes.    Alzheimer's disease ([place]) Supportive care.    Hyperthyroidism Resume methimazole once able to swallow safely.    DVT prophylaxis: Lovenox SQ. Code Status:   Full code. Family Communication:   Disposition Plan:   Patient is from:  SNF.  Anticipated DC to:  SNF.  Anticipated DC date:  06/09/2021.  Anticipated DC barriers: Clinical status.  Consults called:   Admission status:  Ulceration/progressive.  Severity of Illness: High severity in the setting of hypernatremia with AKI in the setting of urine tract infection.Reubin Milan MD [facility]  How to contact the [facility] Attending or Consulting provider [place] or covering provider during after hours [place], for this patient?   Check the care team in [facility] and look for a) attending/consulting [facility] provider listed and b) the [facility] team listed Log into [email or web address] and use [place] universal password to access. If you do not have the password, please contact the hospital operator. Locate the [employer] provider you are looking for under [facility] and page to a number that you can be directly reached. If you still have difficulty reaching the provider, please page the [facility] (Director on Call) for the Hospitalists listed on amion for assistance.  06/07/2021, 1:43 PM   This document was prepared in Dragon voice recognition software may contain some unintended transcription errors.

## 2021-06-08 ENCOUNTER — Encounter (HOSPITAL_COMMUNITY): Payer: Self-pay | Admitting: Internal Medicine

## 2021-06-08 ENCOUNTER — Other Ambulatory Visit: Payer: Self-pay

## 2021-06-08 DIAGNOSIS — M810 Age-related osteoporosis without current pathological fracture: Secondary | ICD-10-CM | POA: Diagnosis present

## 2021-06-08 DIAGNOSIS — E860 Dehydration: Secondary | ICD-10-CM | POA: Diagnosis present

## 2021-06-08 DIAGNOSIS — E872 Acidosis, unspecified: Secondary | ICD-10-CM | POA: Diagnosis present

## 2021-06-08 DIAGNOSIS — E861 Hypovolemia: Secondary | ICD-10-CM | POA: Diagnosis present

## 2021-06-08 DIAGNOSIS — E0590 Thyrotoxicosis, unspecified without thyrotoxic crisis or storm: Secondary | ICD-10-CM | POA: Diagnosis present

## 2021-06-08 DIAGNOSIS — N179 Acute kidney failure, unspecified: Secondary | ICD-10-CM | POA: Diagnosis present

## 2021-06-08 DIAGNOSIS — F02C18 Dementia in other diseases classified elsewhere, severe, with other behavioral disturbance: Secondary | ICD-10-CM | POA: Diagnosis present

## 2021-06-08 DIAGNOSIS — Z20822 Contact with and (suspected) exposure to covid-19: Secondary | ICD-10-CM | POA: Diagnosis present

## 2021-06-08 DIAGNOSIS — G309 Alzheimer's disease, unspecified: Secondary | ICD-10-CM | POA: Diagnosis present

## 2021-06-08 DIAGNOSIS — K5641 Fecal impaction: Secondary | ICD-10-CM | POA: Diagnosis present

## 2021-06-08 DIAGNOSIS — R296 Repeated falls: Secondary | ICD-10-CM | POA: Diagnosis present

## 2021-06-08 DIAGNOSIS — M1990 Unspecified osteoarthritis, unspecified site: Secondary | ICD-10-CM | POA: Diagnosis present

## 2021-06-08 DIAGNOSIS — R809 Proteinuria, unspecified: Secondary | ICD-10-CM | POA: Diagnosis present

## 2021-06-08 DIAGNOSIS — E870 Hyperosmolality and hypernatremia: Secondary | ICD-10-CM | POA: Diagnosis present

## 2021-06-08 DIAGNOSIS — R823 Hemoglobinuria: Secondary | ICD-10-CM | POA: Diagnosis present

## 2021-06-08 DIAGNOSIS — Z886 Allergy status to analgesic agent status: Secondary | ICD-10-CM | POA: Diagnosis not present

## 2021-06-08 DIAGNOSIS — N390 Urinary tract infection, site not specified: Secondary | ICD-10-CM | POA: Diagnosis present

## 2021-06-08 DIAGNOSIS — G9341 Metabolic encephalopathy: Secondary | ICD-10-CM | POA: Diagnosis present

## 2021-06-08 DIAGNOSIS — R5381 Other malaise: Secondary | ICD-10-CM | POA: Diagnosis present

## 2021-06-08 DIAGNOSIS — E538 Deficiency of other specified B group vitamins: Secondary | ICD-10-CM | POA: Diagnosis present

## 2021-06-08 DIAGNOSIS — F32A Depression, unspecified: Secondary | ICD-10-CM | POA: Diagnosis present

## 2021-06-08 DIAGNOSIS — G4710 Hypersomnia, unspecified: Secondary | ICD-10-CM | POA: Diagnosis present

## 2021-06-08 DIAGNOSIS — I2510 Atherosclerotic heart disease of native coronary artery without angina pectoris: Secondary | ICD-10-CM | POA: Diagnosis present

## 2021-06-08 DIAGNOSIS — B964 Proteus (mirabilis) (morganii) as the cause of diseases classified elsewhere: Secondary | ICD-10-CM | POA: Diagnosis present

## 2021-06-08 LAB — COMPREHENSIVE METABOLIC PANEL
ALT: 44 U/L (ref 0–44)
AST: 27 U/L (ref 15–41)
Albumin: 3.8 g/dL (ref 3.5–5.0)
Alkaline Phosphatase: 106 U/L (ref 38–126)
Anion gap: 6 (ref 5–15)
BUN: 59 mg/dL — ABNORMAL HIGH (ref 8–23)
CO2: 21 mmol/L — ABNORMAL LOW (ref 22–32)
Calcium: 8.6 mg/dL — ABNORMAL LOW (ref 8.9–10.3)
Chloride: 126 mmol/L — ABNORMAL HIGH (ref 98–111)
Creatinine, Ser: 1.12 mg/dL — ABNORMAL HIGH (ref 0.44–1.00)
GFR, Estimated: 50 mL/min — ABNORMAL LOW (ref >60)
Glucose, Bld: 104 mg/dL — ABNORMAL HIGH (ref 70–99)
Potassium: 3.6 mmol/L (ref 3.5–5.1)
Sodium: 153 mmol/L — ABNORMAL HIGH (ref 135–145)
Total Bilirubin: 0.8 mg/dL (ref 0.3–1.2)
Total Protein: 6.6 g/dL (ref 6.5–8.1)

## 2021-06-08 LAB — BASIC METABOLIC PANEL
Anion gap: 9 (ref 5–15)
BUN: 48 mg/dL — ABNORMAL HIGH (ref 8–23)
CO2: 23 mmol/L (ref 22–32)
Calcium: 8.4 mg/dL — ABNORMAL LOW (ref 8.9–10.3)
Chloride: 118 mmol/L — ABNORMAL HIGH (ref 98–111)
Creatinine, Ser: 1.13 mg/dL — ABNORMAL HIGH (ref 0.44–1.00)
GFR, Estimated: 50 mL/min — ABNORMAL LOW (ref >60)
Glucose, Bld: 118 mg/dL — ABNORMAL HIGH (ref 70–99)
Potassium: 3.7 mmol/L (ref 3.5–5.1)
Sodium: 150 mmol/L — ABNORMAL HIGH (ref 135–145)

## 2021-06-08 LAB — CBC
HCT: 43.2 % (ref 36.0–46.0)
Hemoglobin: 13.4 g/dL (ref 12.0–15.0)
MCH: 30.9 pg (ref 26.0–34.0)
MCHC: 31 g/dL (ref 30.0–36.0)
MCV: 99.8 fL (ref 80.0–100.0)
Platelets: 247 10*3/uL (ref 150–400)
RBC: 4.33 MIL/uL (ref 3.87–5.11)
RDW: 12.5 % (ref 11.5–15.5)
WBC: 12 10*3/uL — ABNORMAL HIGH (ref 4.0–10.5)
nRBC: 0 % (ref 0.0–0.2)

## 2021-06-08 MED ORDER — ENOXAPARIN SODIUM 40 MG/0.4ML IJ SOSY
40.0000 mg | PREFILLED_SYRINGE | INTRAMUSCULAR | Status: DC
  Administered 2021-06-08 – 2021-06-09 (×2): 40 mg via SUBCUTANEOUS
  Filled 2021-06-08 (×2): qty 0.4

## 2021-06-08 MED ORDER — DEXTROSE 5 % IV SOLN: INTRAVENOUS | Status: AC

## 2021-06-08 NOTE — Plan of Care (Signed)

## 2021-06-08 NOTE — Progress Notes (Addendum)
PROGRESS NOTE  [name] [id number] DOB: [birth year] DOA: 06/07/2021 PCP: Patient, No Pcp Per (Inactive)  HPI/Recap of past 24 hours:  [name] is a 81 y.o. female with medical history significant of Alzheimer's disease, osteoarthritis, depression, cervical spine surgery, hypothyroidism, hyperthyroidism, CAD, history of MI, osteoporosis, vitamin B12 deficiency who is coming to the emergency department from [facility] facility due to altered mental status.  Work-up revealed severe hypernatremia, dehydration, AKI and UTI.  06/08/2021: Patient was seen and examined with her husband at her bedside.  She is somnolent but arousable to voices.  Denies any pain.  Unable to obtain a history from her due to baseline dementia  Assessment/Plan: Principal Problem:   Hypernatremia Active Problems:   Alzheimer's disease ([place])   Hyperthyroidism   AKI (acute kidney injury) ([place])   Dehydration   Acute lower UTI   Acute metabolic encephalopathy, multifactorial, secondary to hyponatremia, dehydration, AKI, presumptive UTI. Treat underlying conditions Baseline dementia, reorient as needed Fall precautions, aspirations, delirium precaution  Hypovolemic hypernatremia secondary to poor oral intake with dehydration Presented with serum sodium of 163 Serum sodium 133 this morning, rate of D5W reduced to 50 cc/h. Encourage oral intake as much as possible. Continue to closely monitor serum sodium levels, avoid quick corrections.  AKI, prerenal in the setting of dehydration Baseline creatinine 0.8 with GFR greater than 60 Presented with creatinine of 1.51 with GFR of 35 AKI is resolving, creatinine 1.1 with GFR 58. Continue to avoid nephrotoxic agents, dehydration and hypotension Monitor urine output with strict I's and O's.  Mild non-anion gap metabolic acidosis Serum bicarb 21, anion gap 6, continue IV fluid hydration Repeat BMP in the morning  Presumptive UTI UA  positive for pyuria, WBC 12,000. Urine culture in process at time of this distention. Started on Rocephin, continue Continue to follow urine culture and narrow down antibiotics  Physical debility PT OT to assess Fall precautions  Critical care time: 65 minutes  Code Status: Full   Family Communication: Husband at bedside   Disposition Plan: SNF    Consultants: None   Procedures: None   Antimicrobials: Rocephin   DVT prophylaxis:  SQ Lovenox daily  Status is: Observation, will possibly be switched to inpatient.  Patient requires at least 2 midnights for further evaluation and treatment of present condition.        Objective: Vitals:   06/07/21 1730 06/07/21 1904 06/07/21 2337 06/08/21 0618  BP: 123/71 131/73 (!) 146/83 (!) 146/97  Pulse: 75 77 95 83  Resp: 15 18 20 20   Temp:  (!) 97.5 F (36.4 C) 98 F (36.7 C) 98 F (36.7 C)  TempSrc:  Axillary  Oral  SpO2: 100% 100% 90% 97%  Weight:      Height:        Intake/Output Summary (Last 24 hours) at 06/08/2021 1125 Last data filed at 06/08/2021 0600 Gross per 24 hour  Intake 1026.19 ml  Output 0 ml  Net 1026.19 ml   Filed Weights   06/07/21 0816  Weight: 47.6 kg    Exam:  General: 81 y.o. year-old female frail-appearing hypersomnolent.  Confused when she awakens to voices. Cardiovascular: Regular rate and rhythm with no rubs or gallops.  No thyromegaly or JVD noted.   Respiratory: Clear to auscultation with no wheezes or rales. Good inspiratory effort. Abdomen: Soft nontender nondistended with normal bowel sounds x4 quadrants. Musculoskeletal: No lower extremity edema. 2/4 pulses in all 4 extremities. Skin: No ulcerative lesions noted or  rashes, Psychiatry: Unable to assess mood due to hypersomnolence. Neuro: Moves all 4 extremities.   Data Reviewed: CBC: Recent Labs  Lab 06/07/21 1008 06/08/21 0047  WBC 11.7* 12.0*  NEUTROABS 8.7*  --   HGB 14.7 13.4  HCT 47.6* 43.2  MCV 100.0 99.8   PLT 272 [id number]   Basic Metabolic Panel: Recent Labs  Lab 06/07/21 1008 06/07/21 1300 06/07/21 1911 06/07/21 2207 06/08/21 0047  NA 163* 159* 155* 155* 153*  K 4.2  --   --   --  3.6  CL 130*  --   --   --  126*  CO2 26  --   --   --  21*  GLUCOSE 123*  --   --   --  104*  BUN 76*  --   --   --  59*  CREATININE 1.51*  --   --   --  1.12*  CALCIUM 9.3  --   --   --  8.6*   GFR: Estimated Creatinine Clearance: 31.1 mL/min (A) (by C-G formula based on SCr of 1.12 mg/dL (H)). Liver Function Tests: Recent Labs  Lab 06/07/21 1008 06/08/21 0047  AST 30 27  ALT 54* 44  ALKPHOS 116 106  BILITOT 0.9 0.8  PROT 7.7 6.6  ALBUMIN 4.5 3.8   No results for input(s): LIPASE, AMYLASE in the last 168 hours. No results for input(s): AMMONIA in the last 168 hours. Coagulation Profile: No results for input(s): INR, PROTIME in the last 168 hours. Cardiac Enzymes: No results for input(s): CKTOTAL, CKMB, CKMBINDEX, TROPONINI in the last 168 hours. BNP (last 3 results) No results for input(s): PROBNP in the last 8760 hours. HbA1C: No results for input(s): HGBA1C in the last 72 hours. CBG: No results for input(s): GLUCAP in the last 168 hours. Lipid Profile: No results for input(s): CHOL, HDL, LDLCALC, TRIG, CHOLHDL, LDLDIRECT in the last 72 hours. Thyroid Function Tests: No results for input(s): TSH, T4TOTAL, FREET4, T3FREE, THYROIDAB in the last 72 hours. Anemia Panel: No results for input(s): VITAMINB12, FOLATE, FERRITIN, TIBC, IRON, RETICCTPCT in the last 72 hours. Urine analysis:    Component Value Date/Time   COLORURINE YELLOW 06/07/2021 0937   APPEARANCEUR CLOUDY (A) 06/07/2021 0937   LABSPEC 1.018 06/07/2021 0937   PHURINE 7.0 06/07/2021 0937   GLUCOSEU NEGATIVE 06/07/2021 0937   HGBUR SMALL (A) 06/07/2021 0937   BILIRUBINUR NEGATIVE 06/07/2021 0937   KETONESUR NEGATIVE 06/07/2021 0937   PROTEINUR 100 (A) 06/07/2021 0937   NITRITE POSITIVE (A) 06/07/2021 0937   LEUKOCYTESUR  LARGE (A) 06/07/2021 0937   Sepsis Labs: @LABRCNTIP (procalcitonin:4,lacticidven:4)  ) Recent Results (from the past 240 hour(s))  Resp Panel by RT-PCR (Flu A&B, Covid) Nasopharyngeal Swab     Status: None   Collection Time: 06/07/21  4:56 PM   Specimen: Nasopharyngeal Swab; Nasopharyngeal(NP) swabs in vial transport medium  Result Value Ref Range Status   SARS Coronavirus 2 by RT PCR NEGATIVE NEGATIVE Final    Comment: (NOTE) SARS-CoV-2 target nucleic acids are NOT DETECTED.  The SARS-CoV-2 RNA is generally detectable in upper respiratory specimens during the acute phase of infection. The lowest concentration of SARS-CoV-2 viral copies this assay can detect is 138 copies/mL. A negative result does not preclude SARS-Cov-2 infection and should not be used as the sole basis for treatment or other patient management decisions. A negative result may occur with  improper specimen collection/handling, submission of specimen other than nasopharyngeal swab, presence of viral mutation(s) within the areas  targeted by this assay, and inadequate number of viral copies(<138 copies/mL). A negative result must be combined with clinical observations, patient history, and epidemiological information. The expected result is Negative.  Fact Sheet for Patients:  EntrepreneurPulse.com.au  Fact Sheet for Healthcare Providers:  IncredibleEmployment.be  This test is no t yet approved or cleared by the [place] FDA and  has been authorized for detection and/or diagnosis of SARS-CoV-2 by FDA under an Emergency Use Authorization (EUA). This EUA will remain  in effect (meaning this test can be used) for the duration of the COVID-19 declaration under Section 564(b)(1) of the Act, 21 U.S.C.section 360bbb-3(b)(1), unless the authorization is terminated  or revoked sooner.       Influenza A by PCR NEGATIVE NEGATIVE Final   Influenza B by PCR NEGATIVE NEGATIVE Final     Comment: (NOTE) The Xpert Xpress SARS-CoV-2/FLU/RSV plus assay is intended as an aid in the diagnosis of influenza from Nasopharyngeal swab specimens and should not be used as a sole basis for treatment. Nasal washings and aspirates are unacceptable for Xpert Xpress SARS-CoV-2/FLU/RSV testing.  Fact Sheet for Patients: EntrepreneurPulse.com.au  Fact Sheet for Healthcare Providers: IncredibleEmployment.be  This test is not yet approved or cleared by the [place] FDA and has been authorized for detection and/or diagnosis of SARS-CoV-2 by FDA under an Emergency Use Authorization (EUA). This EUA will remain in effect (meaning this test can be used) for the duration of the COVID-19 declaration under Section 564(b)(1) of the Act, 21 U.S.C. section 360bbb-3(b)(1), unless the authorization is terminated or revoked.  Performed at [facility], [place] [address] [place], [place] [address]       Studies: No results found.  Scheduled Meds:  enoxaparin (LOVENOX) injection  40 mg Subcutaneous Q24H    Continuous Infusions:  cefTRIAXone (ROCEPHIN)  IV Stopped (06/07/21 1458)   dextrose 50 mL/hr at 06/08/21 0643     LOS: 0 days     Kayleen Memos, MD [facility] Pager [phone]  If 7PM-7AM, please contact night-coverage [email or web address] Password [facility] 06/08/2021, 11:25 AM

## 2021-06-08 NOTE — TOC Initial Note (Signed)
Transition of Care [facility] - Initial/Assessment Note    Patient Details  Name: [name] MRN: [id number] Date of Birth: 01/24/1941  Transition of Care [facility]) CM/SW Contact:    Leeroy Cha, RN Phone Number: 06/08/2021, 9:53 AM  Clinical Narrative:                  Transition of Care [facility]) Screening Note   Patient Details  Name: [name] Date of Birth: 1940/12/16   Transition of Care [facility]) CM/SW Contact:    Leeroy Cha, RN Phone Number: 06/08/2021, 9:54 AM    Transition of Care Department [facility]) has reviewed patient and no TOC needs have been identified at this time. We will continue to monitor patient advancement through interdisciplinary progression rounds. If new patient transition needs arise, please place a TOC consult.    Expected Discharge Plan: Home/Self Care Barriers to Discharge: Continued Medical Work up   Patient Goals and CMS Choice Patient states their goals for this hospitalization and ongoing recovery are:: to go home CMS Medicare.gov Compare Post Acute Care list provided to:: Patient    Expected Discharge Plan and Services Expected Discharge Plan: Home/Self Care   Discharge Planning Services: CM Consult   Living arrangements for the past 2 months: Single Family Home                                      Prior Living Arrangements/Services Living arrangements for the past 2 months: Single Family Home Lives with:: Spouse Patient language and need for interpreter reviewed:: Yes Do you feel safe going back to the place where you live?: Yes            Criminal Activity/Legal Involvement Pertinent to Current Situation/Hospitalization: No - Comment as needed  Activities of Daily Living Home Assistive Devices/Equipment: Gilford Rile (specify type) ADL Screening (condition at time of admission) Patient's cognitive ability adequate to safely complete daily activities?: No Is the patient deaf or have difficulty hearing?:  No Does the patient have difficulty seeing, even when wearing glasses/contacts?: No Does the patient have difficulty concentrating, remembering, or making decisions?: Yes Patient able to express need for assistance with ADLs?: No Does the patient have difficulty dressing or bathing?: Yes Independently performs ADLs?: No Communication: Needs assistance Is this a change from baseline?: Pre-admission baseline Dressing (OT): Needs assistance Is this a change from baseline?: Pre-admission baseline Grooming: Needs assistance Is this a change from baseline?: Pre-admission baseline Feeding: Needs assistance Is this a change from baseline?: Pre-admission baseline Bathing: Needs assistance Is this a change from baseline?: Pre-admission baseline Toileting: Needs assistance Is this a change from baseline?: Pre-admission baseline In/Out Bed: Needs assistance Is this a change from baseline?: Pre-admission baseline Walks in Home: Needs assistance Is this a change from baseline?: Pre-admission baseline Does the patient have difficulty walking or climbing stairs?: Yes Weakness of Legs: Both Weakness of Arms/Hands: Both  Permission Sought/Granted                  Emotional Assessment Appearance:: Appears stated age Attitude/Demeanor/Rapport: Engaged Affect (typically observed): Calm Orientation: : Oriented to Place, Oriented to Self, Oriented to  Time, Oriented to Situation Alcohol / Substance Use: Not Applicable Psych Involvement: No (comment)  Admission diagnosis:  Hypernatremia [E87.0] Constipation, unspecified constipation type [K59.00] Patient Active Problem List   Diagnosis Date Noted   Hypernatremia 06/07/2021   AKI (acute kidney injury) ([place])  06/07/2021   Dehydration 06/07/2021   Acute lower UTI 06/07/2021   Pure hypercholesterolemia 05/14/2018   Hyperthyroidism 03/29/2018   Weight loss 03/29/2018   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   Alzheimer's disease ([place])  12/31/2012   Other vitamin B12 deficiency anemia 12/31/2012   PCP:  Patient, No Pcp Per (Inactive) Pharmacy:   [facility] [place] [address] Phone: [phone] Fax: [phone]     Social Determinants of Health (SDOH) Interventions    Readmission Risk Interventions No flowsheet data found.

## 2021-06-08 NOTE — Plan of Care (Signed)
Problem: Health Behavior/Discharge Planning: Goal: Ability to manage health-related needs will improve Outcome: Progressing   Problem: Clinical Measurements: Goal: Diagnostic test results will improve Outcome: Progressing   Problem: Education: Goal: Knowledge of General Education information will improve Description: Including pain rating scale, medication(s)/side effects and non-pharmacologic comfort measures 06/08/2021 1437 by Zadie Rhine, RN Outcome: Not Progressing 06/08/2021 1431 by Zadie Rhine, RN Outcome: Progressing   Problem: Clinical Measurements: Goal: Ability to maintain clinical measurements within normal limits will improve 06/08/2021 1437 by Zadie Rhine, RN Outcome: Not Met (add Reason) 06/08/2021 1431 by Zadie Rhine, RN Outcome: Progressing Goal: Will remain free from infection 06/08/2021 1437 by Zadie Rhine, RN Outcome: Not Met (add Reason) 06/08/2021 1431 by Zadie Rhine, RN Outcome: Progressing

## 2021-06-08 NOTE — Evaluation (Addendum)
Clinical/Bedside Swallow Evaluation Patient Details  Name: [name] MRN: [id number] Date of Birth: 1940/07/03  Today's Date: 06/08/2021 Time: SLP Start Time (ACUTE ONLY): 1800 SLP Stop Time (ACUTE ONLY): 1830 SLP Time Calculation (min) (ACUTE ONLY): 30 min  Past Medical History:  Past Medical History:  Diagnosis Date   Alzheimer disease (HCC)    Arthritis    Dementia (HCC)    Alzheimers   Depression    H/O tubal ligation    Hx of cervical spine surgery    Hyperthyroidism 06/2014   Hypothyroid    Hypothyroidism    Myocardial infarction (HCC)    Osteoporosis    Vitamin B12 deficiency    Past Surgical History:  Past Surgical History:  Procedure Laterality Date   BREAST SURGERY     reduction   bunionectomies     FINGER ARTHROPLASTY  04/02/2012   Procedure: FINGER ARTHROPLASTY;  Surgeon: Cliffton Asters. [name], MD;  [facility];  Service: Orthopedics;  Laterality: Left;  Left Thumb Suspension Arthroplasty    KYPHOPLASTY N/A 04/07/2016   Procedure: THORACIC 7 KYPHOPLASTY;  Surgeon: Estill Bamberg, MD;  Location: [facility] OR;  Service: Orthopedics;  Laterality: N/A;  THORACIC 7 KYPHOPLASTY   REDUCTION MAMMAPLASTY     TONSILLECTOMY     TUBAL LIGATION     HPI:  81 yo female with h/o advanced dementia - resident of [place] - adm with AMS - lower UTI diagnosed.  Pt was initially started on clear liquid diet - She was also noted to have constipation, AKi with UTI, dehydration.  Swqallow evaluation ordered - RN reports spouse was interested in diet advancement.  Records from [place] do not reveal premorbid diet.  Pt had prior ACDF C5-C7.    Assessment / Plan / Recommendation  Clinical Impression  Patient presents with cognitive based dysphagia - resulting in tonic bite on straw and attempt on spoon.   She did accept intake of graham cracker *self feeding with assist to reach oral cavity due to limb apraxia*, Ensure, icecream and soda.  Subtle cough x1 with  larger boluses of thin but no indication of aspiration with small single boluses.   No oral retention but prolonged vertical mastication pattern with solid noted - use of liquids assisted to clear.  Recommend puree/thin diet and SLP will follow up next date - as hopefully pt appropriate for diet advancement as she medically improves.  Posted swallow precaution signs and tonic bite mitigation strategies and set up oral suction to use for emergent purposes.  Brushed pt's teeth after intake.  Strict precautions advised. SLP Visit Diagnosis: Dysphagia, oral phase (R13.11)    Aspiration Risk  Mild aspiration risk    Diet Recommendation Dysphagia 1 (Puree);Thin liquid   Liquid Administration via: Straw Medication Administration: Crushed with puree Supervision: Staff to assist with self feeding Compensations: Slow rate;Small sips/bites Postural Changes: Seated upright at 90 degrees;Remain upright for at least 30 minutes after po intake    Other  Recommendations Oral Care Recommendations: Oral care before and after PO (after po, assure oral clearance) Other Recommendations: Have oral suction available (slp set up)    Recommendations for follow up therapy are one component of a multi-disciplinary discharge planning process, led by the attending physician.  Recommendations may be updated based on patient status, additional functional criteria and insurance authorization.  Follow up Recommendations Skilled nursing-short term rehab (<3 hours/day)  tbd    Assistance Recommended at Discharge Frequent or constant Supervision/Assistancefrequent  Functional Status Assessment  Patient has had a recent decline in their functional status and demonstrates the ability to make significant improvements in function in a reasonable and predictable amount of time.  Frequency and Duration min 1 x/week  1 week       Prognosis Prognosis for Safe Diet Advancement: Fair      Swallow Study   General Date of Onset:  06/08/21 HPI: 81 yo female with h/o advanced dementia - resident of [place] - adm with AMS - lower UTI diagnosed.  Pt was initially started on clear liquid diet - She was also noted to have constipation, AKi with UTI, dehydration.  Swqallow evaluation ordered - RN reports spouse was interested in diet advancement.  Records from [place] do not reveal premorbid diet. Type of Study: Bedside Swallow Evaluation Previous Swallow Assessment: none in epic Diet Prior to this Study: Thin liquids;Dysphagia 1 (puree) Temperature Spikes Noted: No Respiratory Status: Room air History of Recent Intubation: No Behavior/Cognition: Alert;Distractible;Doesn't follow directions Oral Cavity Assessment: Dry Oral Care Completed by SLP: Yes (brushed pt's teeth following evaluation) Oral Cavity - Dentition: Adequate natural dentition Self-Feeding Abilities: Needs assist Patient Positioning: Upright in bed Baseline Vocal Quality: Normal Volitional Cough: Cognitively unable to elicit Volitional Swallow: Unable to elicit    Oral/Motor/Sensory Function Overall Oral Motor/Sensory Function: Generalized oral weakness   Ice Chips Ice chips: Not tested   Thin Liquid Thin Liquid: Impaired Oral Phase Impairments: Reduced labial seal;Other (comment) Oral Phase Functional Implications: Other (comment) (pt bit down on straw x1) Pharyngeal  Phase Impairments: Suspected delayed Swallow    Nectar Thick Nectar Thick Liquid: Impaired Presentation: Straw Pharyngeal Phase Impairments: Cough - Delayed Other Comments: delayed cough x1, with controlled boluses - no indication of aspiration   Honey Thick Honey Thick Liquid: Within functional limits Presentation: Straw   Puree Puree: Within functional limits Presentation: Spoon Other Comments: placed at anterior oral cavity   Solid     Solid: Impaired Presentation: Self Fed Oral Phase Impairments: Impaired mastication (vertical not rotary mastication pattern  noted) Oral Phase Functional Implications: Other (comment) Other Comments: trace oral retention cleared with liquids      Chales Abrahams 06/08/2021,6:40 PM  Rolena Infante, MS [facility] SLP Acute Rehab Services Office [phone] Cell [phone]

## 2021-06-09 LAB — CBC
HCT: 39 % (ref 36.0–46.0)
Hemoglobin: 12.6 g/dL (ref 12.0–15.0)
MCH: 31.3 pg (ref 26.0–34.0)
MCHC: 32.3 g/dL (ref 30.0–36.0)
MCV: 97 fL (ref 80.0–100.0)
Platelets: 201 10*3/uL (ref 150–400)
RBC: 4.02 MIL/uL (ref 3.87–5.11)
RDW: 12.2 % (ref 11.5–15.5)
WBC: 8.2 10*3/uL (ref 4.0–10.5)
nRBC: 0 % (ref 0.0–0.2)

## 2021-06-09 LAB — BASIC METABOLIC PANEL
Anion gap: 6 (ref 5–15)
BUN: 45 mg/dL — ABNORMAL HIGH (ref 8–23)
CO2: 24 mmol/L (ref 22–32)
Calcium: 8.5 mg/dL — ABNORMAL LOW (ref 8.9–10.3)
Chloride: 115 mmol/L — ABNORMAL HIGH (ref 98–111)
Creatinine, Ser: 0.99 mg/dL (ref 0.44–1.00)
GFR, Estimated: 58 mL/min — ABNORMAL LOW (ref >60)
Glucose, Bld: 106 mg/dL — ABNORMAL HIGH (ref 70–99)
Potassium: 3.5 mmol/L (ref 3.5–5.1)
Sodium: 145 mmol/L (ref 135–145)

## 2021-06-09 LAB — URINE CULTURE: Culture: >=100000 — AB

## 2021-06-09 LAB — MAGNESIUM: Magnesium: 2.7 mg/dL — ABNORMAL HIGH (ref 1.7–2.4)

## 2021-06-09 LAB — PHOSPHORUS: Phosphorus: 2.8 mg/dL (ref 2.5–4.6)

## 2021-06-09 NOTE — TOC Progression Note (Signed)
Transition of Care [facility]) - Progression Note    Patient Details  Name: [name] MRN: [id number] Date of Birth: 05/24/1940  [facility] CM/SW Contact  Golda Acre, RN Phone Number: 06/09/2021, 1:58 PM  Clinical Narrative:    Baron Hamper Precious Haws left on v.mail to return call to see if she can return with alf or memory care and physical therapy.   Expected Discharge Plan: Home/Self Care Barriers to Discharge: Continued Medical Work up  Expected Discharge Plan and Services Expected Discharge Plan: Home/Self Care   Discharge Planning Services: CM Consult   Living arrangements for the past 2 months: Single Family Home                                       Social Determinants of Health (SDOH) Interventions    Readmission Risk Interventions No flowsheet data found.

## 2021-06-09 NOTE — Evaluation (Signed)
Occupational Therapy Evaluation Patient Details Name: [name] MRN: [id number] DOB: March 11, 1941 Today's Date: 06/09/2021   History of Present Illness Patient is a 81 y.o. female presented to who is coming to the emergency department from [facility] nursing facility due to AMS.  Work-up revealed severe hypernatremia, dehydration, AKI and UTI. PMH significant of Alzheimer's disease, osteoarthritis, depression, cervical spine surgery, hypothyroidism, hyperthyroidism, CAD, history of MI, osteoporosis, vitamin B12 deficiency.   Clinical Impression   [name] is a 81 year old woman who presents to hospital with above medical history. She is a from a local facility and typically has assistance to transfer and requires assistance for all ADLs. On evaluation patient is unable to follow one command. She exhibits limited ability to attend to therapist and has a tendency to look away to the right. With attempts at oral care she bites the tooth brush. With attemtp to wash face she does not bring hand to face nor does she initiate with hand over hand. Patient required +2 assistance to stand and pivot to bed. She was total care to clean up after small BM. Her verbalizations are minimal and very hard to understand but does sound like she is asking for her mother. Patient at ADL baseline and does not require OT services.      Recommendations for follow up therapy are one component of a multi-disciplinary discharge planning process, led by the attending physician.  Recommendations may be updated based on patient status, additional functional criteria and insurance authorization.   Follow Up Recommendations  Long-term institutional care without follow-up therapy    Assistance Recommended at Discharge    Patient can return home with the following A lot of help with walking and/or transfers;A lot of help with bathing/dressing/bathroom;Assistance with feeding    Functional Status Assessment   Patient has not had a recent decline in their functional status  Equipment Recommendations  None recommended by OT    Recommendations for Other Services       Precautions / Restrictions Precautions Precautions: Fall Precaution Comments: pt's husband reports she has fallen several times in last year Restrictions Weight Bearing Restrictions: No      Mobility Bed Mobility Overal bed mobility: Needs Assistance Bed Mobility: Sit to Supine       Sit to supine: Total assist, +2 for physical assistance        Transfers Overall transfer level: Needs assistance Equipment used: 2 person hand held assist Transfers: Sit to/from Stand, Bed to chair/wheelchair/BSC Sit to Stand: Total assist, +2 physical assistance Stand pivot transfers: Total assist, +2 physical assistance                Balance Overall balance assessment: Needs assistance Sitting-balance support: No upper extremity supported Sitting balance-Leahy Scale: Poor       Standing balance-Leahy Scale: Zero                             ADL either performed or assessed with clinical judgement   ADL Overall ADL's : At baseline                                       General ADL Comments: Patient required total care for ADLs - require total care for ADLs at facility.     Vision   Vision Assessment?: No apparent visual deficits  Perception     Praxis      Pertinent Vitals/Pain Pain Assessment Pain Assessment: Faces Faces Pain Scale: Hurts a little bit Pain Location: periarea - with wiping Pain Intervention(s): Limited activity within patient's tolerance     Hand Dominance     Extremity/Trunk Assessment Upper Extremity Assessment Upper Extremity Assessment: Difficult to assess due to impaired cognition   Lower Extremity Assessment Lower Extremity Assessment: Defer to PT evaluation   Cervical / Trunk Assessment Cervical / Trunk Assessment: Normal   Communication  Communication Communication:  (cognition is limiting)   Cognition Arousal/Alertness: Awake/alert Behavior During Therapy: Flat affect Overall Cognitive Status: History of cognitive impairments - at baseline                                 General Comments: hx of Alzheimer's dementia, unable to follow commands, nonverbal     General Comments       Exercises     Shoulder Instructions      Home Living Family/patient expects to be discharged to:: Skilled nursing facility                                 Additional Comments: Pt from [facility] memory care facility and has been there 3 years. PTA pt's husband reports she has been mobilizing with RN staff for transfers and gait with no AD. she needs help (see PLOF) with mobility and ADL's and has been declining in the last few weeks with increased weakness.      Prior Functioning/Environment Prior Level of Function : Needs assist  Cognitive Assist : Mobility (cognitive);ADLs (cognitive) Mobility (Cognitive): Step by step cues ADLs (Cognitive): Step by step cues Physical Assist : Mobility (physical);ADLs (physical) Mobility (physical): Bed mobility;Transfers;Gait ADLs (physical): Feeding;Grooming;Bathing;Dressing;IADLs;Toileting Mobility Comments: Pt requires assist from staff to get in/out of bed and rise from sitting to stand. Staff was helping her walk to/from bathroom and to dining hall. ADLs Comments: Pt needs Total Assist for ADL's including feeding.        OT Problem List: Decreased cognition;Impaired balance (sitting and/or standing);Decreased activity tolerance;Decreased safety awareness      OT Treatment/Interventions:      OT Goals(Current goals can be found in the care plan section) Acute Rehab OT Goals OT Goal Formulation: Patient unable to participate in goal setting  OT Frequency:      Co-evaluation              AM-PAC OT "6 Clicks" Daily Activity     Outcome Measure Help  from another person eating meals?: Total Help from another person taking care of personal grooming?: Total Help from another person toileting, which includes using toliet, bedpan, or urinal?: Total Help from another person bathing (including washing, rinsing, drying)?: Total Help from another person to put on and taking off regular upper body clothing?: Total Help from another person to put on and taking off regular lower body clothing?: Total 6 Click Score: 6   End of Session Nurse Communication: Mobility status  Activity Tolerance: Patient limited by lethargy Patient left: in bed;with call bell/phone within reach;with bed alarm set  OT Visit Diagnosis: Muscle weakness (generalized) (M62.81)                Time: 5631-4970 OT Time Calculation (min): 17 min Charges:  OT General Charges $OT Visit: 1 Visit OT Evaluation $  OT Eval Low Complexity: 1 Low  [name], OTR/L Acute Care Rehab Services  Office [phone] Pager: [phone]   [name] 06/09/2021, 12:07 PM

## 2021-06-09 NOTE — Progress Notes (Signed)
PROGRESS NOTE    [name]  [id number] DOB: 12/17/1940 DOA: 06/07/2021 PCP: Patient, No Pcp Per (Inactive)    Brief Narrative:  [name] is a 81 year old female with past medical history significant for Alzheimer's dementia, osteoarthritis, depression, hypothyroidism, CAD, history of MI, osteoporosis, vitamin B12 deficiency who presented to [facility] ED on 1/16 from [facility] memory care facility with confusion and altered mental status.  Also with reported fall night prior per facility.  Given patient's advanced dementia, unable to dissipate in HPI.  In the ED, temperature 97.7 F, HR 73, RR 16, BP 130/95, SPO2 95% on room air.  Sodium 163, potassium 4.2, chloride 130, CO2 26, glucose 123, BUN 76, creatinine 1.51.  AST 30, ALT 54, total bilirubin 0.9.  WBC 11.7, hemoglobin 14.7, platelets 272.  Urinalysis with large leukocytes, positive nitrite, many bacteria, greater than 50 WBCs.  CT head without contrast limited study but with no definite acute intracranial abnormality, stable severe atrophy and moderate chronic microvascular ischemic changes.  CT C-spine with no acute cervical spine fracture.  CT abdominal/pelvis with expansion of the rectal vault by large stool ball, moderate volume stool left and right colon consistent with constipation, no evidence of bowel disruption.  Chest x-ray with no acute findings.  Right hip with no fracture or dislocation, probable fecal impaction with constipation.  EDP consulted [facility] for further evaluation management of hypernatremia, dehydration, AKI, UTI and constipation   Assessment & Plan:   Principal Problem:   Hypernatremia Active Problems:   Alzheimer's disease ([place])   Hyperthyroidism   AKI (acute kidney injury) ([place])   Dehydration   Acute lower UTI   Acute metabolic encephalopathy, POA: Resolved Patient presenting to the ED with increased confusion from her typical baseline and was found to have elevated sodium level likely  secondary to severe dehydration.  Additionally was found to have a urinary tract infection which is likely contributing factor.  CT head without contrast with no acute intracranial abnormality.  With IV fluid hydration, patient's sodium level now normalized and per family patient appears back to her normal mental status. --Continue supportive measures, treatment as below  Hyponatremia: Resolved Patient presenting with a sodium of 163, likely secondary to severe dehydration. --Na 163>>150>145 --Continue D5W at 50 mL/h --Encourage increase oral intake --Repeat BMP in a.m.  Acute renal failure Patient presenting with a creatinine of 1.51, baseline creatinine 0.8.  Etiology likely secondary to prerenal azotemia in the setting of severe dehydration. --Cr 1.51>>0.99 --Continue IV fluid hydration as above --Encourage increase oral intake --Repeat BMP in a.m.  Proteus mirabilis UTI Urinalysis on admission with large leukocytes, positive nitrite, many bacteria and greater than 50 WBCs.  Urine culture positive for Proteus. --Continue ceftriaxone 1 g IV every 24 hours x3 days  Constipation/fecal impaction: Resolved --3 bowel movement reported past 24 hours --Continue monitor closely  Alzheimer's dementia --Delirium precautions --Get up during the day --Encourage a familiar face to remain present throughout the day --Keep blinds open and lights on during daylight hours --Minimize the use of opioids/benzodiazepines  Weakness/debility/deconditioning: --PT recommending SNF, OT recommending long-term care --TOC for evaluation for SNF placement, from [facility] memory care unit    DVT prophylaxis: enoxaparin (LOVENOX) injection 40 mg Start: 06/08/21 2200   Code Status: Full Code Family Communication: Updated family present at bedside this morning  Disposition Plan:  Level of care: Progressive Status is: Inpatient  Remains inpatient appropriate because: Continues with IV fluid  hydration, pending SNF placement, from [facility] memory care unit     Consultants:  None  Procedures:  None  Antimicrobials:  Ceftriaxone 1/16 - 1/18   Subjective: Patient seen and examined at bedside, resting calmly.  Pleasantly confused.  PT working with the patient.  Family present.  Family states her mental status appears to be at her normal baseline now.  Remains very weak and debilitated, at baseline able to ambulate without assistance.  Will likely need SNF placement.  Updated POC.  No other questions or concerns at this time.  No acute concerns overnight per nurse staff.  Objective: Vitals:   06/08/21 0618 06/08/21 2000 06/09/21 0526 06/09/21 1315  BP: (!) 146/97 90/66 126/81 (!) 121/57  Pulse: 83 79 62 62  Resp: 20 20 18 15   Temp: 98 F (36.7 C) 98.4 F (36.9 C) 98 F (36.7 C) 97.7 F (36.5 C)  TempSrc: Oral Oral  Oral  SpO2: 97% 96% 99% (!) 79%  Weight:      Height:        Intake/Output Summary (Last 24 hours) at 06/09/2021 1523 Last data filed at 06/09/2021 0000 Gross per 24 hour  Intake 449.97 ml  Output --  Net 449.97 ml   Filed Weights   06/07/21 0816  Weight: 47.6 kg    Examination:  General exam: Appears calm and comfortable, pleasantly confused, thin in appearance Respiratory system: Clear to auscultation. Respiratory effort normal.  Room air Cardiovascular system: S1 & S2 heard, RRR. No JVD, murmurs, rubs, gallops or clicks. No pedal edema. Gastrointestinal system: Abdomen is nondistended, soft and nontender. No organomegaly or masses felt. Normal bowel sounds heard. Central nervous system: Alert, but not oriented to person/place/time/situation. No focal neurological deficits. Extremities: Moves all extremities independently, strength preserved bilaterally Skin: No rashes, lesions or ulcers Psychiatry: Judgement and insight appear poor. Mood & affect appropriate.     Data Reviewed: I have personally reviewed following labs and imaging  studies  CBC: Recent Labs  Lab 06/07/21 1008 06/08/21 0047 06/09/21 0342  WBC 11.7* 12.0* 8.2  NEUTROABS 8.7*  --   --   HGB 14.7 13.4 12.6  HCT 47.6* 43.2 39.0  MCV 100.0 99.8 97.0  PLT 272 [id number]   Basic Metabolic Panel: Recent Labs  Lab 06/07/21 1008 06/07/21 1300 06/07/21 1911 06/07/21 2207 06/08/21 0047 06/08/21 1717 06/09/21 0342  NA 163*   < > 155* 155* 153* 150* 145  K 4.2  --   --   --  3.6 3.7 3.5  CL 130*  --   --   --  126* 118* 115*  CO2 26  --   --   --  21* 23 24  GLUCOSE 123*  --   --   --  104* 118* 106*  BUN 76*  --   --   --  59* 48* 45*  CREATININE 1.51*  --   --   --  1.12* 1.13* 0.99  CALCIUM 9.3  --   --   --  8.6* 8.4* 8.5*  MG  --   --   --   --   --   --  2.7*  PHOS  --   --   --   --   --   --  2.8   < > = values in this interval not displayed.   GFR: Estimated Creatinine Clearance: 35.2 mL/min (by C-G formula based on SCr of 0.99 mg/dL). Liver Function Tests: Recent Labs  Lab 06/07/21 1008 06/08/21 0047  AST 30 27  ALT 54* 44  ALKPHOS 116 106  BILITOT 0.9 0.8  PROT 7.7 6.6  ALBUMIN 4.5 3.8   No results for input(s): LIPASE, AMYLASE in the last 168 hours. No results for input(s): AMMONIA in the last 168 hours. Coagulation Profile: No results for input(s): INR, PROTIME in the last 168 hours. Cardiac Enzymes: No results for input(s): CKTOTAL, CKMB, CKMBINDEX, TROPONINI in the last 168 hours. BNP (last 3 results) No results for input(s): PROBNP in the last 8760 hours. HbA1C: No results for input(s): HGBA1C in the last 72 hours. CBG: No results for input(s): GLUCAP in the last 168 hours. Lipid Profile: No results for input(s): CHOL, HDL, LDLCALC, TRIG, CHOLHDL, LDLDIRECT in the last 72 hours. Thyroid Function Tests: No results for input(s): TSH, T4TOTAL, FREET4, T3FREE, THYROIDAB in the last 72 hours. Anemia Panel: No results for input(s): VITAMINB12, FOLATE, FERRITIN, TIBC, IRON, RETICCTPCT in the last 72 hours. Sepsis  Labs: No results for input(s): PROCALCITON, LATICACIDVEN in the last 168 hours.  Recent Results (from the past 240 hour(s))  Urine Culture     Status: Abnormal   Collection Time: 06/07/21  1:42 PM   Specimen: Urine, Catheterized  Result Value Ref Range Status   Specimen Description   Final    URINE, CATHETERIZED Performed at [facility] [address], [place], [place] [address]    Special Requests   Final    NONE Performed at [facility], [place] [address], [place], [place] [address]    Culture >=100,000 COLONIES/mL PROTEUS MIRABILIS (A)  Final   Report Status 06/09/2021 FINAL  Final   Organism ID, Bacteria PROTEUS MIRABILIS (A)  Final      Susceptibility   Proteus mirabilis - MIC*    AMPICILLIN <=2 SENSITIVE Sensitive     CEFAZOLIN <=4 SENSITIVE Sensitive     CEFEPIME <=0.12 SENSITIVE Sensitive     CEFTRIAXONE <=0.25 SENSITIVE Sensitive     CIPROFLOXACIN <=0.25 SENSITIVE Sensitive     GENTAMICIN <=1 SENSITIVE Sensitive     IMIPENEM 2 SENSITIVE Sensitive     NITROFURANTOIN 128 RESISTANT Resistant     TRIMETH/SULFA <=20 SENSITIVE Sensitive     AMPICILLIN/SULBACTAM <=2 SENSITIVE Sensitive     PIP/TAZO <=4 SENSITIVE Sensitive     * >=100,000 COLONIES/mL PROTEUS MIRABILIS  Resp Panel by RT-PCR (Flu A&B, Covid) Nasopharyngeal Swab     Status: None   Collection Time: 06/07/21  4:56 PM   Specimen: Nasopharyngeal Swab; Nasopharyngeal(NP) swabs in vial transport medium  Result Value Ref Range Status   SARS Coronavirus 2 by RT PCR NEGATIVE NEGATIVE Final    Comment: (NOTE) SARS-CoV-2 target nucleic acids are NOT DETECTED.  The SARS-CoV-2 RNA is generally detectable in upper respiratory specimens during the acute phase of infection. The lowest concentration of SARS-CoV-2 viral copies this assay can detect is 138 copies/mL. A negative result does not preclude SARS-Cov-2 infection and should not be used as the sole basis for treatment or other  patient management decisions. A negative result may occur with  improper specimen collection/handling, submission of specimen other than nasopharyngeal swab, presence of viral mutation(s) within the areas targeted by this assay, and inadequate number of viral copies(<138 copies/mL). A negative result must be combined with clinical observations, patient history, and epidemiological information. The expected result is Negative.  Fact Sheet for Patients:  EntrepreneurPulse.com.au  Fact Sheet for Healthcare Providers:  IncredibleEmployment.be  This test is no t yet approved or cleared by the [place]  States FDA and  has been authorized for detection and/or diagnosis of SARS-CoV-2 by FDA under an Emergency Use Authorization (EUA). This EUA will remain  in effect (meaning this test can be used) for the duration of the COVID-19 declaration under Section 564(b)(1) of the Act, 21 U.S.C.section 360bbb-3(b)(1), unless the authorization is terminated  or revoked sooner.       Influenza A by PCR NEGATIVE NEGATIVE Final   Influenza B by PCR NEGATIVE NEGATIVE Final    Comment: (NOTE) The Xpert Xpress SARS-CoV-2/FLU/RSV plus assay is intended as an aid in the diagnosis of influenza from Nasopharyngeal swab specimens and should not be used as a sole basis for treatment. Nasal washings and aspirates are unacceptable for Xpert Xpress SARS-CoV-2/FLU/RSV testing.  Fact Sheet for Patients: EntrepreneurPulse.com.au  Fact Sheet for Healthcare Providers: IncredibleEmployment.be  This test is not yet approved or cleared by the [place] FDA and has been authorized for detection and/or diagnosis of SARS-CoV-2 by FDA under an Emergency Use Authorization (EUA). This EUA will remain in effect (meaning this test can be used) for the duration of the COVID-19 declaration under Section 564(b)(1) of the Act, 21 U.S.C. section  360bbb-3(b)(1), unless the authorization is terminated or revoked.  Performed at [facility], [place] [address], [place],  [address]          Radiology Studies: No results found.      Scheduled Meds:  enoxaparin (LOVENOX) injection  40 mg Subcutaneous Q24H   Continuous Infusions:  dextrose 50 mL/hr at 06/09/21 0325     LOS: 1 day    Time spent: 42 minutes spent on chart review, discussion with nursing staff, consultants, updating family and interview/physical exam; more than 50% of that time was spent in counseling and/or coordination of care.    Keshonna Valvo J [place] DO [facility] Available via Epic secure chat 7am-7pm After these hours, please refer to coverage provider listed on [email or web address] 06/09/2021, 3:23 PM

## 2021-06-09 NOTE — Evaluation (Signed)
Physical Therapy Evaluation Patient Details Name: [name] MRN: [id number] DOB: [birth year] Today's Date: 06/09/2021  History of Present Illness  Patient is a 81 y.o. female presented to who is coming to the emergency department from [facility] nursing facility due to AMS.  Work-up revealed severe hypernatremia, dehydration, AKI and UTI. PMH significant of Alzheimer's disease, osteoarthritis, depression, cervical spine surgery, hypothyroidism, hyperthyroidism, CAD, history of MI, osteoporosis, vitamin B12 deficiency.    Clinical Impression  [name] is 81 y.o. female admitted with above HPI and diagnosis. Patient is currently limited by functional impairments below (see PT problem list). [facility] at [facility] and per pt's husband was mobilizing at facility without device with min assist at baseline. Patient does have assist from staff for transfers and ADL's. Patient will benefit from continued skilled PT interventions to address impairments and progress independence with mobility, recommending return to memory care facility and continue working with PT there to regain independence with mobility. Acute PT will follow and progress as able.       Recommendations for follow up therapy are one component of a multi-disciplinary discharge planning process, led by the attending physician.  Recommendations may be updated based on patient status, additional functional criteria and insurance authorization.  Follow Up Recommendations Skilled nursing-short term rehab (<3 hours/day) ([facility])    Assistance Recommended at Discharge Frequent or constant Supervision/Assistance  Patient can return home with the following  Two people to help with walking and/or transfers;Two people to help with bathing/dressing/bathroom;Assistance with feeding;Assistance with cooking/housework;Direct supervision/assist for medications management;Direct  supervision/assist for financial management;Assist for transportation;Help with stairs or ramp for entrance    Equipment Recommendations None recommended by PT (TBA)  Recommendations for Other Services       Functional Status Assessment Patient has had a recent decline in their functional status and/or demonstrates limited ability to make significant improvements in function in a reasonable and predictable amount of time     Precautions / Restrictions Precautions Precautions: Fall Precaution Comments: pt's husband reports she has fallen several times in last year Restrictions Weight Bearing Restrictions: No      Mobility  Bed Mobility Overal bed mobility: Needs Assistance Bed Mobility: Supine to Sit     Supine to sit: Mod assist, Max assist, HOB elevated     General bed mobility comments: pt required HOB elevated to improve alertness. Max assist with use of bed pad to pivot to EOB.    Transfers Overall transfer level: Needs assistance Equipment used: 1 person hand held assist (bear hug) Transfers: Sit to/from Stand, Bed to chair/wheelchair/BSC Sit to Stand: Max assist, +2 safety/equipment Stand pivot transfers: Max assist, +2 safety/equipment         General transfer comment: Max/Total Assist to "bear hug" into full stand. pt unable to hold standing balance. Max to pivot to recliner and pt able to take two small steps forward with Max assist to maintain balance.    Ambulation/Gait                  Stairs            Wheelchair Mobility    Modified Rankin (Stroke Patients Only)       Balance Overall balance assessment: Needs assistance Sitting-balance support: Feet supported, Bilateral upper extremity supported Sitting balance-Leahy Scale: Fair       Standing balance-Leahy Scale: Zero Standing balance comment: reliant on Max/Total assist for standing balance  Pertinent Vitals/Pain Pain Assessment Pain  Assessment: PAINAD Breathing: normal Negative Vocalization: none Facial Expression: smiling or inexpressive Body Language: relaxed Consolability: no need to console PAINAD Score: 0 Pain Intervention(s): Monitored during session, Repositioned, Limited activity within patient's tolerance    Home Living Family/patient expects to be discharged to:: Skilled nursing facility                   Additional Comments: Pt from [facility] memory care facility and has been there 3 years. PTA pt's husband reports she has been mobilizing with RN staff for transfers and gait with no AD. she needs help (see PLOF) with mobility and ADL's and has been declining in the last few weeks with increased weakness.    Prior Function Prior Level of Function : Needs assist  Cognitive Assist : Mobility (cognitive);ADLs (cognitive) Mobility (Cognitive): Step by step cues ADLs (Cognitive): Step by step cues Physical Assist : Mobility (physical);ADLs (physical) Mobility (physical): Bed mobility;Transfers;Gait ADLs (physical): Feeding;Grooming;Bathing;Dressing;IADLs;Toileting Mobility Comments: Pt requires assist from staff to get in/out of bed and rise from sitting to stand. Staff was helping her walk to/from bathroom and to dining hall. ADLs Comments: Pt needs Total Assist for ADL's including feeding.     Hand Dominance        Extremity/Trunk Assessment   Upper Extremity Assessment Upper Extremity Assessment: Defer to OT evaluation;Generalized weakness    Lower Extremity Assessment Lower Extremity Assessment: Generalized weakness    Cervical / Trunk Assessment Cervical / Trunk Assessment: Normal  Communication   Communication:  (cognition is limiting)  Cognition Arousal/Alertness: Awake/alert Behavior During Therapy: Flat affect Overall Cognitive Status: History of cognitive impairments - at baseline                                 General Comments: hx of Alzheimer's  dementia        General Comments      Exercises     Assessment/Plan    PT Assessment Patient needs continued PT services  PT Problem List Decreased strength;Decreased activity tolerance;Decreased balance;Decreased mobility;Decreased cognition;Decreased knowledge of use of DME;Decreased safety awareness;Decreased knowledge of precautions       PT Treatment Interventions DME instruction;Gait training;Functional mobility training;Therapeutic activities;Therapeutic exercise;Balance training;Neuromuscular re-education;Cognitive remediation;Patient/family education    PT Goals (Current goals can be found in the Care Plan section)  Acute Rehab PT Goals Patient Stated Goal: get walking again PT Goal Formulation: With family Time For Goal Achievement: 06/23/21 Potential to Achieve Goals: Fair    Frequency Min 2X/week     Co-evaluation               AM-PAC PT "6 Clicks" Mobility  Outcome Measure Help needed turning from your back to your side while in a flat bed without using bedrails?: A Lot Help needed moving from lying on your back to sitting on the side of a flat bed without using bedrails?: A Lot Help needed moving to and from a bed to a chair (including a wheelchair)?: Total Help needed standing up from a chair using your arms (e.g., wheelchair or bedside chair)?: Total Help needed to walk in hospital room?: Total Help needed climbing 3-5 steps with a railing? : Total 6 Click Score: 8    End of Session Equipment Utilized During Treatment: Gait belt Activity Tolerance: Patient tolerated treatment well Patient left: in chair;with call bell/phone within reach;with chair alarm set;with family/visitor present Nurse Communication: Mobility  status PT Visit Diagnosis: Unsteadiness on feet (R26.81);Other abnormalities of gait and mobility (R26.89);Muscle weakness (generalized) (M62.81);Difficulty in walking, not elsewhere classified (R26.2)    Time: 4128-7867 PT Time  Calculation (min) (ACUTE ONLY): 35 min   Charges:   PT Evaluation $PT Eval Moderate Complexity: 1 Mod PT Treatments $Therapeutic Activity: 8-22 mins        Wynn Maudlin, DPT Acute Rehabilitation Services Office [phone] Pager [phone]   Anitra Lauth 06/09/2021, 11:04 AM

## 2021-06-09 NOTE — Progress Notes (Signed)
SLP Cancellation Note  Patient Details Name: [name] MRN: [id number] DOB: 07/25/1940   Cancelled treatment:       Reason Eval/Treat Not Completed: Other (comment) (pt asleep and spouse left for appointment, will continue efforts)  Kathleen Lime, MS [facility] SLP [place] Office [phone] Cell [phone]  Macario Golds 06/09/2021, 12:05 PM

## 2021-06-10 LAB — BASIC METABOLIC PANEL
Anion gap: 10 (ref 5–15)
BUN: 30 mg/dL — ABNORMAL HIGH (ref 8–23)
CO2: 21 mmol/L — ABNORMAL LOW (ref 22–32)
Calcium: 8.5 mg/dL — ABNORMAL LOW (ref 8.9–10.3)
Chloride: 111 mmol/L (ref 98–111)
Creatinine, Ser: 0.75 mg/dL (ref 0.44–1.00)
GFR, Estimated: >60 mL/min (ref >60)
Glucose, Bld: 99 mg/dL (ref 70–99)
Potassium: 3.6 mmol/L (ref 3.5–5.1)
Sodium: 142 mmol/L (ref 135–145)

## 2021-06-10 LAB — MAGNESIUM: Magnesium: 2.6 mg/dL — ABNORMAL HIGH (ref 1.7–2.4)

## 2021-06-10 NOTE — TOC Transition Note (Signed)
Transition of Care [facility]) - CM/SW Discharge Note   Patient Details  Name: [name] MRN: [id number] Date of Birth: 09-Feb-1941  Transition of Care [facility]) CM/SW Contact:  Golda Acre, RN Phone Number: 06/10/2021, 2:02 PM   Clinical Narrative:    Per crystal patient may return to [facility]/ ptar called at 220pm /transport packet prepared and given to unit clerk/     Barriers to Discharge: Continued Medical Work up   Patient Goals and CMS Choice Patient states their goals for this hospitalization and ongoing recovery are:: to go home CMS Medicare.gov Compare Post Acute Care list provided to:: Patient    Discharge Placement                       Discharge Plan and Services   Discharge Planning Services: CM Consult                                 Social Determinants of Health (SDOH) Interventions     Readmission Risk Interventions No flowsheet data found.

## 2021-06-10 NOTE — Progress Notes (Signed)
Report called to [facility] from [place], [phone]. P-TAR dispatched. Family aware of  return to facility. SRP,RN

## 2021-06-10 NOTE — Plan of Care (Signed)

## 2021-06-10 NOTE — NC FL2 (Signed)
°  [place] LEVEL OF CARE SCREENING TOOL     IDENTIFICATION  Patient Name: [name] Birthdate: Aug 16, 1940 Sex: female Admission Date (Current Location): 06/07/2021  [facility] and [place] Number:  Herbalist and Address:  [facility],  [place], [place]      Provider Number: [id number]  Attending Physician Name and Address:  [place] Donnamarie Poag, DO  Relative Name and Phone Number:       Current Level of Care: Hospital Recommended Level of Care: Memory Care Prior Approval Number:    Date Approved/Denied:   PASRR Number: [id number]  Discharge Plan: Other (Comment) (alf)    Current Diagnoses: Patient Active Problem List   Diagnosis Date Noted   Hypernatremia 06/07/2021   AKI (acute kidney injury) ([place]) 06/07/2021   Dehydration 06/07/2021   Acute lower UTI 06/07/2021   Pure hypercholesterolemia 05/14/2018   Hyperthyroidism 03/29/2018   Weight loss 03/29/2018   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   Alzheimer's disease ([place]) 12/31/2012   Other vitamin B12 deficiency anemia 12/31/2012    Orientation RESPIRATION BLADDER Height & Weight        Normal Continent Weight: 47.6 kg Height:  5\' 1"  (154.9 cm)  BEHAVIORAL SYMPTOMS/MOOD NEUROLOGICAL BOWEL NUTRITION STATUS      Continent Diet (regular)  AMBULATORY STATUS COMMUNICATION OF NEEDS Skin   Extensive Assist Verbally Normal                       Personal Care Assistance Level of Assistance  Bathing, Feeding, Dressing Bathing Assistance: Maximum assistance Feeding assistance: Maximum assistance Dressing Assistance: Maximum assistance     Functional Limitations Info  Sight, Hearing, Speech Sight Info: Adequate Hearing Info: Adequate Speech Info: Adequate    SPECIAL CARE FACTORS FREQUENCY  PT (By licensed PT), OT (By licensed OT)     PT Frequency: 5 x weekly OT Frequency: 5 x weekly            Contractures Contractures Info: Not present     Additional Factors Info  Code Status Code Status Info: full             Current Medications (06/10/2021):  This is the current hospital active medication list Current Facility-Administered Medications  Medication Dose Route Frequency Provider Last Rate Last Admin   acetaminophen (TYLENOL) tablet 650 mg  650 mg Oral Q6H PRN Reubin Milan, MD   650 mg at 06/10/21 1005   Or   acetaminophen (TYLENOL) suppository 650 mg  650 mg Rectal Q6H PRN Reubin Milan, MD   650 mg at 06/08/21 1033   enoxaparin (LOVENOX) injection 40 mg  40 mg Subcutaneous Q24H Poindexter, Leann T, RPH   40 mg at 06/09/21 2118   ondansetron (ZOFRAN) tablet 4 mg  4 mg Oral Q6H PRN Reubin Milan, MD       Or   ondansetron [facility]) injection 4 mg  4 mg Intravenous Q6H PRN Reubin Milan, MD         Discharge Medications: Please see discharge summary for a list of discharge medications.  Relevant Imaging Results:  Relevant Lab Results:   Additional Information [id number]  Leeroy Cha, RN

## 2021-06-10 NOTE — Discharge Summary (Signed)
Physician Discharge Summary  [name] [id number] DOB: [birth year] DOA: 06/07/2021  PCP: Patient, No Pcp Per (Inactive)  Admit date: 06/07/2021 Discharge date: 06/10/2021  Admitted From: [facility] Disposition: [facility]  Recommendations for Outpatient Follow-up:  Follow up with PCP in 1-2 weeks Encourage increased oral intake, presenting symptoms likely related to severe dehydration Continue further goals of care discussion, would recommend outpatient palliative care follow-up given her advanced dementia Please obtain BMP in one week to reassess sodium and creatinine level  Home Health: PT/OT Equipment/Devices: None  Discharge Condition: Stable CODE STATUS: Full code Diet recommendation: Regular diet  History of present illness:  [name] is a 81 year old female with past medical history significant for Alzheimer's dementia, osteoarthritis, depression, hypothyroidism, CAD, history of MI, osteoporosis, vitamin B12 deficiency who presented to [facility] ED on 1/16 from [facility] memory care facility with confusion and altered mental status.  Also with reported fall night prior per facility.  Given patient's advanced dementia, unable to participate in interview.   In the ED, temperature 97.7 F, HR 73, RR 16, BP 130/95, SPO2 95% on room air.  Sodium 163, potassium 4.2, chloride 130, CO2 26, glucose 123, BUN 76, creatinine 1.51.  AST 30, ALT 54, total bilirubin 0.9.  WBC 11.7, hemoglobin 14.7, platelets 272.  Urinalysis with large leukocytes, positive nitrite, many bacteria, greater than 50 WBCs.  CT head without contrast limited study but with no definite acute intracranial abnormality, stable severe atrophy and moderate chronic microvascular ischemic changes.  CT C-spine with no acute cervical spine fracture.  CT abdominal/pelvis with expansion of the rectal vault by large stool ball, moderate volume stool left and right colon consistent with constipation, no  evidence of bowel disruption.  Chest x-ray with no acute findings.  Right hip with no fracture or dislocation, probable fecal impaction with constipation.  EDP consulted [facility] for further evaluation management of hypernatremia, dehydration, AKI, UTI and constipation  Hospital course:  Acute metabolic encephalopathy, POA: Resolved Patient presenting to the ED with increased confusion from her typical baseline and was found to have elevated sodium level likely secondary to severe dehydration.  Additionally was found to have a urinary tract infection which is likely contributing factor.  CT head without contrast with no acute intracranial abnormality.  With IV fluid hydration, patient's sodium level now normalized and per family patient appears back to her normal mental status.  Hyponatremia: Resolved Patient presenting with a sodium of 163, likely secondary to severe dehydration.  Patient was supported with IV fluid hydration with D5W with improvement of sodium to 142 at time of discharge.  Continue to encourage increased fluid/oral intake.  Recommend repeat BMP 1 week.   Acute renal failure: Resolved Patient presenting with a creatinine of 1.51, baseline creatinine 0.8.  Etiology likely secondary to prerenal azotemia in the setting of severe dehydration.  Patient was started on IV fluid hydration with improvement of creatinine to 0.75 at time of discharge.  Continue to encourage increased fluid intake.  Recommend repeat BMP 1 week.   Proteus mirabilis UTI Urinalysis on admission with large leukocytes, positive nitrite, many bacteria and greater than 50 WBCs.  Urine culture positive for Proteus.  Completed 3-day course of IV ceftriaxone.   Constipation/fecal impaction: Resolved Continue to monitor bowel movements, recommend 1 bowel movement per day.  Laxatives as needed.   Alzheimer's dementia Minimize the use of opioids/benzodiazepines.  Recommend further goals of care discussions with family as  Alzheimer's dementia is a terminal  disease with overall poor prognosis and would recommend outpatient health care to follow.   Weakness/debility/deconditioning: Discharging back to [facility] memory care unit with PT/OT.  Discharge Diagnoses:  Active Problems:   Alzheimer's disease [facility])   Hyperthyroidism    Discharge Instructions  Discharge Instructions     Call MD for:  difficulty breathing, headache or visual disturbances   Complete by: As directed    Call MD for:  extreme fatigue   Complete by: As directed    Call MD for:  persistant dizziness or light-headedness   Complete by: As directed    Call MD for:  persistant nausea and vomiting   Complete by: As directed    Call MD for:  severe uncontrolled pain   Complete by: As directed    Call MD for:  temperature >100.4   Complete by: As directed    Diet - low sodium heart healthy   Complete by: As directed    Increase activity slowly   Complete by: As directed       Allergies as of 06/10/2021       Reactions   Asa [aspirin] Nausea And Vomiting, Other (See Comments)   Bleeding ulcers        Medication List     TAKE these medications    acetaminophen 500 MG tablet Commonly known as: TYLENOL Take 500 mg by mouth 2 (two) times daily.   loratadine 10 MG tablet Commonly known as: CLARITIN Take 10 mg by mouth daily.   melatonin 3 MG Tabs tablet Take 3 mg by mouth at bedtime.   methimazole 5 MG tablet Commonly known as: TAPAZOLE Take 5 mg by mouth 2 (two) times a week. 5mg  daily on Monday, Thursday   NON FORMULARY Apply 1 mL topically daily. Lorazepam PLO gel - Apply 1 ml (0.5 mg) topically daily   OVER THE COUNTER MEDICATION Take 1 Container by mouth 3 (three) times daily. Supplemental shake        Allergies  Allergen Reactions   Asa [Aspirin] Nausea And Vomiting and Other (See Comments)    Bleeding ulcers    Consultations: None   Procedures/Studies: CT ABDOMEN PELVIS WO  CONTRAST  Result Date: 06/07/2021 CLINICAL DATA:  Bowel obstruction suspected EXAM: CT ABDOMEN AND PELVIS WITHOUT CONTRAST TECHNIQUE: Multidetector CT imaging of the abdomen and pelvis was performed following the standard protocol without IV contrast. RADIATION DOSE REDUCTION: This exam was performed according to the departmental dose-optimization program which includes automated exposure control, adjustment of the mA and/or kV according to patient size and/or use of iterative reconstruction technique. COMPARISON:  None. FINDINGS: Lower chest: Lung bases are clear. Hepatobiliary: No focal hepatic lesion. No biliary duct dilatation. Common bile duct is normal. Pancreas: Pancreas is normal. No ductal dilatation. No pancreatic inflammation. Spleen: Normal spleen Adrenals/urinary tract: Adrenal glands and kidneys are normal. The ureters and bladder normal. Stomach/Bowel: Stomach small bowel normal. No small bowel dilatation. Appendix is not identified. There is motion degradation of the imaging which limits fine detail. Moderate volume stool in the ascending and transverse colon. Moderate volume stool in the descending colon. Large volume of stool in the rectal vault. Large stool ball in the rectal vault measures 10.4 x 9.4 x12.5 cm (volume = 640 cm^3). No pneumatosis or portal venous gas.  No intraperitoneal free air. Vascular/Lymphatic: Abdominal aorta is normal caliber with atherosclerotic calcification. There is no retroperitoneal or periportal lymphadenopathy. No pelvic lymphadenopathy. Reproductive: Uterus and adnexa unremarkable. Other: No free fluid. Musculoskeletal: No  aggressive osseous lesion. IMPRESSION: 1. Dominant finding is expansion of the rectal vault by large stool ball. Moderate volume stool in LEFT and RIGHT colon consistent with constipation. 2. No evidence of bowel obstruction. Electronically Signed   By: Suzy Bouchard M.D.   On: 06/07/2021 11:17   CT Head Wo Contrast  Result Date:  06/07/2021 CLINICAL DATA:  Possible fall. EXAM: CT HEAD WITHOUT CONTRAST CT CERVICAL SPINE WITHOUT CONTRAST TECHNIQUE: Multidetector CT imaging of the head and cervical spine was performed following the standard protocol without intravenous contrast. Multiplanar CT image reconstructions of the cervical spine were also generated. RADIATION DOSE REDUCTION: This exam was performed according to the departmental dose-optimization program which includes automated exposure control, adjustment of the mA and/or kV according to patient size and/or use of iterative reconstruction technique. COMPARISON:  CT head dated May 09, 2021. CT head and cervical spine dated April 25, 2021. FINDINGS: CT HEAD FINDINGS Limited by motion artifact. Brain: No evidence of acute infarction, hemorrhage, hydrocephalus, extra-axial collection or mass lesion/mass effect. Stable severe atrophy and moderate chronic microvascular ischemic changes. Vascular: Atherosclerotic vascular calcification of the carotid siphons. No hyperdense vessel. Skull: Normal. Negative for fracture or focal lesion. Sinuses/Orbits: No acute finding. Other: None. CT CERVICAL SPINE FINDINGS Alignment: No traumatic malalignment. Unchanged trace degenerative anterolisthesis at C2-C3, C3-C4, C6-C7, and C7-T1. Skull base and vertebrae: No acute fracture. No primary bone lesion or focal pathologic process. Prior C5-C7 ACDF with incomplete fusion. Soft tissues and spinal canal: No prevertebral fluid or swelling. No visible canal hematoma. Disc levels: Similar severe disc height loss and moderate uncovertebral hypertrophy at C4-C5. Unchanged severe left and moderate right facet arthropathy at C2-C3 and C3-C4. Unchanged moderate bilateral facet arthropathy at C7-T1. Upper chest: Similar scarring at the right lung apex. Other: None. IMPRESSION: 1. Motion limited CT head without definite acute intracranial abnormality. Stable severe atrophy and moderate chronic microvascular  ischemic changes. 2. No acute cervical spine fracture. Electronically Signed   By: Titus Dubin M.D.   On: 06/07/2021 09:21   CT Cervical Spine Wo Contrast  Result Date: 06/07/2021 CLINICAL DATA:  Possible fall. EXAM: CT HEAD WITHOUT CONTRAST CT CERVICAL SPINE WITHOUT CONTRAST TECHNIQUE: Multidetector CT imaging of the head and cervical spine was performed following the standard protocol without intravenous contrast. Multiplanar CT image reconstructions of the cervical spine were also generated. RADIATION DOSE REDUCTION: This exam was performed according to the departmental dose-optimization program which includes automated exposure control, adjustment of the mA and/or kV according to patient size and/or use of iterative reconstruction technique. COMPARISON:  CT head dated May 09, 2021. CT head and cervical spine dated April 25, 2021. FINDINGS: CT HEAD FINDINGS Limited by motion artifact. Brain: No evidence of acute infarction, hemorrhage, hydrocephalus, extra-axial collection or mass lesion/mass effect. Stable severe atrophy and moderate chronic microvascular ischemic changes. Vascular: Atherosclerotic vascular calcification of the carotid siphons. No hyperdense vessel. Skull: Normal. Negative for fracture or focal lesion. Sinuses/Orbits: No acute finding. Other: None. CT CERVICAL SPINE FINDINGS Alignment: No traumatic malalignment. Unchanged trace degenerative anterolisthesis at C2-C3, C3-C4, C6-C7, and C7-T1. Skull base and vertebrae: No acute fracture. No primary bone lesion or focal pathologic process. Prior C5-C7 ACDF with incomplete fusion. Soft tissues and spinal canal: No prevertebral fluid or swelling. No visible canal hematoma. Disc levels: Similar severe disc height loss and moderate uncovertebral hypertrophy at C4-C5. Unchanged severe left and moderate right facet arthropathy at C2-C3 and C3-C4. Unchanged moderate bilateral facet arthropathy at C7-T1. Upper chest: Similar scarring  at the  right lung apex. Other: None. IMPRESSION: 1. Motion limited CT head without definite acute intracranial abnormality. Stable severe atrophy and moderate chronic microvascular ischemic changes. 2. No acute cervical spine fracture. Electronically Signed   By: Titus Dubin M.D.   On: 06/07/2021 09:21   DG Chest Portable 1 View  Result Date: 06/07/2021 CLINICAL DATA:  Fall, initial encounter. EXAM: PORTABLE CHEST 1 VIEW COMPARISON:  09/17/2020. FINDINGS: Trachea is midline. Heart size normal. No airspace consolidation or pleural fluid. Osseous structures appear grossly intact. Midthoracic vertebral body augmentations. IMPRESSION: No acute findings. Electronically Signed   By: Lorin Picket M.D.   On: 06/07/2021 09:24   DG Hip Unilat W or Wo Pelvis 2-3 Views Right  Result Date: 06/07/2021 CLINICAL DATA:  Fall, holding right hip, initial encounter. EXAM: DG HIP (WITH OR WITHOUT PELVIS) 2-3V RIGHT COMPARISON:  09/17/2020. FINDINGS: No fracture or dislocation. Degenerative changes in the visualized spine. Stool is seen throughout the colon with a large amount of stool in the rectum. IMPRESSION: 1. No fracture or dislocation. 2. Probable fecal impaction with constipation. Electronically Signed   By: Lorin Picket M.D.   On: 06/07/2021 09:25     Subjective: Patient seen examined bedside, resting comfortably.  Sodium and renal function now back to normal.  Discharging back to [facility] with PT/OT.  Updated spouse via telephone.  No other questions or concerns at this time.  No acute concerns overnight per nurse staff.  Discharge Exam: Vitals:   06/09/21 2053 06/10/21 0652  BP: 122/77 (!) 102/91  Pulse: 77 64  Resp: 16 16  Temp: 99.1 F (37.3 C) 97.7 F (36.5 C)  SpO2: (!) 85% 100%   Vitals:   06/09/21 0526 06/09/21 1315 06/09/21 2053 06/10/21 0652  BP: 126/81 (!) 121/57 122/77 (!) 102/91  Pulse: 62 62 77 64  Resp: 18 15 16 16   Temp: 98 F (36.7 C) 97.7 F (36.5 C) 99.1 F (37.3  C) 97.7 F (36.5 C)  TempSrc:  Oral Axillary Oral  SpO2: 99% (!) 79% (!) 85% 100%  Weight:      Height:        General: Pt is alert, awake, not in acute distress, pleasantly confused Cardiovascular: RRR, S1/S2 +, no rubs, no gallops Respiratory: CTA bilaterally, no wheezing, no rhonchi, on room air Abdominal: Soft, NT, ND, bowel sounds + Extremities: no edema, no cyanosis    The results of significant diagnostics from this hospitalization (including imaging, microbiology, ancillary and laboratory) are listed below for reference.     Microbiology: Recent Results (from the past 240 hour(s))  Urine Culture     Status: Abnormal   Collection Time: 06/07/21  1:42 PM   Specimen: Urine, Catheterized  Result Value Ref Range Status   Specimen Description   Final    URINE, CATHETERIZED Performed at [facility] [address], [place], [place] [address]    Special Requests   Final    NONE Performed at [facility], [place] [address] [place], [place] [address]    Culture >=100,000 COLONIES/mL PROTEUS MIRABILIS (A)  Final   Report Status 06/09/2021 FINAL  Final   Organism ID, Bacteria PROTEUS MIRABILIS (A)  Final      Susceptibility   Proteus mirabilis - MIC*    AMPICILLIN <=2 SENSITIVE Sensitive     CEFAZOLIN <=4 SENSITIVE Sensitive     CEFEPIME <=0.12 SENSITIVE Sensitive     CEFTRIAXONE <=0.25 SENSITIVE Sensitive  CIPROFLOXACIN <=0.25 SENSITIVE Sensitive     GENTAMICIN <=1 SENSITIVE Sensitive     IMIPENEM 2 SENSITIVE Sensitive     NITROFURANTOIN 128 RESISTANT Resistant     TRIMETH/SULFA <=20 SENSITIVE Sensitive     AMPICILLIN/SULBACTAM <=2 SENSITIVE Sensitive     PIP/TAZO <=4 SENSITIVE Sensitive     * >=100,000 COLONIES/mL PROTEUS MIRABILIS  Resp Panel by RT-PCR (Flu A&B, Covid) Nasopharyngeal Swab     Status: None   Collection Time: 06/07/21  4:56 PM   Specimen: Nasopharyngeal Swab; Nasopharyngeal(NP) swabs in vial transport medium   Result Value Ref Range Status   SARS Coronavirus 2 by RT PCR NEGATIVE NEGATIVE Final    Comment: (NOTE) SARS-CoV-2 target nucleic acids are NOT DETECTED.  The SARS-CoV-2 RNA is generally detectable in upper respiratory specimens during the acute phase of infection. The lowest concentration of SARS-CoV-2 viral copies this assay can detect is 138 copies/mL. A negative result does not preclude SARS-Cov-2 infection and should not be used as the sole basis for treatment or other patient management decisions. A negative result may occur with  improper specimen collection/handling, submission of specimen other than nasopharyngeal swab, presence of viral mutation(s) within the areas targeted by this assay, and inadequate number of viral copies(<138 copies/mL). A negative result must be combined with clinical observations, patient history, and epidemiological information. The expected result is Negative.  Fact Sheet for Patients:  BloggerCourse.com  Fact Sheet for Healthcare Providers:  SeriousBroker.it  This test is no t yet approved or cleared by the [place] FDA and  has been authorized for detection and/or diagnosis of SARS-CoV-2 by FDA under an Emergency Use Authorization (EUA). This EUA will remain  in effect (meaning this test can be used) for the duration of the COVID-19 declaration under Section 564(b)(1) of the Act, 21 U.S.C.section 360bbb-3(b)(1), unless the authorization is terminated  or revoked sooner.       Influenza A by PCR NEGATIVE NEGATIVE Final   Influenza B by PCR NEGATIVE NEGATIVE Final    Comment: (NOTE) The Xpert Xpress SARS-CoV-2/FLU/RSV plus assay is intended as an aid in the diagnosis of influenza from Nasopharyngeal swab specimens and should not be used as a sole basis for treatment. Nasal washings and aspirates are unacceptable for Xpert Xpress SARS-CoV-2/FLU/RSV testing.  Fact Sheet for  Patients: BloggerCourse.com  Fact Sheet for Healthcare Providers: SeriousBroker.it  This test is not yet approved or cleared by the [place] FDA and has been authorized for detection and/or diagnosis of SARS-CoV-2 by FDA under an Emergency Use Authorization (EUA). This EUA will remain in effect (meaning this test can be used) for the duration of the COVID-19 declaration under Section 564(b)(1) of the Act, 21 U.S.C. section 360bbb-3(b)(1), unless the authorization is terminated or revoked.  Performed at [facility], [address], [place], [place] [address]      Labs: BNP (last 3 results) No results for input(s): BNP in the last 8760 hours. Basic Metabolic Panel: Recent Labs  Lab 06/07/21 1008 06/07/21 1300 06/07/21 2207 06/08/21 0047 06/08/21 1717 06/09/21 0342 06/10/21 0323  NA 163*   < > 155* 153* 150* 145 142  K 4.2  --   --  3.6 3.7 3.5 3.6  CL 130*  --   --  126* 118* 115* 111  CO2 26  --   --  21* 23 24 21*  GLUCOSE 123*  --   --  104* 118* 106* 99  BUN 76*  --   --  59* 48* 45* 30*  CREATININE 1.51*  --   --  1.12* 1.13* 0.99 0.75  CALCIUM 9.3  --   --  8.6* 8.4* 8.5* 8.5*  MG  --   --   --   --   --  2.7* 2.6*  PHOS  --   --   --   --   --  2.8  --    < > = values in this interval not displayed.   Liver Function Tests: Recent Labs  Lab 06/07/21 1008 06/08/21 0047  AST 30 27  ALT 54* 44  ALKPHOS 116 106  BILITOT 0.9 0.8  PROT 7.7 6.6  ALBUMIN 4.5 3.8   No results for input(s): LIPASE, AMYLASE in the last 168 hours. No results for input(s): AMMONIA in the last 168 hours. CBC: Recent Labs  Lab 06/07/21 1008 06/08/21 0047 06/09/21 0342  WBC 11.7* 12.0* 8.2  NEUTROABS 8.7*  --   --   HGB 14.7 13.4 12.6  HCT 47.6* 43.2 39.0  MCV 100.0 99.8 97.0  PLT 272 247 201   Cardiac Enzymes: No results for input(s): CKTOTAL, CKMB, CKMBINDEX, TROPONINI in the last 168  hours. BNP: Invalid input(s): POCBNP CBG: No results for input(s): GLUCAP in the last 168 hours. D-Dimer No results for input(s): DDIMER in the last 72 hours. Hgb A1c No results for input(s): HGBA1C in the last 72 hours. Lipid Profile No results for input(s): CHOL, HDL, LDLCALC, TRIG, CHOLHDL, LDLDIRECT in the last 72 hours. Thyroid function studies No results for input(s): TSH, T4TOTAL, T3FREE, THYROIDAB in the last 72 hours.  Invalid input(s): FREET3 Anemia work up No results for input(s): VITAMINB12, FOLATE, FERRITIN, TIBC, IRON, RETICCTPCT in the last 72 hours. Urinalysis    Component Value Date/Time   COLORURINE YELLOW 06/07/2021 0937   APPEARANCEUR CLOUDY (A) 06/07/2021 0937   LABSPEC 1.018 06/07/2021 0937   PHURINE 7.0 06/07/2021 0937   GLUCOSEU NEGATIVE 06/07/2021 0937   HGBUR SMALL (A) 06/07/2021 0937   BILIRUBINUR NEGATIVE 06/07/2021 0937   KETONESUR NEGATIVE 06/07/2021 0937   PROTEINUR 100 (A) 06/07/2021 0937   NITRITE POSITIVE (A) 06/07/2021 0937   LEUKOCYTESUR LARGE (A) 06/07/2021 0937   Sepsis Labs Invalid input(s): PROCALCITONIN,  WBC,  LACTICIDVEN Microbiology Recent Results (from the past 240 hour(s))  Urine Culture     Status: Abnormal   Collection Time: 06/07/21  1:42 PM   Specimen: Urine, Catheterized  Result Value Ref Range Status   Specimen Description   Final    URINE, CATHETERIZED Performed at [facility], [place] [address] [place], [place] [address]    Special Requests   Final    NONE Performed at [facility], [place] [address] [place], [place] [address]    Culture >=100,000 COLONIES/mL PROTEUS MIRABILIS (A)  Final   Report Status 06/09/2021 FINAL  Final   Organism ID, Bacteria PROTEUS MIRABILIS (A)  Final      Susceptibility   Proteus mirabilis - MIC*    AMPICILLIN <=2 SENSITIVE Sensitive     CEFAZOLIN <=4 SENSITIVE Sensitive     CEFEPIME <=0.12 SENSITIVE Sensitive     CEFTRIAXONE <=0.25 SENSITIVE  Sensitive     CIPROFLOXACIN <=0.25 SENSITIVE Sensitive     GENTAMICIN <=1 SENSITIVE Sensitive     IMIPENEM 2 SENSITIVE Sensitive     NITROFURANTOIN 128 RESISTANT Resistant     TRIMETH/SULFA <=20 SENSITIVE Sensitive     AMPICILLIN/SULBACTAM <=2 SENSITIVE Sensitive     PIP/TAZO <=4 SENSITIVE  Sensitive     * >=100,000 COLONIES/mL PROTEUS MIRABILIS  Resp Panel by RT-PCR (Flu A&B, Covid) Nasopharyngeal Swab     Status: None   Collection Time: 06/07/21  4:56 PM   Specimen: Nasopharyngeal Swab; Nasopharyngeal(NP) swabs in vial transport medium  Result Value Ref Range Status   SARS Coronavirus 2 by RT PCR NEGATIVE NEGATIVE Final    Comment: (NOTE) SARS-CoV-2 target nucleic acids are NOT DETECTED.  The SARS-CoV-2 RNA is generally detectable in upper respiratory specimens during the acute phase of infection. The lowest concentration of SARS-CoV-2 viral copies this assay can detect is 138 copies/mL. A negative result does not preclude SARS-Cov-2 infection and should not be used as the sole basis for treatment or other patient management decisions. A negative result may occur with  improper specimen collection/handling, submission of specimen other than nasopharyngeal swab, presence of viral mutation(s) within the areas targeted by this assay, and inadequate number of viral copies(<138 copies/mL). A negative result must be combined with clinical observations, patient history, and epidemiological information. The expected result is Negative.  Fact Sheet for Patients:  EntrepreneurPulse.com.au  Fact Sheet for Healthcare Providers:  IncredibleEmployment.be  This test is no t yet approved or cleared by the [place] FDA and  has been authorized for detection and/or diagnosis of SARS-CoV-2 by FDA under an Emergency Use Authorization (EUA). This EUA will remain  in effect (meaning this test can be used) for the duration of the COVID-19 declaration under  Section 564(b)(1) of the Act, 21 U.S.C.section 360bbb-3(b)(1), unless the authorization is terminated  or revoked sooner.       Influenza A by PCR NEGATIVE NEGATIVE Final   Influenza B by PCR NEGATIVE NEGATIVE Final    Comment: (NOTE) The Xpert Xpress SARS-CoV-2/FLU/RSV plus assay is intended as an aid in the diagnosis of influenza from Nasopharyngeal swab specimens and should not be used as a sole basis for treatment. Nasal washings and aspirates are unacceptable for Xpert Xpress SARS-CoV-2/FLU/RSV testing.  Fact Sheet for Patients: EntrepreneurPulse.com.au  Fact Sheet for Healthcare Providers: IncredibleEmployment.be  This test is not yet approved or cleared by the [place] FDA and has been authorized for detection and/or diagnosis of SARS-CoV-2 by FDA under an Emergency Use Authorization (EUA). This EUA will remain in effect (meaning this test can be used) for the duration of the COVID-19 declaration under Section 564(b)(1) of the Act, 21 U.S.C. section 360bbb-3(b)(1), unless the authorization is terminated or revoked.  Performed at [facility], [place] [address], [place], [place] [address]      Time coordinating discharge: Over 30 minutes  SIGNED:   Jeyson Deshotel J [place] DO  [facility] 06/10/2021, 11:48 AM

## 2021-06-10 NOTE — NC FL2 (Signed)
°  [place] LEVEL OF CARE SCREENING TOOL     IDENTIFICATION  Patient Name: [name] Birthdate: 08-06-1940 Sex: female Admission Date (Current Location): 06/07/2021  [facility] and [place] Number:  Herbalist and Address:  [facility],  [place], [place]      Provider Number: [id number]  Attending Physician Name and Address:  [place] Donnamarie Poag, DO  Relative Name and Phone Number:       Current Level of Care: Hospital Recommended Level of Care: Memory Care Prior Approval Number:    Date Approved/Denied:   PASRR Number: [id number]  Discharge Plan: Other (Comment) (memory care)    Current Diagnoses: Patient Active Problem List   Diagnosis Date Noted   Pure hypercholesterolemia 05/14/2018   Hyperthyroidism 03/29/2018   Weight loss 03/29/2018   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   Alzheimer's disease ([place]) 12/31/2012   Other vitamin B12 deficiency anemia 12/31/2012    Orientation RESPIRATION BLADDER Height & Weight        Normal Continent Weight: 47.6 kg Height:  5\' 1"  (154.9 cm)  BEHAVIORAL SYMPTOMS/MOOD NEUROLOGICAL BOWEL NUTRITION STATUS      Continent Diet (finger foods)  AMBULATORY STATUS COMMUNICATION OF NEEDS Skin   Extensive Assist Verbally Normal                       Personal Care Assistance Level of Assistance  Bathing, Feeding, Dressing Bathing Assistance: Maximum assistance Feeding assistance: Maximum assistance Dressing Assistance: Maximum assistance     Functional Limitations Info  Sight, Hearing, Speech Sight Info: Adequate Hearing Info: Adequate Speech Info: Adequate    SPECIAL CARE FACTORS FREQUENCY  PT (By licensed PT), OT (By licensed OT)     PT Frequency: weekly as tolerated OT Frequency: weekly as tolerated            Contractures Contractures Info: Not present    Additional Factors Info  Code Status Code Status Info: full             Current  Medications (06/10/2021):  This is the current hospital active medication list Current Facility-Administered Medications  Medication Dose Route Frequency Provider Last Rate Last Admin   acetaminophen (TYLENOL) tablet 650 mg  650 mg Oral Q6H PRN Reubin Milan, MD   650 mg at 06/10/21 1005   Or   acetaminophen (TYLENOL) suppository 650 mg  650 mg Rectal Q6H PRN Reubin Milan, MD   650 mg at 06/08/21 1033   enoxaparin (LOVENOX) injection 40 mg  40 mg Subcutaneous Q24H Poindexter, Leann T, RPH   40 mg at 06/09/21 2118   ondansetron (ZOFRAN) tablet 4 mg  4 mg Oral Q6H PRN Reubin Milan, MD       Or   ondansetron [facility]) injection 4 mg  4 mg Intravenous Q6H PRN Reubin Milan, MD         Discharge Medications: Please see discharge summary for a list of discharge medications.  Relevant Imaging Results:  Relevant Lab Results:   Additional Information [id number]  Leeroy Cha, RN

## 2021-06-10 NOTE — Progress Notes (Signed)
Picked up by [facility] at 2052 in stable condition. Unable to obtain accurate vital signs because patient was restless after moving to the stretcher.  Prior vital signs ok.

## 2022-10-22 DEATH — deceased
